# Patient Record
Sex: Male | Born: 1950 | Race: White | Hispanic: No | Marital: Married | Smoking: Never smoker
Health system: Southern US, Community
[De-identification: ages and names within clinical notes are randomized; demographics above are authoritative.]

## PROBLEM LIST (undated history)

## (undated) DIAGNOSIS — Z951 Presence of aortocoronary bypass graft: Secondary | ICD-10-CM

## (undated) DIAGNOSIS — D696 Thrombocytopenia, unspecified: Secondary | ICD-10-CM

## (undated) DIAGNOSIS — T4145XA Adverse effect of unspecified anesthetic, initial encounter: Secondary | ICD-10-CM

## (undated) DIAGNOSIS — M797 Fibromyalgia: Secondary | ICD-10-CM

## (undated) DIAGNOSIS — N400 Enlarged prostate without lower urinary tract symptoms: Secondary | ICD-10-CM

## (undated) DIAGNOSIS — G4730 Sleep apnea, unspecified: Secondary | ICD-10-CM

## (undated) DIAGNOSIS — I513 Intracardiac thrombosis, not elsewhere classified: Secondary | ICD-10-CM

## (undated) DIAGNOSIS — I10 Essential (primary) hypertension: Secondary | ICD-10-CM

## (undated) DIAGNOSIS — Z789 Other specified health status: Secondary | ICD-10-CM

## (undated) DIAGNOSIS — I509 Heart failure, unspecified: Secondary | ICD-10-CM

## (undated) DIAGNOSIS — I219 Acute myocardial infarction, unspecified: Secondary | ICD-10-CM

## (undated) DIAGNOSIS — E785 Hyperlipidemia, unspecified: Secondary | ICD-10-CM

## (undated) DIAGNOSIS — Z95810 Presence of automatic (implantable) cardiac defibrillator: Secondary | ICD-10-CM

## (undated) DIAGNOSIS — I2510 Atherosclerotic heart disease of native coronary artery without angina pectoris: Secondary | ICD-10-CM

## (undated) DIAGNOSIS — T8859XA Other complications of anesthesia, initial encounter: Secondary | ICD-10-CM

## (undated) DIAGNOSIS — I255 Ischemic cardiomyopathy: Secondary | ICD-10-CM

## (undated) DIAGNOSIS — I240 Acute coronary thrombosis not resulting in myocardial infarction: Secondary | ICD-10-CM

## (undated) HISTORY — DX: Benign prostatic hyperplasia without lower urinary tract symptoms: N40.0

## (undated) HISTORY — DX: Intracardiac thrombosis, not elsewhere classified: I51.3

## (undated) HISTORY — DX: Thrombocytopenia, unspecified: D69.6

## (undated) HISTORY — DX: Presence of automatic (implantable) cardiac defibrillator: Z95.810

## (undated) HISTORY — DX: Other specified health status: Z78.9

## (undated) HISTORY — PX: JOINT REPLACEMENT: SHX530

## (undated) HISTORY — PX: APPENDECTOMY: SHX54

---

## 1993-05-02 HISTORY — PX: OTHER SURGICAL HISTORY: SHX169

## 1999-04-16 ENCOUNTER — Encounter: Payer: Self-pay | Admitting: Neurological Surgery

## 1999-04-16 ENCOUNTER — Ambulatory Visit (HOSPITAL_COMMUNITY): Admission: RE | Admit: 1999-04-16 | Discharge: 1999-04-16 | Payer: Self-pay | Admitting: Neurological Surgery

## 2001-01-25 ENCOUNTER — Ambulatory Visit (HOSPITAL_BASED_OUTPATIENT_CLINIC_OR_DEPARTMENT_OTHER): Admission: RE | Admit: 2001-01-25 | Discharge: 2001-01-25 | Payer: Self-pay | Admitting: *Deleted

## 2001-05-02 HISTORY — PX: SPINAL FUSION: SHX223

## 2005-05-02 HISTORY — PX: CORONARY ARTERY BYPASS GRAFT: SHX141

## 2005-07-24 ENCOUNTER — Inpatient Hospital Stay (HOSPITAL_COMMUNITY): Admission: AD | Admit: 2005-07-24 | Discharge: 2005-08-03 | Payer: Self-pay | Admitting: Cardiology

## 2005-07-24 ENCOUNTER — Ambulatory Visit: Payer: Self-pay | Admitting: Cardiology

## 2005-07-26 ENCOUNTER — Encounter: Payer: Self-pay | Admitting: Vascular Surgery

## 2005-07-29 ENCOUNTER — Encounter: Payer: Self-pay | Admitting: Cardiology

## 2005-08-04 ENCOUNTER — Emergency Department (HOSPITAL_COMMUNITY): Admission: EM | Admit: 2005-08-04 | Discharge: 2005-08-04 | Payer: Self-pay | Admitting: Emergency Medicine

## 2005-08-11 ENCOUNTER — Ambulatory Visit: Payer: Self-pay | Admitting: Cardiology

## 2005-08-25 ENCOUNTER — Encounter (HOSPITAL_COMMUNITY): Admission: RE | Admit: 2005-08-25 | Discharge: 2005-11-23 | Payer: Self-pay | Admitting: Cardiology

## 2005-09-09 ENCOUNTER — Ambulatory Visit: Payer: Self-pay | Admitting: Cardiology

## 2005-09-20 ENCOUNTER — Ambulatory Visit: Payer: Self-pay | Admitting: *Deleted

## 2005-10-03 ENCOUNTER — Ambulatory Visit: Payer: Self-pay | Admitting: Cardiology

## 2005-10-04 ENCOUNTER — Emergency Department (HOSPITAL_COMMUNITY): Admission: EM | Admit: 2005-10-04 | Discharge: 2005-10-05 | Payer: Self-pay | Admitting: Emergency Medicine

## 2005-10-05 ENCOUNTER — Ambulatory Visit: Payer: Self-pay | Admitting: Cardiology

## 2005-10-18 ENCOUNTER — Ambulatory Visit: Payer: Self-pay | Admitting: Cardiology

## 2005-11-24 ENCOUNTER — Encounter (HOSPITAL_COMMUNITY): Admission: RE | Admit: 2005-11-24 | Discharge: 2006-01-10 | Payer: Self-pay | Admitting: Cardiology

## 2005-12-05 ENCOUNTER — Ambulatory Visit: Payer: Self-pay | Admitting: Cardiology

## 2006-04-12 ENCOUNTER — Ambulatory Visit: Payer: Self-pay | Admitting: *Deleted

## 2006-04-21 ENCOUNTER — Ambulatory Visit: Payer: Self-pay

## 2006-05-03 ENCOUNTER — Ambulatory Visit: Payer: Self-pay | Admitting: *Deleted

## 2006-07-03 ENCOUNTER — Ambulatory Visit: Payer: Self-pay | Admitting: Cardiology

## 2006-07-03 LAB — CONVERTED CEMR LAB
Cholesterol: 178 mg/dL (ref 0–200)
Direct LDL: 99.7 mg/dL
Total CHOL/HDL Ratio: 5.7
Triglycerides: 252 mg/dL (ref 0–149)
VLDL: 50 mg/dL — ABNORMAL HIGH (ref 0–40)

## 2006-07-07 ENCOUNTER — Ambulatory Visit: Payer: Self-pay | Admitting: Cardiology

## 2006-11-01 ENCOUNTER — Ambulatory Visit: Payer: Self-pay | Admitting: Cardiology

## 2006-11-01 LAB — CONVERTED CEMR LAB
ALT: 17 units/L (ref 0–53)
Cholesterol: 222 mg/dL (ref 0–200)
Direct LDL: 92.3 mg/dL
HDL: 30 mg/dL — ABNORMAL LOW (ref >39.0)
Total Bilirubin: 0.7 mg/dL (ref 0.3–1.2)
Total CHOL/HDL Ratio: 7.4
Total Protein: 7.3 g/dL (ref 6.0–8.3)
VLDL: 97 mg/dL — ABNORMAL HIGH (ref 0–40)

## 2006-11-08 ENCOUNTER — Ambulatory Visit: Payer: Self-pay | Admitting: Cardiology

## 2009-01-29 ENCOUNTER — Telehealth (INDEPENDENT_AMBULATORY_CARE_PROVIDER_SITE_OTHER): Payer: Self-pay | Admitting: *Deleted

## 2010-05-02 HISTORY — PX: CARDIAC CATHETERIZATION: SHX172

## 2010-05-02 HISTORY — PX: ICD IMPLANT: EP1208

## 2010-05-23 ENCOUNTER — Encounter: Payer: Self-pay | Admitting: Thoracic Surgery (Cardiothoracic Vascular Surgery)

## 2010-07-07 ENCOUNTER — Inpatient Hospital Stay (HOSPITAL_COMMUNITY)
Admission: EM | Admit: 2010-07-07 | Discharge: 2010-07-16 | DRG: 853 | Disposition: A | Discharge status: Home / Self Care | Payer: BC Managed Care – PPO | Source: Ambulatory Visit | Attending: Cardiology | Admitting: Cardiology

## 2010-07-07 ENCOUNTER — Emergency Department (HOSPITAL_COMMUNITY): Admission: EM | Admit: 2010-07-07 | Payer: BC Managed Care – PPO | Source: Home / Self Care

## 2010-07-07 DIAGNOSIS — E785 Hyperlipidemia, unspecified: Secondary | ICD-10-CM | POA: Diagnosis present

## 2010-07-07 DIAGNOSIS — Z7901 Long term (current) use of anticoagulants: Secondary | ICD-10-CM

## 2010-07-07 DIAGNOSIS — G4730 Sleep apnea, unspecified: Secondary | ICD-10-CM | POA: Diagnosis present

## 2010-07-07 DIAGNOSIS — I2582 Chronic total occlusion of coronary artery: Secondary | ICD-10-CM | POA: Diagnosis present

## 2010-07-07 DIAGNOSIS — I2589 Other forms of chronic ischemic heart disease: Secondary | ICD-10-CM | POA: Diagnosis present

## 2010-07-07 DIAGNOSIS — IMO0001 Reserved for inherently not codable concepts without codable children: Secondary | ICD-10-CM | POA: Diagnosis present

## 2010-07-07 DIAGNOSIS — I214 Non-ST elevation (NSTEMI) myocardial infarction: Principal | ICD-10-CM | POA: Diagnosis present

## 2010-07-07 DIAGNOSIS — IMO0002 Reserved for concepts with insufficient information to code with codable children: Secondary | ICD-10-CM | POA: Diagnosis present

## 2010-07-07 DIAGNOSIS — I2510 Atherosclerotic heart disease of native coronary artery without angina pectoris: Secondary | ICD-10-CM | POA: Diagnosis present

## 2010-07-07 DIAGNOSIS — Z951 Presence of aortocoronary bypass graft: Secondary | ICD-10-CM

## 2010-07-07 LAB — BASIC METABOLIC PANEL
Calcium: 8.3 mg/dL — ABNORMAL LOW (ref 8.4–10.5)
Chloride: 110 mEq/L (ref 96–112)
Creatinine, Ser: 0.72 mg/dL (ref 0.4–1.5)
GFR calc Af Amer: >60 mL/min (ref >60)
Sodium: 135 mEq/L (ref 135–145)

## 2010-07-07 LAB — COMPREHENSIVE METABOLIC PANEL
ALT: 15 U/L (ref 0–53)
Alkaline Phosphatase: 92 U/L (ref 39–117)
BUN: 6 mg/dL (ref 6–23)
Calcium: 8.2 mg/dL — ABNORMAL LOW (ref 8.4–10.5)
Chloride: 108 mEq/L (ref 96–112)
Creatinine, Ser: 0.82 mg/dL (ref 0.4–1.5)
GFR calc non Af Amer: >60 mL/min (ref >60)
Glucose, Bld: 116 mg/dL — ABNORMAL HIGH (ref 70–99)

## 2010-07-07 LAB — CARDIAC PANEL(CRET KIN+CKTOT+MB+TROPI)
CK, MB: 6.7 ng/mL (ref 0.3–4.0)
Relative Index: 5.6 — ABNORMAL HIGH (ref 0.0–2.5)

## 2010-07-07 LAB — POCT ACTIVATED CLOTTING TIME
Activated Clotting Time: 376 seconds
Activated Clotting Time: 164 seconds

## 2010-07-07 LAB — MRSA PCR SCREENING: MRSA by PCR: NEGATIVE

## 2010-07-07 LAB — CBC
HCT: 54.3 % — ABNORMAL HIGH (ref 39.0–52.0)
MCH: 29.6 pg (ref 26.0–34.0)
MCH: 30 pg (ref 26.0–34.0)
MCHC: 35.1 g/dL (ref 30.0–36.0)
MCV: 85.4 fL (ref 78.0–100.0)
MCV: 86.5 fL (ref 78.0–100.0)
Platelets: 158 10*3/uL (ref 150–400)
RDW: 14.5 % (ref 11.5–15.5)
RDW: 14.6 % (ref 11.5–15.5)
WBC: 6.2 10*3/uL (ref 4.0–10.5)

## 2010-07-07 LAB — LIPID PANEL
LDL Cholesterol: 104 mg/dL — ABNORMAL HIGH (ref 0–99)
VLDL: 35 mg/dL (ref 0–40)

## 2010-07-07 LAB — APTT: aPTT: 41 seconds — ABNORMAL HIGH (ref 24–37)

## 2010-07-07 LAB — PROTIME-INR
INR: 0.93 (ref 0.00–1.49)
INR: 1.04 (ref 0.00–1.49)

## 2010-07-07 LAB — MAGNESIUM: Magnesium: 2.2 mg/dL (ref 1.5–2.5)

## 2010-07-08 LAB — CARDIAC PANEL(CRET KIN+CKTOT+MB+TROPI)
CK, MB: 247.4 ng/mL (ref 0.3–4.0)
Relative Index: 10.3 — ABNORMAL HIGH (ref 0.0–2.5)
Total CK: 2406 U/L — ABNORMAL HIGH (ref 7–232)
Total CK: 1831 U/L — ABNORMAL HIGH (ref 7–232)
Troponin I: >100 ng/mL (ref 0.00–0.06)
Troponin I: 88.75 ng/mL (ref 0.00–0.06)
Troponin I: 46.79 ng/mL (ref 0.00–0.06)

## 2010-07-08 LAB — BASIC METABOLIC PANEL
BUN: 5 mg/dL — ABNORMAL LOW (ref 6–23)
Calcium: 8.5 mg/dL (ref 8.4–10.5)
Chloride: 107 mEq/L (ref 96–112)
Creatinine, Ser: 0.79 mg/dL (ref 0.4–1.5)
GFR calc Af Amer: >60 mL/min (ref >60)
Potassium: 3.7 mEq/L (ref 3.5–5.1)
Sodium: 137 mEq/L (ref 135–145)

## 2010-07-08 LAB — CBC
MCHC: 34.7 g/dL (ref 30.0–36.0)
MCV: 85.7 fL (ref 78.0–100.0)
RDW: 14.6 % (ref 11.5–15.5)

## 2010-07-08 LAB — POCT I-STAT, CHEM 8
Calcium, Ion: 1.03 mmol/L — ABNORMAL LOW (ref 1.12–1.32)
Chloride: 105 mEq/L (ref 96–112)
Glucose, Bld: 108 mg/dL — ABNORMAL HIGH (ref 70–99)
HCT: 58 % — ABNORMAL HIGH (ref 39.0–52.0)
Hemoglobin: 19.7 g/dL — ABNORMAL HIGH (ref 13.0–17.0)
TCO2: 22 mmol/L (ref 0–100)

## 2010-07-08 LAB — POCT CARDIAC MARKERS
CKMB, poc: 2.6 ng/mL (ref 1.0–8.0)
Myoglobin, poc: 224 ng/mL (ref 12–200)
Troponin i, poc: 0.06 ng/mL (ref 0.00–0.09)

## 2010-07-08 LAB — TSH: TSH: 1.377 u[IU]/mL (ref 0.350–4.500)

## 2010-07-08 LAB — MAGNESIUM: Magnesium: 2.2 mg/dL (ref 1.5–2.5)

## 2010-07-09 LAB — CBC
HCT: 47 % (ref 39.0–52.0)
Hemoglobin: 15.9 g/dL (ref 13.0–17.0)
MCH: 29.3 pg (ref 26.0–34.0)
MCHC: 33.8 g/dL (ref 30.0–36.0)
MCV: 86.7 fL (ref 78.0–100.0)
RDW: 14.7 % (ref 11.5–15.5)

## 2010-07-09 LAB — BASIC METABOLIC PANEL
CO2: 26 mEq/L (ref 19–32)
Calcium: 8.2 mg/dL — ABNORMAL LOW (ref 8.4–10.5)
Chloride: 105 mEq/L (ref 96–112)
Creatinine, Ser: 1.04 mg/dL (ref 0.4–1.5)
GFR calc Af Amer: >60 mL/min (ref >60)
Sodium: 137 mEq/L (ref 135–145)

## 2010-07-09 LAB — GLUCOSE, CAPILLARY: Glucose-Capillary: 82 mg/dL (ref 70–99)

## 2010-07-09 LAB — PROTIME-INR: INR: 1 (ref 0.00–1.49)

## 2010-07-09 NOTE — Procedures (Signed)
NAME:  [name] Hall, [name] Hall              ACCOUNT NO.:  [id number]  MEDICAL RECORD NO.:  [id number]           PATIENT TYPE:  I  LOCATION:  2907                         FACILITY:  [facility]  PHYSICIAN:  Thereasa Solo. Brittnay Pigman, M.D. DATE OF BIRTH:  [birth year]  DATE OF PROCEDURE:  07/07/2010 DATE OF DISCHARGE:                           CARDIAC CATHETERIZATION   INDICATIONS FOR TEST:  This 60 year old male who had had bypass surgery in 2007.  He began having severe chest pain around 11:45 a.m.  He presented to the emergency room at 1304.  His first EKG was done at 1340.  STEMI was called at 1348.  He was in the cath lab at 1357.  Wire was passed through the lesion at 1447 and balloon inflation at 1451.  This patient had difficult anatomy.  He had a high-grade lesion in the circumflex and a totally occluded LAD.  His EKG shows changes of an inferior lateral infarct.  It was difficult to find to ascertain which one was truly the culprit vessel.  An opinion from Dr. Consuelo Pandy was obtained.  At the time of his bypass surgery, he already had a totally occluded right coronary artery and he had a non-grafted circumflex.  I did not perform a ventriculogram in attempt to conserve contrast exposure.  COMPLICATIONS:  None.  EQUIPMENT:  A 6-French Judkins configuration catheters.  DESCRIPTION OF PROCEDURE:  The patient was prepped and draped in the usual sterile fashion, exposing the right groin.  Following local anesthetic with 1% Xylocaine, the Seldinger technique was employed and a 6-French introducer sheath was placed in the right femoral artery.  Left and right coronary arteriography and graft visualization was performed.  Because of disease in the circumflex and disease in the LAD, it was difficult to ascertain which area was responsible for the inferior lateral infarct.  It was finally decided that it was the LAD.  An internal mammary artery catheter was used to cannulate the internal mammary  artery and a RCB guide was used to cannulate the right vein graft.  RESULTS: 1. Hemodynamic monitoring.  Central aortic pressure was 141/87. 2. Coronary arteriography:  Left main was normal. 3. Circumflex.  A non-grafted vessel with mid 90% 12-mm long area of     narrowing and the vessel was only about to advance the 2-5 in     diameter. 4. LAD.  100% occluded in its midportion.  It was grafted. 5. Right coronary artery.  100% occluded very proximally and it was     grafted.  GRAFTS: 1. The graft to the diagonal was widely patent.  The diagonal was well     visualized and free of disease. 2. Vein graft to the RCA.  The graft was widely patent.  Both the PDA     and posterior lateral vessels were free of disease but the PDA was     relatively small. 3. Internal mammary artery to the LAD.  The internal mammary artery     was widely patent on first view which was a cusp injection, you     could see distal runoff but in reality runoff was small diagonal  branch.  When selective engagement was performed, you could see     that the LAD was 100% occluded distal to its anastomotic site and     distal to the small diagonal that comes off.  On the second     injection, the frank trickle slowed down what looked like a very     small LAD. 4. PCI with both high-grade stenosis in the LAD and in the circumflex     system, it was Genasis Zingale difficult to decide which vessel was truly     culprit.  The decision was finally made after discussing with Dr.     Consuelo Pandy to proceed on with emergency intervention to the LAD.  A 6-     [place] IM guide catheters used and a short Luge wire.  The patient     was given IV Angiomax and at the end of the procedure, received 60     mg of p.o. Effient.  A short Luge wire was placed down the internal     mammary artery well distal to the area of obstruction in the LAD     and there was actually flow down the LAD once our wire was placed.  Predilatation was  accomplished with a 2.0 x 15 Apex balloon, 2 inflations, 10 atmospheres for 32 seconds and 15 atmospheres for 32 seconds.  Stenting with a drug-eluting 2.25 x 20 stent was performed, making sure that both the proximal and distal segments of the obstructions were well covered, 15 atmospheres for 37 seconds and 16 atmospheres for 37 seconds.  Postdilatation using the [place] Quantum balloon 2.5 x 20 was performed at 14 atmospheres for 42 seconds.  The area that had been basically occluded preintervention now appeared to be normal with brisk TIMI 3 flow, previously TIMI 0 to TIMI 1.  At the stent site, everything was widely patent and normal, but the ongoing LAD and particularly the LAD traverse the apex of the heart.  It was diffusely diseased and small.  I did not feel that additional stenting in the most terminal portion of the LAD was that the patient's best of interest.  I was concerned of potential perforation.  I placed him on Integrilin for 18 hours following this intervention to make sure there is no residual reduction in distal runoff that could be related to thrombus.  He had not been on aspirin prior to his presentation.  He will need an echo to assess his LV function and he will need intervention to his circumflex prior to discharge.  I will start him on statin therapy, beta-blockers, aspirin, Effient, and he will be started on ACE inhibitors tomorrow if his BUN and creatinine remain normal.  I had a long and somewhat blunt discussion about the importance of taking his medications in the setting of a C-reactive protein that he knows is over 10 and having had bypass surgery.          ______________________________ Thereasa Solo Kamile Fassler, M.D.     ABL/MEDQ  D:  07/07/2010  T:  07/08/2010  Job:  [id number]  cc:   Nanetta Batty, M.D. Vianne Bulls, M.D.  Electronically Signed by Julieanne Manson M.D. on 07/09/2010 02:26:56 PM

## 2010-07-11 LAB — CBC
Hemoglobin: 15.1 g/dL (ref 13.0–17.0)
MCH: 30.2 pg (ref 26.0–34.0)
Platelets: 125 10*3/uL — ABNORMAL LOW (ref 150–400)
RBC: 5 MIL/uL (ref 4.22–5.81)
WBC: 6.9 10*3/uL (ref 4.0–10.5)

## 2010-07-11 LAB — BASIC METABOLIC PANEL
CO2: 27 mEq/L (ref 19–32)
Chloride: 97 mEq/L (ref 96–112)
GFR calc Af Amer: >60 mL/min (ref >60)
Potassium: 4.3 mEq/L (ref 3.5–5.1)

## 2010-07-11 LAB — DIFFERENTIAL
Basophils Relative: 0 % (ref 0–1)
Eosinophils Absolute: 0.1 10*3/uL (ref 0.0–0.7)
Lymphs Abs: 1.1 10*3/uL (ref 0.7–4.0)
Monocytes Relative: 14 % — ABNORMAL HIGH (ref 3–12)
Neutro Abs: 4.7 10*3/uL (ref 1.7–7.7)
Neutrophils Relative %: 68 % (ref 43–77)

## 2010-07-12 DIAGNOSIS — IMO0002 Reserved for concepts with insufficient information to code with codable children: Secondary | ICD-10-CM

## 2010-07-12 LAB — CBC
HCT: 44.6 % (ref 39.0–52.0)
Hemoglobin: 14.8 g/dL (ref 13.0–17.0)
MCH: 28.5 pg (ref 26.0–34.0)
MCV: 85.9 fL (ref 78.0–100.0)
RBC: 5.19 MIL/uL (ref 4.22–5.81)
WBC: 6.5 10*3/uL (ref 4.0–10.5)

## 2010-07-12 LAB — POCT ACTIVATED CLOTTING TIME: Activated Clotting Time: 376 seconds

## 2010-07-12 LAB — BASIC METABOLIC PANEL
CO2: 27 mEq/L (ref 19–32)
Chloride: 104 mEq/L (ref 96–112)
Creatinine, Ser: 1.01 mg/dL (ref 0.4–1.5)
GFR calc Af Amer: >60 mL/min (ref >60)
Potassium: 3.9 mEq/L (ref 3.5–5.1)
Sodium: 137 mEq/L (ref 135–145)

## 2010-07-12 LAB — APTT: aPTT: 33 seconds (ref 24–37)

## 2010-07-13 DIAGNOSIS — IMO0002 Reserved for concepts with insufficient information to code with codable children: Secondary | ICD-10-CM

## 2010-07-13 LAB — CBC
HCT: 45.4 % (ref 39.0–52.0)
Hemoglobin: 15.7 g/dL (ref 13.0–17.0)
RBC: 5.23 MIL/uL (ref 4.22–5.81)
RDW: 14 % (ref 11.5–15.5)
WBC: 6.1 10*3/uL (ref 4.0–10.5)

## 2010-07-13 LAB — BASIC METABOLIC PANEL
Chloride: 104 mEq/L (ref 96–112)
GFR calc non Af Amer: >60 mL/min (ref >60)
Glucose, Bld: 110 mg/dL — ABNORMAL HIGH (ref 70–99)
Potassium: 4.6 mEq/L (ref 3.5–5.1)
Sodium: 136 mEq/L (ref 135–145)

## 2010-07-14 LAB — COMPREHENSIVE METABOLIC PANEL
ALT: 13 U/L (ref 0–53)
Alkaline Phosphatase: 77 U/L (ref 39–117)
BUN: 6 mg/dL (ref 6–23)
Chloride: 107 mEq/L (ref 96–112)
Glucose, Bld: 96 mg/dL (ref 70–99)
Potassium: 4.3 mEq/L (ref 3.5–5.1)
Sodium: 135 mEq/L (ref 135–145)
Total Bilirubin: 1.3 mg/dL — ABNORMAL HIGH (ref 0.3–1.2)

## 2010-07-14 LAB — PROTIME-INR: INR: 1.15 (ref 0.00–1.49)

## 2010-07-14 LAB — CBC
HCT: 41.3 % (ref 39.0–52.0)
Hemoglobin: 14.1 g/dL (ref 13.0–17.0)
MCH: 29.3 pg (ref 26.0–34.0)
MCV: 85.7 fL (ref 78.0–100.0)
RBC: 4.82 MIL/uL (ref 4.22–5.81)
WBC: 6.2 10*3/uL (ref 4.0–10.5)

## 2010-07-14 NOTE — Procedures (Signed)
NAME:  [name] Hall, [name] Hall NO.:  [id number]  MEDICAL RECORD NO.:  [id number]           PATIENT TYPE:  I  LOCATION:  2926                         FACILITY:  [facility]  PHYSICIAN:  Nanetta Batty, M.D.   DATE OF BIRTH:  1950/06/24  DATE OF PROCEDURE:  07/12/2010 DATE OF DISCHARGE:                           CARDIAC CATHETERIZATION   CARDIAC CATHETERIZATION/PERCUTANEOUS CORONARY INTERVENTION AND STENT REPORT.  [name] Hall is a 60 year old gentleman, admitted on July 07, 2010, with an acute myocardial infarction.  He has had bypass surgery in the past. Dr. Clarene Duke cathed him revealing an occluded LAD after LIMA insertion and he intervened with a drug-eluting stent.  He had a total right with a patent vein to the distal right, patent sequential vein to what appeared to be a diagonal branch and a ramus branch and an ungrafted native circ with an 80-90% mid AV groove stenosis.  He presents now for staged intervention.  PROCEDURE DESCRIPTION:  The patient was brought to the second floor of [facility] in a postabsorptive state.  Premedicated with IV fentanyl and Versed.  His right groin was prepped and shaved in usual sterile fashion.  A 1% Xylocaine was used for local anesthesia.  A 6-French sheath was inserted into the right femoral artery using standard Seldinger technique.  A 6-French long right Judkins catheter was used to perform LIMA and LAD angiography to determine patency of the previously placed LAD stent.  A 6-French pigtail was used for left ventriculography at the end of the case.  A 6-French XB 3.5 guide catheter along with 0.014 x 190 Asahi soft wire and 2.0 x 12 Trek were used for predilatation of the AV groove circumflex.  PROCEDURE DESCRIPTION:  The patient was already on aspirin and Effient. He received an Angiomax bolus with an appropriate ACT.  Using a 6-French XB 3.5 guide catheter along with an 0.014 x 190 Asahi soft wire, the wire was  advanced down the native circumflex across the lesion into the distal vessel.  Predilatation was performed with a 2.0 x 12 Trek and stenting with a 2.25 x 14 Resolute drug-eluting stent at 12 atmospheres (2.36 mm) resulting in reduction of 90% stenosis to 0% residual with excellent flow.  Angiography of the LIMA and LAD stent revealed this to be widely patent.  Left ventriculography revealed an EF of 25% with what appeared to be an apical filling defect.  IMPRESSION:  Successful mid atrioventricular groove circumflex percutaneous coronary intervention and stenting using Resolute drug- eluting stent.  The patient does have severe left ventricular dysfunction which may necessitate use of a "LifeVest" for primary prevention of sudden death.  In addition, because of what appeared to be an apical filling defect, he may need a contrast echo to further define this and determine whether or not he requires Coumadin anticoagulation in addition to dual-antiplatelet therapy.  The left internal mammary artery stent was widely patent on angiography.  The guidewire and catheter were removed.  The sheath was sewn securely in place.  The patient left the lab in stable condition.  He will be gently hydrated overnight, and the  remainder of his questions addressing this cath note will be resolved by my rounding partners tomorrow.     Nanetta Batty, M.D.     JB/MEDQ  D:  07/12/2010  T:  07/13/2010  Job:  [id number]  cc:   Second Floor [facility]  Electronically Signed by Nanetta Batty M.D. on 07/14/2010 02:00:49 PM

## 2010-07-15 LAB — CBC
MCH: 28.4 pg (ref 26.0–34.0)
MCV: 85 fL (ref 78.0–100.0)
Platelets: 141 10*3/uL — ABNORMAL LOW (ref 150–400)
RBC: 4.79 MIL/uL (ref 4.22–5.81)
RDW: 13.7 % (ref 11.5–15.5)
WBC: 5.3 10*3/uL (ref 4.0–10.5)

## 2010-07-15 LAB — PROTIME-INR: Prothrombin Time: 18.8 seconds — ABNORMAL HIGH (ref 11.6–15.2)

## 2010-07-15 LAB — HEPARIN LEVEL (UNFRACTIONATED)
Heparin Unfractionated: <0.1 IU/mL — ABNORMAL LOW (ref 0.30–0.70)
Heparin Unfractionated: 0.52 IU/mL (ref 0.30–0.70)

## 2010-07-16 LAB — CBC
Hemoglobin: 13.6 g/dL (ref 13.0–17.0)
MCH: 29.8 pg (ref 26.0–34.0)
MCHC: 34.7 g/dL (ref 30.0–36.0)
MCV: 86 fL (ref 78.0–100.0)
RBC: 4.56 MIL/uL (ref 4.22–5.81)

## 2010-07-16 LAB — HEPARIN LEVEL (UNFRACTIONATED): Heparin Unfractionated: 0.45 IU/mL (ref 0.30–0.70)

## 2010-08-23 NOTE — Discharge Summary (Signed)
NAME:  [name] Hall, [name] Hall              ACCOUNT NO.:  [id number]  MEDICAL RECORD NO.:  [id number]           PATIENT TYPE:  I  LOCATION:  3737                         FACILITY:  [facility]  PHYSICIAN:  Ritta Slot, MD     DATE OF BIRTH:  Sep 28, 1950  DATE OF ADMISSION:  07/07/2010 DATE OF DISCHARGE:  07/16/2010                              DISCHARGE SUMMARY   DISCHARGE DIAGNOSES: 1. ST-elevation myocardial infarction July 06, 2005 treated with left     anterior descending drug-eluting stent via the left internal     mammary artery graft. 2. Staged circumflex stenting July 12, 2010. 3. History of coronary disease with coronary artery bypass grafting in     2007. 4. Ischemic cardiomyopathy with an ejection fraction of 25%. 5. 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 6. Treated dyslipidemia with a history of intolerance to higher dose     statins. 7. Past history of fibromyalgia. 8. History of sleep apnea with CPAP intolerance. 9. Past history of chronic fatigue syndrome. 10.Cellulitis and left upper extremity, the patient was treated with     IV antibiotics until July 15, 2010 when they were changed to p.o.  HOSPITAL COURSE:  Mr. [name] Hall is a 60 year old male who had bypass grafting in 2007.  He presented as an ST-elevation MI on July 07, 2010. His EKG showed ST elevation in aVF, lead II, and V2-V6.  He was taken to the cath lab urgently by Dr. Clarene Duke.  Catheterization revealed a patent LIMA to the LAD with total LAD occlusion after the graft insertion.  The native circumflex was ungrafted and had a 90% mid stenosis.  The native LAD was totalled in midvessel.  The SVG to the RCA was patent and the PDA and PLA were patent.  The graft to the diagonal was patent.  The patient underwent intervention to the LAD via the LIMA with a ION drug- eluting stent.  He had nonsustained V-tach postprocedure and was kept on amiodarone for a  couple days.  He tolerated this well.  His CKs peaked at 2406 with 247 MB and had a troponin of greater than 100.  He was watched in the CCU.  The plan was for restudy to possibly approach to circumflex in a few days.  He did have some transient hypotension post LAD stenting and we had to be careful with his medications.  He also developed left upper extremity cellulitis and was seen in consult by the ID service.  He was put on IV vancomycin which was continued through March 15.  He was restudied July 12, 2010.  The LAD stent was widely patent.  His EF was 25% with an apical filling defect.  The circumflex had an 80-90% mid stenosis and this was dilated and stented with a Integrity stent.  He was transferred to telemetry.  Echocardiogram done confirmed LV thrombus.  We kept the patient on IV heparin until his INR was therapeutic.  His INR at discharge is 2.36.  We kept him on IV antibiotics through the 15th.  We feel the patient can be  discharged on July 16, 2010.  We have arranged for him to have a LifeVest for 3 months after discharge.  At discharge, I was contacted by the care manager saying the patient's insurance company was denying part of his hospitalization.  I have contacted [employer] [place] and they will reportedly contact me to discuss this further.  I will keep the patient informed of this interaction.  Please see med rec for complete discharge medications.  LABS AT DISCHARGE:  His white count is 5.5, hemoglobin 13.6, hematocrit 39.2, platelets 161,000, INR 2.36.  Sodium 135, potassium 4.3, BUN 6, creatinine 0.81.  CKs peaked as noted above 2406, 247 MB and greater than 100 troponin.  Cholesterol is 170, triglycerides 177, HDL 34, LDL 104.  TSH is normal 1.37.  Liver functions were normal.  Hemoglobin A1c is 5.4.  EKG shows sinus rhythm, inferior Q-waves, septal Q-waves, and anterior T-wave inversion.  DISPOSITION:  The patient is discharged in stable  condition.  He has not had chest x-ray this admission, so we will go ahead and get one before discharge to be complete.  I have contacted [employer] [place] and left a message on their notification service to discuss their denial of this patient's hospitalization.     Abelino Derrick, P.A.   ______________________________ Ritta Slot, MD    LKK/MEDQ  D:  07/16/2010  T:  07/17/2010  Job:  [id number]  cc:   Thereasa Solo. Little, M.D. Nanetta Batty, M.D.  Electronically Signed by Corine Shelter P.A. on 07/30/2010 02:40:18 PM Electronically Signed by Ritta Slot MD on 08/23/2010 04:03:46 PM

## 2010-09-14 NOTE — Assessment & Plan Note (Signed)
[facility]                            CARDIOLOGY OFFICE NOTE   [name] Hall, Hall                     MRN:          [id number]  DATE:11/08/2006                            DOB:          1951-02-05    REASON FOR VISIT:  Cardiac followup.   HISTORY OF PRESENT ILLNESS:  [name] Hall returns for a routine visit.  He  reports recurrent episodes of fairly atypical chest discomfort, but  actually these episodes have improved recently.  We talked today about  diet and exercise and I reviewed his recent lipids showing total  cholesterol up to 222, triglycerides 483, HDL 30, and LDL 92.  He had  normal liver function tests.  He tells me that he simply has not been  able to tolerate Lipitor and is very worried about taking any statin  medications.  He reported problems with his memory.  His  electrocardiogram shows sinus bradycardia at 56 beats per minute.  He  continues to have problems with chronic fatigue and tells me that he was  actually diagnosed with sleep apnea through a sleep study approximately  6 weeks ago although he has not had titration for therapy yet.  I  encouraged him to follow through with this.   ALLERGIES:  NO KNOWN DRUG ALLERGIES.   PRESENT MEDICATIONS:  1. Aspirin 325 mg p.o. daily.  2. Bisoprolol 1.25 mg p.o. daily.  3. Vitamin D.  4. CoQ10.  5. Kelp.  6. DHEA 50 mg p.o. daily.  7. Multivitamin daily.  8. Creatine supplements.   REVIEW OF SYSTEMS:  As described in the history of present illness.   EXAMINATION:  Blood pressure is 120/72, heart rate is 62, weight is 212  pounds.  Patient is comfortable and in no acute distress.  NECK:  Reveals no elevated jugular venous pressure or loud bruits, no  thyromegaly is noted, no thyroid tenderness is noted.  LUNGS:  Clear without labored breathing.  CARDIAC:  Reveals a regular rate and rhythm without loud murmur or S3  gallop.  ABDOMEN:  Reveals no bruit, bowel sounds are present.  EXTREMITIES:  Show no significant pitting edema.   IMPRESSION AND RECOMMENDATION:  1. Multi-vessel coronary artery disease, status post coronary artery      bypass grafting in March of 2007 with a left internal mammary      artery to the left anterior descending, left radial to the first      diagonal and vein graft to the posterior descending.  Myoview in      December 2007 showed inferolateral scar with small degree of peri-      infarct ischemia and an ejection fraction of 45%.  Plan has been      medical therapy which we continue at this point, although has been      limited due to both medication intolerances and relatively low      blood pressure at baseline.  I have encouraged him to continue his      strategy of diet and weight loss, and also asked him to follow      through  with his previous testing for obstructive sleep apnea as      this may be also impacting his health.  I will have him follow up      with me over the next 6 months for symptom review.  2. Hyperlipidemia.  Patient is very concerned about statins in general      and would prefer not to take any.  I have already reviewed with him      the risk/benefit ratio of statin therapy in a patient with coronary      artery disease and he is comfortable with not using these      medications.  I will continue to discuss this with him.     [name] Sidle, MD  Electronically Signed    SGM/MedQ  DD: 11/08/2006  DT: 11/08/2006  Job #: [id number]

## 2010-09-15 ENCOUNTER — Ambulatory Visit (HOSPITAL_COMMUNITY)
Admission: RE | Admit: 2010-09-15 | Discharge: 2010-09-15 | Disposition: A | Discharge status: Home / Self Care | Payer: BC Managed Care – PPO | Source: Ambulatory Visit | Attending: Cardiovascular Disease | Admitting: Cardiovascular Disease

## 2010-09-15 DIAGNOSIS — I219 Acute myocardial infarction, unspecified: Secondary | ICD-10-CM | POA: Insufficient documentation

## 2010-09-17 NOTE — Assessment & Plan Note (Signed)
[facility]                            CARDIOLOGY OFFICE NOTE   JASMEET, GEHL                     MRN:          [id number]  DATE:05/03/2006                            DOB:          [birth year]    HISTORY OF PRESENT ILLNESS:  Mr. Boeke is a 60 year old male patient  followed by Dr. Diona Browner who suffered a non ST elevation myocardial  infarction March of 2007, followed by coronary artery bypass grafting  times 4, who I saw on April 12, 2006, with a chief complaint of  blurry vision. Please see that note for complete details. The patient  complained of some fatigue as well as chronic dyspnea on exertion and  occasional chest aches. We had set him up for an CardioNet event  monitor. He had had some postop atrial fibrillation. So far, the  CardioNet monitor has shown only sinus rhythm and sinus tachycardia with  rare PVCs. The patient had also had some carotid stenosis noted  prebypass. A repeat carotid Doppler revealed 0 to 39% bilateral ICA  stenosis. Due to the patient's fatigue, we decreased his bisoprolol even  further to 1.25 mg a day. We also set him up for stress test. The stress  test was done December 21. He walked for 7 minutes. He denied any chest  pain. His EF returned at 45% and he had noted inferolateral scar with a  small degree of periinfarction ischemia. The patient returns today for  followup. He notes that he feels better since we decreased his  bisoprolol dose.  He has not had any further episodes of blurry vision.  I did ask him to go see and optometrist or ophthalmologist at our last  visit, but he has not done that yet. He denies any symptoms consistent  with his previous angina. He denies any chest pressure or tightness. He  denies any significant changes in his shortness of breath.  His  breathing is actually improved since his bypass.  He denies orthopnea,  paroxysmal nocturnal dyspnea. He denies any syncope.   CURRENT MEDICATIONS:  1. Aspirin 325 mg daily.  2. Bisoprolol 1.25 mg daily.  3. Fish oil  4. Vitamin D.  5. Coenzyme Q10.  6. Glucosamine chondroitin.  7. Red yeast.  8. Wob enzyme.  9. Dulcolax.   PHYSICAL EXAMINATION:  He is a well-nourished, well-developed male, in  no acute distress. Blood pressure is 107/67, pulse 61, weight 210  pounds.  HEENT:  Unremarkable.  NECK:  Without JVD.  CARDIAC:  S1, S2, regular rate and rhythm.  LUNGS:  Clear to auscultation.  ABDOMEN:  Soft, nontender.  EXTREMITIES:  Without edema.   IMPRESSION:  1. Blurry vision - resolved.  2. Fatigue - improved.  3. Coronary artery disease.      a.     Status post non ST elevation myocardial infarction March       2005.      b.     Coronary artery bypass graft with left internal mammary       artery to left anterior descending, left radial artery to the  first diagonal,  vein graft to the posterior descending artery       March 2007.  4. Ischemic cardiomyopathy with a ejection fraction  of 40% at the      catheterization  prior to bypass.      a.     Ejection fraction 45% on recent nuclear study.  5. Dyslipidemia.  6. History of postoperative atrial fibrillation.  7. 0 to 39% bilateral ICA stenosis.   PLAN:  The patient presents to the office today for followup. He  initially presented with complaints of blurry vision. He has not had any  recurrence of this. He also complained of fatigue and we decreased his  bisoprolol dose even more. He is currently taking 1.25 mg a day. I am  not exactly sure how much this dose is doing for him.  However, he does  feel better on this dose. His fatigue is improved. His carotid Doppler's  do not show any significant ICA stenosis. So far, his CardioNet monitor  is not showing any significant arrhythmias. His stress test is low risk.  I have reviewed this with Dr. Corinda Gubler today who agrees. At this point in  time, we will continue medical therapy. If he has a  recurrence of his  blurry vision or worsening fatigue, we can certainly try to take him off  of the bisoprolol  altogether. For his LV dysfunction, he would  ultimately benefit from a beta blocker and ACE inhibitor, but with his  symptoms of fatigue and his lower blood pressures,  I am not sure he is  going to tolerate both of those. I did encourage him to get back on his  statin today. He will discontinue his red yeast rice extract and start  on Lipitor 20 mg q.h.s.. We will check lipids and LFTs in 6 to 8 weeks  and have his follow up with Dr. Simona Huh in the next couple of  months.      Tereso Newcomer, PA-C  Electronically Signed      Cecil Cranker, MD, [facility]  Electronically Signed   SW/MedQ  DD: 05/03/2006  DT: 05/03/2006  Job #: [id number]

## 2010-09-17 NOTE — Op Note (Signed)
NAME:  [name] [name], [name] [name] NO.:  [id number]   MEDICAL RECORD NO.:  [id number]          PATIENT TYPE:  INP   LOCATION:  2303                         FACILITY:  [facility]   PHYSICIAN:  Salvatore Decent. Dorris Fetch, M.D.DATE OF BIRTH:  12-01-1950   DATE OF PROCEDURE:  07/27/2005  DATE OF DISCHARGE:                                 OPERATIVE REPORT   PREOPERATIVE DIAGNOSIS:  Severe three-vessel coronary disease status post  myocardial infarction.   POSTOPERATIVE DIAGNOSIS:  Severe three-vessel coronary disease status post  myocardial infarction.   DESCRIPTION OF PROCEDURE:  Median sternotomy, extracorporeal circulation,  coronary artery bypass grafting x4 (left internal mammary artery to left  anterior descending, left radial artery to first diagonal, saphenous vein  graft sequentially to posterior descending and posterior lateral),  endoscopic vein harvest right thigh, open left radial artery harvest,  placement of On-Q local anesthetic catheters.   SURGEON:  Salvatore Decent. Dorris Fetch, M.D.   FIRST ASSISTANT:  Kerin Perna, M.D.   SECOND ASSISTANT:  Jerold Coombe, P.A.   ANESTHESIA:  General.   FINDINGS:  Normal Allen's test.  Good pulse in left radial with proximal  occlusion, excellent backbleeding from distal radial stump.  Good-quality  conduits, good quality targets, no graftable targets in the circumflex  distribution, patchy anterior scar.  Acute infarct in the anterolateral  wall.   CLINICAL NOTE:  [name] [name] is a 60 year old gentleman who was admitted on  Sunday, July 24, 2005, after presenting late in the course of an out of the  hospital myocardial infarction.  He was having post infarct unstable angina.  He was stabilized medically and then underwent cardiac catheterization which  showed critical disease in his LAD and first diagonal which is a large  anterolateral branch which had a significant thrombus.  The right coronary  was totaled.  There was  moderate disease in the left circumflex but this  vessel did not appear graftable.  The patient was advised to undergo  coronary artery bypass grafting.  Because the patient received Plavix, he  was given the option of waiting for the Plavix effect to wash out but with  the attendant risk of reinfarction versus of proceeding with surgery with  high risk of bleeding, the relative advantages well as risks of using  aprotinin to decrease preoperative bleeding were discussed in detail with  the patient.  We ultimately decided to proceed with surgery with the use of  aprotinin to minimize his need for blood products.   DESCRIPTION OF PROCEDURE:  [name] [name] was brought the preoperative holding  area on July 27, 2005.  There lines were  placed by the anesthesia service  to monitor arterial, central venous and pulmonary arterial pressure.  Intravenous antibiotics were administered.  His Allen's test was repeated  with pulse oximetry on the index finger.  There was a transient loss of  signal but then immediate rebound with good signal confirming the results of  the preoperative assessment by the vascular lab and the decision was made to  proceed with left radial artery harvest for grafting of the anterior lateral  diagonal vessel.  The patient was taken to the operating room, anesthetized  and intubated.  A Foley catheter was placed.  The chest, abdomen and legs  and left arm were prepped and draped in usual fashion.   An incision was made ver the left wrist was volar aspect overlying the  radial pulse was carried through the skin and subcutaneous tissue.  The  sheath overlying the radial artery was incised.  A short segment of the  radial artery was dissected out with proximal occlusion.  There was a good  pulse distally, and the decision was made to harvest the radial artery.  The  incision then was extended to below the antecubital fossa, and the radial  artery was harvested using standard  open technique using a Harmonic scalpel.  The radial artery was a good quality vessel.  Heparin 5000 units was  administered during the harvest of the radial artery, and the vessel was  divided distally.  There was excellent backbleeding from the distal stump.  Both the proximal and distal stumps were suture ligated with 4-0 Prolene  sutures.  After achieving hemostasis, the wound was closed in two layers  with running 3-0 Vicryl subcutaneous suture and a 3-0 Vicryl subcuticular  suture.   Simultaneously an incision in the medial aspect the right leg, and the  greater saphenous vein was harvested from the right thigh endoscopically.  It was a good quality vessel.   Next, a median sternotomy was performed.  The left internal mammary artery  was harvested using standard technique.  It was a good-quality vessel. The  remaining full heparin dose was given during the takedown of the left  mammary artery.  There was Was excellent flow through the cut end of the  vessel when the mammary was divided.   The pericardium was opened.  The ascending aorta was inspected.  There was  no evidence of atherosclerotic disease.  The aorta was cannulated via  concentric two Ethibond pledgeted pursestring sutures.  A dual stage venous  cannula placed via pursestring suture in the right atrial appendage.  Cardiopulmonary bypass was instituted, and the patient was cooled to 32  degrees Celsius.  The coronary arteries were inspected and anastomotic sites  were chosen for future reference.  There were no graftable targets in the  circumflex distribution.  The conduits were inspected and cut to length.  A  foam pad was placed in the pericardium to protect the left phrenic nerve.  A  temperature probe was placed in the myocardial septum.  A cardioplegia  cannula was placed in the ascending aorta.   The aorta was cross clamped.  The left ventricle was emptied via aortic root vent.  Cardiac arrest was achieved  with a combination of cold antegrade and  retrograde blood cardioplegia and topical iced saline.  After achieving a  complete diastolic arrest and adequate myocardial septal cooling, the  following distal anastomoses were performed.   First, a reverse saphenous vein graft was placed sequentially to the  posterior descending and posterior lateral branch of the right coronary.  The posterior descending was a 1.3 mm vessel.  The posterior lateral was 1.5  mL vessel.  Both were good quality at the site of the anastomosis.  A side-  to-side anastomosis was performed to the posterior descending and an end-to-  side to the posterior lateral.  Both were performed with running 7-0 Prolene  sutures.  Each anastomosis was probed proximally and distally.  At its  completion, cardioplegia was administered down the vein graft.  There was  good flow and good hemostasis.   Next, the distal end of the left radial artery was beveled and anastomosed  end-to-side to the first diagonal branch of the left anterior descending.  This was the infarct vessel which have a large thrombus within it.  The  vessel was of good quality at the site of the anastomosis.  There was an  infarct in the myocardium in the distribution of this vessel.  The diagonal  was a 2-mm good-quality target.  The radial was good quality conduit.  It  was anastomosed end-to-side with a running 8-0 Prolene suture probed easily.  The graft flushed easily.  Cardioplegia was administered via the aortic root  and the vessel was allowed to back bleed to de-air the radial artery.   Next, the left internal mammary artery was brought through a window in the  pericardium.  The distal end was spatulated and then was anastomosed end-to-  side to the LAD.  The LAD was 2 mm good-quality target.  The mammary was a  good quality conduit.  The anastomosis was performed with a running 8-0  Prolene suture.  At the completion of the mammary to LAD  anastomosis,  bulldog clamps were briefly removed to inspect for hemostasis and immediate  rapid septal rewarming was noted.  The bulldog clamp was replaced and the  mammary pedicle was tacked to the epicardial surface of the heart.   Additional cardioplegia was administered.  The vein graft and radial artery  were cut to length proximally.  Cardioplegic cannulas were removed from the  ascending aorta.  The proximal radial artery anastomosis was performed  directly to the aorta as the radial was a good size vessel and the aorta was  thin-walled, pliable and normal.  The anastomosis was performed with a  running 7-0 Prolene suture.  The proximal vein graft anastomosis was  performed with a running 6-0 Prolene suture.  At the completion of the final  proximal anastomoses the patient was placed Trendelenburg position.  De-  airing maneuvers were performed.  Lidocaine was administered, and the aortic  crossclamp was removed.  Total crossclamp time was 69 minutes.  While the patient was being rewarmed, all proximal and distal anastomoses  were inspected for hemostasis.  Epicardial pacing wires were placed on the  right ventricle, right atrium.  When Mr. Outten had been rewarmed to a core  temperature of 37 degrees Celsius, a low-dose dopamine infusion was  initiated.  He then was weaned from cardiopulmonary bypass without  difficulty on the first attempt.  The Total bypass time was 105 minutes.  The initial cardiac index was greater than 2 liters per minute per m2.  The  patient remained hemodynamically stable throughout post bypass period.   A test dose of  protamine was administered as well tolerated.  The atrial  and aortic cannulae were removed.  The remainder of the protamine was  administered without incident.  The chest irrigated with 1 liter of warm  normal saline containing 1 gram of vancomycin.  Hemostasis was achieved.  The pericardium was reapproximated with interrupted 3-0 silk  sutures  cannulates without tension.  The sternum was closed with interrupted heavy  gauge double stainless steel wires.  The remaining incisions were closed in  a standard fashion.  Subcuticular closure was used for the skin.  All  sponge, needle and counts were correct at the end of the procedure.  The  patient was taken from the operating room to the surgical intensive care  unit in critical but stable condition.           ______________________________  Salvatore Decent Dorris Fetch, M.D.     SCH/MEDQ  D:  07/27/2005  T:  07/29/2005  Job:  [id number]

## 2010-09-17 NOTE — Assessment & Plan Note (Signed)
[facility]                            CARDIOLOGY OFFICE NOTE   KEISUKE, HOLLABAUGH                     MRN:          [id number]  DATE:07/07/2006                            DOB:          21-Nov-1950    REASON FOR VISIT:  Cardiac followup.   HISTORY OF PRESENT ILLNESS:  I last saw Mr. Pursell in the office back in  August.  He has been seen several times since then, most recently by  Tereso Newcomer, and has undergone a variety of tests.  His symptoms have  included occasional chest pain, dyspnea, as well as some visual changes.  Outpatient monitoring showed no clear dysrhythmias beyond occasional  premature ventricular complexes.  He had a carotid Duplex scan revealing  a 0-39% bilateral internal carotid artery stenoses.  He also underwent  an exercise Myoview that demonstrated inferolateral scar with small  degree of peri-infarct ischemia and ejection fraction of 45%.  I  reviewed all this with him today and also his recent lipids, including  an LDL of 99, HDL 31, triglycerides 252 and cholesterol of 178.  He also  apparently had another lipid profile obtained early in February that I  was not aware of at another practice, showing an LDL of 89, HDL 28, high-  sensitivity C-reactive protein of 4.1, homocysteine of 11.5.  Today's  electrocardiogram  demonstrates sinus rhythm with left atrial  enlargement, nonspecific T-wave changes.  Today I spoke with him about  diet and exercise and also reviewed with him indications for statin  therapy.  Tereso Newcomer had him try back on Lipitor 20 mg daily although  he states that he had some muscle soreness and weakness with this.  He  is interested in trying it again, however I asked him to go with a lower  dose and if he is successful we will follow up lipids 8 weeks down the  road.  Otherwise, I think a trial of a different statin preparation  altogether would be considered.   ALLERGIES:  No known drug  allergies.   CURRENT MEDICATIONS:  1. Aspirin 325 mg p.o. daily.  2. Bisoprolol 1.25 mg p.o. daily.  3. Omega 3 fish oil supplements 1000 mg p.o. t.i.d.  4. Vitamin D.  5. Co-Q 10.  6. Niaspan 1000 mg p.o. daily.  7. B-50 complex.  8. DHEA 50 mg p.o. daily.  9. Red Yeast Rice extract.  10.Vitamin C supplement.   REVIEW OF SYSTEMS:  As per history of present illness.  He has not had  any major bleeding problems.  Also denies no recent visual problems.  No  palpitations or syncope.   PHYSICAL EXAMINATION:  Weight is 210 pounds, blood pressure 100/76,  heart rate is 65. The patient is comfortable, in no acute distress.  HEENT:  Conjunctiva is normal, pharynx clear.  NECK:  Supple, without elevated jugular venous pressure, without bruits.  No thyromegaly.  LUNGS:  Clear, without labored breathing at rest.  CARDIAC:  Regular rate and rhythm, without murmurs, rubs or gallops.  EXTREMITIES:  No significant pitting edema.   IMPRESSION/RECOMMENDATIONS:  1. Coronary  artery disease status post previous non-ST elevation      myocardial infarction in March of 2005, with subsequent coronary      artery bypass grafting including a LIMA to the left anterior      descending, left radial to the first diagonal, and vein graft to      the posterior descending.  He has an ischemic cardiomyopathy with      an ejection fraction in the 45% range and recent Myoview suggests      scar with mild peri-infarct ischemia.  He is not manifesting any      progressive angina and I spoke with him today about gradually      increasing his exercise regimen, with a continued eye towards diet      and weight loss.  From the perspective of his medical regimen, he      has had significant difficulty tolerating beta blocker therapy due      to fatigue and is on a minimal dose of bisoprolol at this time.      His blood pressure also limits use of an ACE inhibitor despite his      left ventricular dysfunction.  I will  plan to see him back over the      next few months for symptom review, at which time we will follow up      on his lipids and liver function tests, assuming he is able to      resume Lipitor 10 mg daily.  I also told him he could try to take      Lipitor 10 mg every other day to see if this would help.  2. Further plans to follow.     Jonelle Sidle, MD  Electronically Signed    SGM/MedQ  DD: 07/07/2006  DT: 07/07/2006  Job #: [id number]

## 2010-09-17 NOTE — H&P (Signed)
NAME:  [name] [name], [name] [name] NO.:  [id number]   MEDICAL RECORD NO.:  [id number]          PATIENT TYPE:  EMS   LOCATION:  ED                           FACILITY:  [facility]   PHYSICIAN:  Learta Codding, M.D. LHCDATE OF BIRTH:  07-Jun-1950   DATE OF ADMISSION:  07/24/2005  DATE OF DISCHARGE:                                HISTORY & PHYSICAL   REASON FOR ADMISSION:  Evaluation of non-ST elevation myocardial infarction  in a 60 year old male.   HISTORY OF PRESENT ILLNESS:  The patient is a 60 year old male with no prior  cardiac history.  The patient reported onset of substernal chest pain on  Friday after playing golf.  He also reported some shortness of breath.  Interestingly, also he noted some visual changes that evening without any  focal deficits.  The patient also denies any presyncope or syncope at that  time.  He was then awakened around 4 a.m. on Saturday with severe heavy  substernal crushing chest pain with radiation toward the back and shoulders.  This was associated with diaphoresis.  The patient stated that he could not  get comfortable.  The pain slowly abated after approximately 1 hour.  Throughout the course of Saturday, he continued to have off and on  substernal chest pressure, albeit, not as severe as the episode during the  nighttime.  His wife prompted him to go to the emergency room, but he felt  that things were getting better.   Today, the patient stated that he had still off and on chest pain and  describes the intensity as a 3/10.  He has also been feeling weak over the  last several days with worsening exertional symptoms of chest pressure.  The  patient then presented himself to the emergency room where his  electrocardiogram showed nonspecific ST T wave changes.  Point of care  markers, however, demonstrated significant elevated troponin, the first set  being 20 and the second set being 6.95.  In addition a CK-MB was markedly  elevated on both  samples.  In the emergency room, the patient still reports  chest pressure rated about 2/10.  He denies any shortness of breath,  however, or diaphoresis.   ALLERGIES:  No known drug allergies.   PAST MEDICAL HISTORY:  1.  History of chronic fatigue syndrome.  The patient is under treatment      with testosterone.  2.  History of back surgery with chronic back pain.   SOCIAL HISTORY:  The patient is currently on disability.  He denies any  tobacco use or alcohol use.  He lives with his wife in [place].   FAMILY HISTORY:  Noncontributory.   MEDICATIONS:  The patient was not taking any medications prior to hospital  admission short of testosterone as outlined above.  He also took Cialis  approximately 8 days ago.   REVIEW OF SYSTEMS:  As per HPI.  No nausea and vomiting, no fever and  chills.  No dysuria or frequency.  No melena or hematochezia.  No abdominal  pain.  No claudication symptoms.  Positive for chest pain  as outlined above,  but no orthopnea or PND.  No palpitations or syncope.  Visual changes as  noted above occurring on Friday evening.  No definite focal deficits.   PHYSICAL EXAMINATION:  VITAL SIGNS:  Blood pressure 115/75, heart rate is 75  beats per minute, temperature is afebrile.  GENERAL:  A well-nourished white male in no apparent distress, somewhat pale  appearing.  HEENT:  Pupils isochoric, conjunctivae clear.  NECK:  Supple, normal carotid upstroke, no carotid bruits.  No abnormal  jugular reflux.  LUNGS:  Clear breath sounds bilaterally.  HEART:  Regular rate and rhythm, normal S1 and S2.  No murmurs, rubs, or  gallops.  ABDOMEN:  Soft and nontender with no rebound or guarding.  Good bowel  sounds.  EXTREMITIES:  No cyanosis, clubbing, or edema.  NEUROLOGY:  The patient is alert, oriented, and grossly nonfocal.   LABORATORY DATA:  Currently all that is available is point of care markers  as outlined above.  12-lead electrocardiogram shows normal  sinus rhythm with  rightward axis and nonspecific ST T wave changes.  Chest x-ray is still  pending.   PROBLEM LIST:  1.  Non-ST elevation myocardial infarction.      1.  Positive cardiac troponin markers.      2.  Nonspecific EKG changes.      3.  Residual substernal chest pain without heart failure symptoms.  2.  Chronic fatigue syndrome.  3.  Dyslipidemia, untreated.   PLAN:  The patient appears to have suffered a non-ST elevation myocardial  infarction.  He became pain-free in the emergency room with the addition of  aspirin, Plavix, heparin, nitroglycerin, and beta blocker to his medical  regimen.   I discussed with the patient and his wife the need to proceed with a cardiac  catheterization.  I also expressed my concern about the patient's chronic  fatigue syndrome and what this could represent underlying significant  coronary artery disease.   The patient will need to undergo an urgent cardiac catheterization if he has  ongoing substernal chest pain unresponsive to his medical therapy.   Of note, is that the patient did receive in the emergency room, 600 mg of  Plavix in addition to IV heparin bolus and IV maintenance drip.  He was  given a small dose of IV beta blocker at 2.5 and placed on nitroglycerin at  6 mL.  The plan is to proceed with catheterization tomorrow.      Learta Codding, M.D. [facility]  Electronically Signed     GED/MEDQ  D:  07/24/2005  T:  07/25/2005  Job:  [id number]   cc:   Macarthur Critchley. Shelva Majestic, M.D.  Fax: [phone]

## 2010-09-17 NOTE — Assessment & Plan Note (Signed)
[facility]                              CARDIOLOGY OFFICE NOTE   [name], [name]                     MRN:          [id number]  DATE:12/05/2005                            DOB:          August 22, 1950    REASON FOR VISIT:  Routine cardiac follow up.   HISTORY OF PRESENT ILLNESS:  [name] [name] was in the office last June.  His  cardiac history is well outlined in the previous note from June 19.  Overall, he states that he has gotten somewhat stronger recently.  He did  complete cardiac rehabilitation.  Symptomatically, he has experienced a left  sided lower thoracic discomfort a few weeks ago that bothered him at night,  although, this has improved significantly.  He has had no significant cough  or fevers.  He is not reporting any anginal chest pain or dyspnea.  He tells  me that in the last four weeks he stopped Lipitor given concerns about  memory deficits and also general weakness.  We have discussed the risks and  benefits of Statin therapy in detail.  At this point, he is comfortable  taking some plant sterile supplements.  I would like to see what his  cholesterol profile does down the road before reconsidering a lower dose  Statin medication.  He is otherwise tolerating aspirin and low-dose  bisoprolol.  I spoke with him today about cardiovascular disease the  benefits of diet and exercise.   ALLERGIES:  No known drug allergies.   MEDICATIONS:  1.  Aspirin 325 mg p.o. daily.  2.  Bisoprolol 2.5 mg p.o. daily.  3.  CholestOff.  4.  Vitamin supplements.   REVIEW OF SYSTEMS:  As described in history of present illness.   PHYSICAL EXAMINATION:  VITAL SIGNS:  Blood pressure today is 120/77, heart  rate 55 and regular, weight 203 pounds.  NECK:  No elevated jugular venous pressure.  LUNGS:  Clear without rub or labored breathing.  CARDIAC:  Regular rate and rhythm without murmurs, gallops, rubs.   IMPRESSION/RECOMMENDATIONS:  1.   Multiple vessel coronary artery disease status post non-ST elevation      myocardial infarction back in March 2007.  The patient is status post      coronary artery bypass grafting as outlined.  The plan at this point      would be to continue medical therapy with an eye towards increasing      aerobic exercise.  I will plan to see him back for symptom review over      the next six months.  2.  Hyperlipidemia.  LDL cholesterol down from 213 to 64 on high dose      Lipitor.  At this point, we will have a Statin holiday and follow up      lipid profile over the next three months with other measures as      discussed above.  We plan to revisit this going forward.  3.  Mr.  [name] is fairly interested in supplements and alternative      approaches to medical care.  He reads  quite a bit and generally prefers      to be off of medications.  I have spoken to him about this on multiple      occasions, and we will continue to try to work together as a team in his      health care.  I have made my concerns known to him.  4.  Ultimately, follow up of left ventricular function is indicated.  His      ejection fraction was in the 40% range prior to bypass surgery.  Can      consider a follow up echocardiogram in his next visit.                                Jonelle Sidle, MD    SGM/MedQ  DD:  12/05/2005  DT:  12/05/2005  Job #:  [id number]

## 2010-09-17 NOTE — Assessment & Plan Note (Signed)
[facility]                            CARDIOLOGY OFFICE NOTE   TINA, TEMME                     MRN:          [id number]  DATE:04/12/2006                            DOB:          [birth year]    CARDIOLOGIST:  Jonelle Sidle, M.D.   HISTORY OF PRESENT ILLNESS:  [name] [name] is a 60 year old male patient,  followed by Dr. Diona Browner with the history of non-ST-elevation myocardial  infarction, March of 2000, followed by CABG times four, who I spoke to  over the telephone last night.  He called the answering service with  some complaints of blurry vision.  Please see that note for complete  details.  Briefly, he has noted these symptoms about three times since  his bypass surgery.  He also had it a few times before his heart attack.  He associates these symptoms somewhat with his heart disease and is  concerned.  Basically what he has is a distortion of his vision in  bilateral eyes.  He says it is like looking through a prism.  He  denies any loss of vision or symptoms consistent with amaurosis fugax.  He notes that the symptoms last about an hour at a time.  He does note  that his last episode, yesterday, was preceded by difficulty sleeping  the night before.  He was quite tired yesterday.  He also had played  golf recently.  He does have some chest soreness and thinks it is  probably related to that.  He denies any chest discomfort like what he  had with his NSTEMI in March 2007.  He denies any syncope, denies any  near-syncope, although while he has his visual changes, he feels  unusual.  He has chronic fatigue and does note dyspnea on exertion,  but this is much improved since his bypass surgery.  He denies orthopnea  or paroxysmal nocturnal dyspnea.  He denies any chest heaviness or  tightness, arm or jaw discomfort.  He denies pleuritic chest pain, lower  extremity edema, palpitations.  He does, however, note his heartbeat,  from time to  time, especially at night, and can actually hear it.   CURRENT MEDICATIONS:  1. Aspirin 325 mg daily.  2. Bisoprolol 2.5 mg daily.  3. Fish oil.  4. Vitamin D.  5. Coenzyme Q10.  6. Glucosamine/chondroitin.  7. CholestOff.   ALLERGIES:  No known drug allergies.   SOCIAL HISTORY:  Denies any tobacco abuse.   REVIEW OF SYSTEMS:  Please see HPI.  Denies any fevers, chills, melena,  hematochezia, hematuria, dysuria, symptoms consistent with TIAs.  Rest  of review of systems is negative.   PHYSICAL EXAMINATION:  He is well-nourished, well-developed.  Blood  pressure 109/70, pulse 56, weight 207 pounds.  HEENT:  Unremarkable.  NECK:  Without JVD.  VASCULAR: Carotids without bruits bilaterally.  CARDIAC:  Normal S1, S2, regular rate and rhythm.  LUNGS:  Clear to auscultation bilaterally, without wheezing, rhonchi or  rales.  ABDOMEN:  Soft, nontender, with normal bowel sounds, no organomegaly.  EXTREMITIES:  Without edema.  Calves are soft and nontender.  SKIN:  Warm and dry.  NEUROLOGIC:  He is alert and oriented times three.  Cranial nerves II  through XII are grossly intact.  Strength is 5/5 and equal in all  extremity and axial groups.  Gait is normal.   Electrocardiogram reveals sinus rhythm with a heart rate of 53, normal  axis, no acute changes.   IMPRESSION:  1. Blurry vision.  2. Dyspnea on exertion, chronic.  3. Fatigue.  4. Coronary artery disease.      a.     Status post non-ST-elevation myocardial infarction, March       2005.      b.     Status post CABG with LIMA to LAD, left radial artery to       first diagonal, vein graft to posterior descending posterolateral       artery.      c.     Ejection fraction 40% at catheterization prior to his       bypass.  5. Dyslipidemia.  6. History of postoperative atrial fibrillation, currently maintaining      sinus rhythm.  7. History of back surgery.  8. Erectile dysfunction.  9. 40-60% bilateral internal carotid  artery stenosis at pre-CABG      Dopplers.  10.Atypical chest pain   PLAN:  The patient presents to the office today with complaints of  visual disturbances.  These are quite unusual and do not sound like  symptoms of amaurosis fugax or TIAs.  The patient did have some carotid  disease prior to his bypass.  We will go ahead and check carotid  Dopplers to make sure there has been no advancement in that disease.  He  is to continue his aspirin.  The patient continues to feel fatigued and  does have some low heart rates.  He has had some blood pressures in the  100 systolic range in the past.  I wonder if the bisoprolol is causing  any of his symptoms.  I would hate to take him off that, as he did have  a myocardial infarction prior to his bypass.  Therefore, I have asked  him to try to quarter his tablets to 1.25 mg daily and see if this  improves his symptoms.  We may have to discontinue this in the future.  He denies any true near-syncope, but says that he did feel unusual while  he was having his visual changes.  I have recommended that we go ahead  and proceed with 30-day CardioNet monitor to see if there are any  arrhythmias contributing to his symptoms.  He is also concerned that his  symptoms are related to his coronary disease.  He has had some chest  soreness and shortness of breath.  I tried to reassure him about this  today, but I think we will go ahead and proceed with a stress Myoview  study.  He does note that his visual symptoms occur after exertion.  He  says he can walk on a treadmill and this will help [place] further know what  happens when he exerts himself.  We will also get some lab work with a  CMET and a CBC.  He had a normal TSH in April 2007.  I will bring him  back to follow up on all the above with Dr. Diona Browner in the next two to  three weeks.  I  have recommended that he also set himself up with an optometrist or an ophthalmologist to do  a vision screen.       [name] Newcomer, PA-C  Electronically Signed      Cecil Cranker, MD, [facility]  Electronically Signed   SW/MedQ  DD: 04/12/2006  DT: 04/12/2006  Job #: [id number]

## 2010-09-17 NOTE — Assessment & Plan Note (Signed)
[facility]                                 ON-CALL NOTE   [name], [name]                     MRN:          [id number]  DATE:04/11/2006                            DOB:          07/17/1950    PHONE NUMBER:  [phone]   CARDIOLOGIST:  Dr. Diona Browner.   HISTORY:  [name] [name] is a 60 year old male patient with a history of non-  ST-elevation myocardial infarction in March of 2007 status post coronary  artery bypass grafting x4, who called our answering service tonight with  some complaints of vision changes.  He notes that, prior to his bypass,  he had a couple of episodes of distorted vision.  However, with his  heart attack, he did develop chest discomfort.  Over the last couple of  weeks he has had a couple of episodes of blurred vision again.  The last  of which was earlier today.  This episode lasted about an hour.  He  describes it as his vision being distorted.  He denies any associated  headache.  Denies any facial drooping, numbness, unilateral weakness.  He denies any eye discomfort or nausea.  Denies any diaphoresis.  He  denies any chest discomfort.  He does note some soreness in his sternum,  but he has had this off and on since his bypass surgery and it is a  little bit worse this week.  He did play golf yesterday.  He denies any  shortness of breath, syncope, or near-syncope.  He does wear glasses for  myopia.  He felt beginning a little weak when he was having his visual  change.  Otherwise, now, he feels fine.  He checked his blood pressure  afterwards, and it was 106/52 and his pulse was 56.   PLAN:  I explained to the patient that his symptoms are not suggestive  of angina.  He has had no discomfort in his chest, like he had with his  myocardial infarction.  I did explain to him that I thought his symptoms  were more ophthalmologic.  He does want to be seen in our office.  I  will get him set up for an appointment tomorrow.  As  long as his EKG is  unchanged and there are no focal findings on exam, he will likely need  to be referred to an optometrist or an ophthalmologist.  He knows that  if he has any recurrence of symptoms tonight, or symptoms suggestive of  his previous angina, he should go to the emergency room.   DISPOSITION:  As noted above.  The patient to be set up for an  appointment in the office tomorrow.     [name] Newcomer, PA-C  Electronically Signed      Gerrit Friends. Dietrich Pates, MD, [facility]  Electronically Signed   SW/MedQ  DD: 04/11/2006  DT: 04/11/2006  Job #: [id number]

## 2010-09-17 NOTE — Consult Note (Signed)
NAME:  [name] [name], [name] [name] NO.:  [id number]   MEDICAL RECORD NO.:  [id number]          PATIENT TYPE:  INP   LOCATION:  2303                         FACILITY:  [facility]   PHYSICIAN:  Salvatore Decent. Hendrickson, M.D.DATE OF BIRTH:  04-Feb-1951   DATE OF CONSULTATION:  07/26/2005  DATE OF DISCHARGE:                                   CONSULTATION   REASON FOR CONSULTATION:  Severe three-vessel coronary disease, status post  non-Q-wave MI.   HISTORY OF PRESENT ILLNESS:  [name] [name] is a 60 year old gentleman with no  prior history of heart disease, who presents with a chief complaint of chest  pain.  He does suffer from chronic fatigue syndrome and fibromyalgia but was  playing golf on Friday.  After playing golf he had some shortness of breath  and also had visual changes.  He said his eyes were blurry and that lasted a  couple of hours, which was not a history consistent with amaurosis fugax.  He was awakened around 3 or 4 a.m. Saturday with severe substernal crushing  chest pain with radiation in his back and shoulders and diaphoresis.  He  could not get comfortable.  He sat up in a chair for awhile and finally the  pain eased off after about an hour.  He was able to sleep a little bit more  but through the course of the day, he had waxing and waning chest pain but  not as severe as it had been previously.  He did not seek medical attention.  Then Sunday he had continued waxing and waning chest pain and worsening  feeling of overall weakness and whenever he would do any minimal exertion,  he would experience worsening chest discomfort.  He finally came to the  emergency room and had a non-Q-wave myocardial infarction with a troponin of  20.  He was admitted, treated with 600 mg of Plavix, aspirin, nitroglycerin  and heparin.  He subsequently underwent catheterization this morning, which  showed severe two-vessel disease with moderate disease in his circumflex.  He had  approximately 40% distal left main, 50% proximal circumflex, 80% LAD,  90% large diagonal with large thrombus, as well as total occlusion of his  right coronary, which filled via left-to-right collaterals.  His ejection  fraction was approximately 40%.  The patient currently is pain-free.   PAST MEDICAL HISTORY:  1.  Chronic fatigue syndrome and fibromyalgia.  2.  Previous back surgery x2 with some mild residual right leg weakness but      does not walk with a cane or walker.   MEDICATIONS PRIOR TO ADMISSION:  He uses Nasacort and Afrin nasal spray  p.r.n., testosterone cream for his chronic fatigue syndrome.   He has no known drug allergies.   FAMILY HISTORY:  No history of premature coronary disease.   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  He does not use tobacco or alcohol.  He  lives with his wife.   REVIEW OF SYSTEMS:  As noted in the HPI, he has had  chronic fatigue for  several years but this has worsened significantly over the past several  month.  No history of excessive bleeding or bruising.  No history of stroke  or TIA.  All other systems are negative.   PHYSICAL EXAMINATION:  GENERAL:  [name] [name] is an anxious 60 year old white  male in no acute distress.  He is well-developed and well-nourished.  VITAL SIGNS:  His blood pressure is 120/70, pulse is 72 and regular,  respirations are 16.  NEUROLOGIC:  He is alert and oriented x3.  He is appropriate with no obvious  focal motor deficit.  HEENT:  Unremarkable.  NECK:  Supple without thyromegaly, adenopathy or bruits.  CARDIAC:  Regular rate and rhythm, normal S1, S2, no murmurs, rubs or  gallops.  LUNGS:  Clear with equal breath sounds bilaterally.  There is no wheezing.  ABDOMEN:  Soft, nontender.  EXTREMITIES:  There is no clubbing, cyanosis, or edema.  He has a flattening  of his pulse oximetry tracing with compression of his left radial artery  but  does appear to have adequate refill but based on the pulse oximetry would be  an abnormal Allen test.  SKIN:  Warm and dry.   LABORATORY DATA:  His urinalysis was negative.  White count is 8.8,  hematocrit 37, platelets 213.  His sodium is 138, potassium 3.9, BUN and  creatinine 5 and 0.9, glucose is 87.  AST was 149.  Remainder of LFTs within  normal limits.  EKG showed normal sinus rhythm.  Peak troponin was 19.87.  Peak CK was 1005 with an MB of 82.   [name] [name] is a 60 year old gentleman with no prior cardiac history who  presents with an acute non-Q-wave myocardial infarction.  At catheterization  he has severe disease in his left anterior descending artery and a large  diagonal, which has a large thrombus within it.  He also has total occlusion  of his right coronary.  He had more moderate disease in his circumflex,  which is also a relatively small vessel which may or may not be graftable.  His ejection fraction was approximately 40%.  In any event, coronary artery  bypass grafting is indicated for survival benefit and relief of symptoms.  I  have discussed in detail with the patient and his wife the indications,  risks, benefits, and alternatives.  They understand the risks include but  are not limited to death, stroke, MI, DVT, PE, bleeding, possible need for  transfusions, infection as well as other organ system dysfunction including  respiratory, renal or GI complications.   There are two complicating issues in this case.  One is the large thrombus  in the diagonal and secondly is the Plavix.  He received 600 mg of Plavix on  Sunday and then 75 mg daily since.  Normally with the Plavix we would wait  five to seven days for the Plavix to wash out prior to surgery, but I am  concerned about delaying surgery this long given the severity of this  disease, particularly with thrombus.  He would need to stay on heparin and Integrilin, and there is certainly risk associated  with being on those  medications over that period of time.  I discussed with the patient and his  wife the three options as I see them.  One would be to proceed with surgery  in spite of the Plavix and the high bleeding risk along with the associated  morbidities, including elevated risk of massive  transfusion and/or  __________ for bleeding.  Second would be to proceed with coronary bypass  grafting using aprotinin to decrease the bleeding risk.  This would not  eliminate it but it would significantly decrease the bleeding risk.  However, there has been some literature that suggests a questionable  increase in the risk of renal or neurologic complications and possibly even  death.  Finally, delayed CABG after five to seven days.  In my view, this is  the least desirable due to the risk of recurrent infarction given that he  has already had post-infarct angina albeit was not on Integrilin at that  time.   My personal opinion would be to favor option #2, which would be to proceed  with the coronary bypass grafting and use aprotinin to decrease his bleeding  risk, as I personally feel the data against aprotinin is very marginal.  Mr.  and Mrs. [name] wish to think over their options and discuss these issues  prior to deciding how to proceed.           ______________________________  Salvatore Decent Dorris Fetch, M.D.     SCH/MEDQ  D:  07/26/2005  T:  07/28/2005  Job:  [id number]   cc:   Macarthur Critchley. Shelva Majestic, M.D.  Fax: [phone]   Learta Codding, M.D. [facility]  [address]  [address]  [place]  [place] [address]

## 2010-09-17 NOTE — Cardiovascular Report (Signed)
NAME:  [name] [name], [name] [name] NO.:  [id number]   MEDICAL RECORD NO.:  [id number]          PATIENT TYPE:  INP   LOCATION:  2399                         FACILITY:  [facility]   PHYSICIAN:  Salvadore Farber, M.D. LHCDATE OF BIRTH:  [birth year]   DATE OF PROCEDURE:  07/26/2005  DATE OF DISCHARGE:                              CARDIAC CATHETERIZATION   PROCEDURE:  Left heart catheterization, ventriculography, coronary  angiography, abdominal aortography, StarClose closure of the right common  femoral arteriotomy site.   INDICATIONS:  Mr. Pons is a 60 year old gentleman with hyperlipidemia and  chronic fatigue syndrome who presents with non-ST elevation myocardial  infarction.  Electrocardiogram demonstrated only some T wave flattening in  V6 and was otherwise unremarkable.  Troponin rose to 19.9.  He was then  referred for diagnostic angiography and possible percutaneous coronary  intervention.   PROCEDURAL TECHNIQUE:  Informed consent was obtained.  Under 1% lidocaine  local anesthesia, a 5-French sheath was placed in the right common femoral  artery using a modified Seldinger technique.  Diagnostic angiography and  ventriculography were performed using JL-4, JR-4, pigtail catheters.  The  pigtail catheter was then pulled back to the suprarenal abdominal aorta.  Abdominal aortography was performed by power injection.  The arteriotomy was  then closed using StarClose device.  Complete hemostasis was obtained.  He  was then transferred to the holding room in stable condition.   COMPLICATIONS:  None.   FINDINGS:  1.  LV:  93/11/22.  EF approximately 40% with mid anterior and mid inferior      hypokinesis.  Basal and apical function are preserved.  2.  No aortic stenosis or mitral regurgitation.  3.  Left main:  Distal 40% stenosis.  4.  LAD:  Large vessel giving rise to a single very large diagonal branch.      The proximal LAD has a long segment of diffuse disease with up to  80%      stenosis.  The distal vessel was relatively free of disease.  The large      diagonal has a large thrombus in its midsection with a 90% stenosis but      TIMI 3 flow.  5.  Circumflex:  Fairly small vessel with a 50% ostial stenosis.  It gives      rise to a single small small obtuse marginal branch.  6.  RCA:  The vessel is chronically totally occluded proximally.  It is a      large dominant vessel.  The distal vessel is collateralized from the      distal circumflex.  7.  Left internal mammary artery is large and normal.  The abdominal aorta      is nonaneurysmal has minor plaquing with no significant stenosis.  8.  Renal arteries:  Single vessels bilaterally, both of which were normal.   IMPRESSION/PLAN:  The patient has severe multivessel coronary disease with  moderately impaired left ventricular systolic function.  Will refer for  CABG.  Will discontinue his Plavix.  Will continue heparin and begin  Integrilin.      Salvadore Farber,  M.D. [facility]  Electronically Signed     WED/MEDQ  D:  07/26/2005  T:  07/27/2005  Job:  [id number]   cc:   Macarthur Critchley. Shelva Majestic, M.D.  Fax: [phone]

## 2010-09-17 NOTE — Discharge Summary (Signed)
NAME:  ALEJANDRA, BARNA NO.:  [id number]   MEDICAL RECORD NO.:  [id number]          PATIENT TYPE:  INP   LOCATION:  2035                         FACILITY:  [facility]   PHYSICIAN:  Salvatore Decent. Dorris Fetch, M.D.DATE OF BIRTH:  May 26, 1950   DATE OF ADMISSION:  07/24/2005  DATE OF DISCHARGE:                                 DISCHARGE SUMMARY   PRIMARY ADMITTING DIAGNOSIS:  Chest pain.   ADDITIONAL/DISCHARGE DIAGNOSES:  1.  Severe three-vessel coronary artery disease.  2.  Non-Q-wave myocardial infarction.  3.  Postoperative atrial fibrillation.  4.  Postoperative hypotension.  5.  Chronic fatigue syndrome.  6.  Fibromyalgia.  7.  Status post previous back surgeries x2 with mild right residual leg      weakness.   PROCEDURES PERFORMED.:  1.  Cardiac catheterization.  2.  Coronary artery bypass grafting x4 (left internal mammary artery to the      LAD, left radial artery to the first diagonal, second vein graft      sequentially to posterior descending and posterolateral artery).  3.  Endoscopic vein harvest right thigh.  4.  Open left radial artery harvest.   HISTORY:  The patient is a 60 year old male who presented on the date of  admission complaining of chest pain.  He developed some chest discomfort  with shortness of breath and visual changes following a golf game.  Later  that evening, he awakened with crushing substernal chest pain radiating down  his back and shoulders and associated with diaphoresis.  Pain waxed and  waned over the course the next day and ultimately presented to the emergency  department for further evaluation.  He was noted to have a troponin of 20  and was diagnosed with a non-Q-wave myocardial infarction.  His subsequently  admitted for further treatment.   HOSPITAL COURSE:  The patient was admitted by cardiology and started on  aspirin, Plavix and IV heparin.  He underwent cardiac catheterization on  July 25, 2005 and was found to have  severe three-vessel coronary artery  disease including a 40% distal left main, 50% proximal circumflex, 80% LAD,  90% large diagonal with large thrombus as well as total occlusion of his  right coronary which filled via left-to-right collaterals.  Ejection  fraction was approximately 40%.  Due to the diffuse nature of his disease,  he was felt to be a poor candidate for percutaneous intervention.  Therefore, a cardiothoracic surgery consultation was obtained.  The patient  was seen by Dr. Charlett Lango who also reviewed his films.  Dr.  Dorris Fetch agreed that his best course of action would be to proceed with  surgical revascularization.  Ordinarily after receiving Plavix, it would be  ideal to wait five to seven days prior to surgery to allow washout of the  Plavix. However, because of his severe disease and particularly with a large  thrombus in the diagonal, Dr. Dorris Fetch felt that this was his best  option.  He explained all the risks as well as the benefits and alternatives  of proceeding immediately with surgery with the patient and his family, and  they did agree to proceed.  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  He remained pain free and stable  and was taken to the operating room on July 27, 2005 where he underwent  CABG x4 as described in detail above.  He tolerated the procedure well and  was transferred to the SICU in stable condition.  He was anemic requiring a  transfusion of packed red blood cells immediately postoperatively.  He was  able to be extubated shortly after surgery.  He was hemodynamically stable  on postoperative day #1, although he was somewhat hypotensive and required  both dopamine and Neo-Synephrine drips initially.  His chest tubes were  removed, and he was kept in the unit for further observation.  He was slowly  mobilized.  He  did develop rapid atrial fibrillation and was started on an  amiodarone drip.  Following the initiation of the amiodarone, his blood  pressure dropped to the mid 70s systolic.  He did require continuation of  all pressors agents to maintain his blood pressure.  He also required an  additional transfusion of packed red blood cells.  His blood pressure  stabilized and leveled off in the 90s to 100 systolic.  He continued to have  excellent cardiac output.  A 2-D echocardiogram was performed, and his left  and a ventricular ejection fraction was noted to be 45%.  He had no evidence  of tamponade and no significant effusion, no mitral regurgitation or  tricuspid regurgitation.  He remained quite volume overloaded and was  started on Lasix for diuresis.  Over the course of the next 48 hours, his  Neo-Synephrine and dopamine drips were weaned and ultimately were  discontinued.  His blood pressures remained stable and in the low 100s.  He  converted to normal sinus rhythm and was able to be switched from an  amiodarone drip to a p.o. dose.  By postoperative day #4, he was able to be  transferred to the floor.  Since that time, he has ambulated in the halls,  cardiac rehab phase one and is making good progress.  He is maintaining  normal sinus rhythm on p.o. amiodarone.  However, beta blocker has not been  started at this time secondary to borderline blood pressures which remained  in the low 100s systolic.  He continues to remain about 15 pounds above his  preoperative weight and has mild lower extremity edema on physical exam.  He  is tolerating a regular diet.  His surgical incision sites are all healing  well.   His most recent labs from July 31, 2005 show sodium 131, potassium 3.9, BUN  25, creatinine 1.6. Hemoglobin 13.2, hematocrit 38.4, white count 8.7,  platelets 174.  Total cholesterol 129, triglycerides 46, HDL 52, LDL 68,  VLDL 9.  He continues to work on reconditioning as well as  pulmonary toilet measures.  It is anticipated that if he continues to progress over the next 24 to 48  hours he will hopefully be ready for discharge home at this time.  He will  have repeat labs on the morning of August 02, 2005, specifically a CBC and  basic metabolic panel.   DISCHARGE MEDICATIONS:  Discharge medications will be as follows:  1.  Enteric-coated aspirin 325 mg q.d.  2.  Amiodarone 200 mg b.i.d.  3.  Lasix 40 mg q.d. for 2 weeks.  4.  K-Dur 20 mEq q.d. for 2 weeks.  5.  Lipitor 80 mg q.h.s.  6.  Tylox 1 to 2 q.4-6h. p.r.n. for pain  7.  Nasonex as taken at home.   DISCHARGE INSTRUCTIONS:  He is asked to refrain from driving, heavy lifting  or strenuous activity.  He may continue to ambulate daily and use his  incentive spirometer.  He may shower daily and clean his incisions with soap  and water.  He will continue a low-fat, low-sodium diet.   DISCHARGE FOLLOWUP:  He is asked to see Dr. Andee Lineman in two weeks and have a  chest x-ray at that visit.  He will then follow up with Dr. Dorris Fetch on  April 16 at 2:45 p.m. He will bring his chest x-ray to this appointment and  is asked to call in the interim if he experiences any problems or has  questions.      Coral Ceo, P.A.    ______________________________  Salvatore Decent Dorris Fetch, M.D.    GC/MEDQ  D:  08/01/2005  T:  08/01/2005  Job:  [id number]   cc:   Learta Codding, M.D. [facility]  [address]  [address]  [place]  [place] [address]   Corine Shelter, M.D.  [place]

## 2010-10-25 ENCOUNTER — Encounter (HOSPITAL_COMMUNITY): Payer: BC Managed Care – PPO

## 2010-10-27 ENCOUNTER — Encounter (HOSPITAL_COMMUNITY)
Admission: RE | Admit: 2010-10-27 | Discharge: 2010-10-27 | Disposition: A | Discharge status: Home / Self Care | Payer: BC Managed Care – PPO | Source: Ambulatory Visit | Attending: Cardiology | Admitting: Cardiology

## 2010-10-27 DIAGNOSIS — I252 Old myocardial infarction: Secondary | ICD-10-CM | POA: Insufficient documentation

## 2010-10-27 DIAGNOSIS — Z5189 Encounter for other specified aftercare: Secondary | ICD-10-CM | POA: Insufficient documentation

## 2010-10-27 DIAGNOSIS — I2589 Other forms of chronic ischemic heart disease: Secondary | ICD-10-CM | POA: Insufficient documentation

## 2010-10-27 DIAGNOSIS — Z9861 Coronary angioplasty status: Secondary | ICD-10-CM | POA: Insufficient documentation

## 2010-10-27 DIAGNOSIS — Z7901 Long term (current) use of anticoagulants: Secondary | ICD-10-CM | POA: Insufficient documentation

## 2010-10-27 DIAGNOSIS — G4730 Sleep apnea, unspecified: Secondary | ICD-10-CM | POA: Insufficient documentation

## 2010-10-27 DIAGNOSIS — I2510 Atherosclerotic heart disease of native coronary artery without angina pectoris: Secondary | ICD-10-CM | POA: Insufficient documentation

## 2010-10-27 DIAGNOSIS — I2582 Chronic total occlusion of coronary artery: Secondary | ICD-10-CM | POA: Insufficient documentation

## 2010-10-27 DIAGNOSIS — Z951 Presence of aortocoronary bypass graft: Secondary | ICD-10-CM | POA: Insufficient documentation

## 2010-10-27 DIAGNOSIS — E785 Hyperlipidemia, unspecified: Secondary | ICD-10-CM | POA: Insufficient documentation

## 2010-10-29 ENCOUNTER — Encounter (HOSPITAL_COMMUNITY): Payer: BC Managed Care – PPO

## 2010-11-01 ENCOUNTER — Encounter (HOSPITAL_COMMUNITY): Payer: BC Managed Care – PPO | Attending: Cardiology

## 2010-11-01 DIAGNOSIS — Z951 Presence of aortocoronary bypass graft: Secondary | ICD-10-CM | POA: Insufficient documentation

## 2010-11-01 DIAGNOSIS — Z5189 Encounter for other specified aftercare: Secondary | ICD-10-CM | POA: Insufficient documentation

## 2010-11-01 DIAGNOSIS — Z7901 Long term (current) use of anticoagulants: Secondary | ICD-10-CM | POA: Insufficient documentation

## 2010-11-01 DIAGNOSIS — I252 Old myocardial infarction: Secondary | ICD-10-CM | POA: Insufficient documentation

## 2010-11-01 DIAGNOSIS — Z9861 Coronary angioplasty status: Secondary | ICD-10-CM | POA: Insufficient documentation

## 2010-11-01 DIAGNOSIS — I2589 Other forms of chronic ischemic heart disease: Secondary | ICD-10-CM | POA: Insufficient documentation

## 2010-11-01 DIAGNOSIS — I2582 Chronic total occlusion of coronary artery: Secondary | ICD-10-CM | POA: Insufficient documentation

## 2010-11-01 DIAGNOSIS — I2510 Atherosclerotic heart disease of native coronary artery without angina pectoris: Secondary | ICD-10-CM | POA: Insufficient documentation

## 2010-11-01 DIAGNOSIS — E785 Hyperlipidemia, unspecified: Secondary | ICD-10-CM | POA: Insufficient documentation

## 2010-11-01 DIAGNOSIS — G4730 Sleep apnea, unspecified: Secondary | ICD-10-CM | POA: Insufficient documentation

## 2010-11-03 ENCOUNTER — Encounter (HOSPITAL_COMMUNITY): Payer: BC Managed Care – PPO

## 2010-11-05 ENCOUNTER — Encounter (HOSPITAL_COMMUNITY): Payer: BC Managed Care – PPO

## 2010-11-08 ENCOUNTER — Encounter (HOSPITAL_COMMUNITY): Payer: BC Managed Care – PPO

## 2010-11-10 ENCOUNTER — Encounter (HOSPITAL_COMMUNITY): Payer: BC Managed Care – PPO

## 2010-11-12 ENCOUNTER — Encounter (HOSPITAL_COMMUNITY): Payer: BC Managed Care – PPO

## 2010-11-15 ENCOUNTER — Encounter (HOSPITAL_COMMUNITY): Payer: BC Managed Care – PPO

## 2010-11-17 ENCOUNTER — Encounter (HOSPITAL_COMMUNITY): Payer: BC Managed Care – PPO

## 2010-11-19 ENCOUNTER — Encounter (HOSPITAL_COMMUNITY): Payer: BC Managed Care – PPO

## 2010-11-22 ENCOUNTER — Encounter (HOSPITAL_COMMUNITY): Payer: BC Managed Care – PPO

## 2010-11-24 ENCOUNTER — Encounter (HOSPITAL_COMMUNITY): Payer: BC Managed Care – PPO

## 2010-11-26 ENCOUNTER — Encounter (HOSPITAL_COMMUNITY): Payer: BC Managed Care – PPO

## 2010-11-29 ENCOUNTER — Encounter (HOSPITAL_COMMUNITY): Payer: BC Managed Care – PPO

## 2010-12-01 ENCOUNTER — Encounter (HOSPITAL_COMMUNITY): Payer: BC Managed Care – PPO | Attending: Cardiology

## 2010-12-01 DIAGNOSIS — Z9861 Coronary angioplasty status: Secondary | ICD-10-CM | POA: Insufficient documentation

## 2010-12-01 DIAGNOSIS — I2589 Other forms of chronic ischemic heart disease: Secondary | ICD-10-CM | POA: Insufficient documentation

## 2010-12-01 DIAGNOSIS — G4730 Sleep apnea, unspecified: Secondary | ICD-10-CM | POA: Insufficient documentation

## 2010-12-01 DIAGNOSIS — Z951 Presence of aortocoronary bypass graft: Secondary | ICD-10-CM | POA: Insufficient documentation

## 2010-12-01 DIAGNOSIS — Z7901 Long term (current) use of anticoagulants: Secondary | ICD-10-CM | POA: Insufficient documentation

## 2010-12-01 DIAGNOSIS — E785 Hyperlipidemia, unspecified: Secondary | ICD-10-CM | POA: Insufficient documentation

## 2010-12-01 DIAGNOSIS — Z5189 Encounter for other specified aftercare: Secondary | ICD-10-CM | POA: Insufficient documentation

## 2010-12-01 DIAGNOSIS — I2582 Chronic total occlusion of coronary artery: Secondary | ICD-10-CM | POA: Insufficient documentation

## 2010-12-01 DIAGNOSIS — I2510 Atherosclerotic heart disease of native coronary artery without angina pectoris: Secondary | ICD-10-CM | POA: Insufficient documentation

## 2010-12-01 DIAGNOSIS — I252 Old myocardial infarction: Secondary | ICD-10-CM | POA: Insufficient documentation

## 2010-12-03 ENCOUNTER — Encounter (HOSPITAL_COMMUNITY): Payer: BC Managed Care – PPO

## 2010-12-06 ENCOUNTER — Encounter (HOSPITAL_COMMUNITY): Payer: BC Managed Care – PPO

## 2010-12-08 ENCOUNTER — Encounter (HOSPITAL_COMMUNITY): Payer: BC Managed Care – PPO

## 2010-12-10 ENCOUNTER — Encounter (HOSPITAL_COMMUNITY): Payer: BC Managed Care – PPO

## 2010-12-13 ENCOUNTER — Encounter (HOSPITAL_COMMUNITY): Payer: BC Managed Care – PPO

## 2010-12-15 ENCOUNTER — Encounter (HOSPITAL_COMMUNITY): Payer: BC Managed Care – PPO

## 2010-12-17 ENCOUNTER — Encounter (HOSPITAL_COMMUNITY): Payer: BC Managed Care – PPO

## 2010-12-20 ENCOUNTER — Encounter (HOSPITAL_COMMUNITY): Payer: BC Managed Care – PPO

## 2010-12-22 ENCOUNTER — Encounter (HOSPITAL_COMMUNITY): Payer: BC Managed Care – PPO

## 2010-12-24 ENCOUNTER — Encounter (HOSPITAL_COMMUNITY): Payer: BC Managed Care – PPO

## 2010-12-27 ENCOUNTER — Encounter (HOSPITAL_COMMUNITY): Payer: BC Managed Care – PPO

## 2010-12-29 ENCOUNTER — Encounter (HOSPITAL_COMMUNITY): Payer: BC Managed Care – PPO

## 2010-12-31 ENCOUNTER — Encounter (HOSPITAL_COMMUNITY): Payer: BC Managed Care – PPO

## 2011-01-03 ENCOUNTER — Encounter (HOSPITAL_COMMUNITY): Payer: BC Managed Care – PPO

## 2011-01-05 ENCOUNTER — Encounter (HOSPITAL_COMMUNITY): Payer: BC Managed Care – PPO

## 2011-01-07 ENCOUNTER — Encounter (HOSPITAL_COMMUNITY): Payer: BC Managed Care – PPO

## 2011-01-10 ENCOUNTER — Encounter (HOSPITAL_COMMUNITY): Payer: BC Managed Care – PPO

## 2011-01-12 ENCOUNTER — Encounter (HOSPITAL_COMMUNITY): Payer: BC Managed Care – PPO

## 2011-01-14 ENCOUNTER — Encounter (HOSPITAL_COMMUNITY): Payer: BC Managed Care – PPO

## 2011-01-17 ENCOUNTER — Encounter (HOSPITAL_COMMUNITY): Payer: BC Managed Care – PPO

## 2011-01-19 ENCOUNTER — Encounter (HOSPITAL_COMMUNITY): Payer: BC Managed Care – PPO

## 2011-01-21 ENCOUNTER — Encounter (HOSPITAL_COMMUNITY): Payer: BC Managed Care – PPO

## 2011-01-24 ENCOUNTER — Encounter (HOSPITAL_COMMUNITY): Payer: BC Managed Care – PPO

## 2011-01-26 ENCOUNTER — Encounter (HOSPITAL_COMMUNITY): Payer: BC Managed Care – PPO

## 2011-01-28 ENCOUNTER — Encounter (HOSPITAL_COMMUNITY): Admission: RE | Admit: 2011-01-28 | Payer: BC Managed Care – PPO | Source: Ambulatory Visit

## 2011-03-07 DIAGNOSIS — R7989 Other specified abnormal findings of blood chemistry: Secondary | ICD-10-CM | POA: Insufficient documentation

## 2011-03-07 DIAGNOSIS — G4733 Obstructive sleep apnea (adult) (pediatric): Secondary | ICD-10-CM | POA: Insufficient documentation

## 2011-03-07 DIAGNOSIS — I513 Intracardiac thrombosis, not elsewhere classified: Secondary | ICD-10-CM | POA: Insufficient documentation

## 2011-03-07 DIAGNOSIS — M797 Fibromyalgia: Secondary | ICD-10-CM | POA: Insufficient documentation

## 2011-03-07 DIAGNOSIS — Z9989 Dependence on other enabling machines and devices: Secondary | ICD-10-CM | POA: Insufficient documentation

## 2011-03-07 DIAGNOSIS — I10 Essential (primary) hypertension: Secondary | ICD-10-CM | POA: Insufficient documentation

## 2011-03-07 DIAGNOSIS — I255 Ischemic cardiomyopathy: Secondary | ICD-10-CM | POA: Insufficient documentation

## 2011-03-07 DIAGNOSIS — I2510 Atherosclerotic heart disease of native coronary artery without angina pectoris: Secondary | ICD-10-CM | POA: Insufficient documentation

## 2011-03-14 DIAGNOSIS — Z95810 Presence of automatic (implantable) cardiac defibrillator: Secondary | ICD-10-CM | POA: Insufficient documentation

## 2014-04-16 DIAGNOSIS — E875 Hyperkalemia: Secondary | ICD-10-CM | POA: Insufficient documentation

## 2014-04-16 HISTORY — DX: Hyperkalemia: E87.5

## 2016-05-04 DIAGNOSIS — Z85828 Personal history of other malignant neoplasm of skin: Secondary | ICD-10-CM | POA: Diagnosis not present

## 2016-05-04 DIAGNOSIS — C44319 Basal cell carcinoma of skin of other parts of face: Secondary | ICD-10-CM | POA: Diagnosis not present

## 2016-05-09 DIAGNOSIS — I2510 Atherosclerotic heart disease of native coronary artery without angina pectoris: Secondary | ICD-10-CM | POA: Diagnosis not present

## 2016-05-09 DIAGNOSIS — E782 Mixed hyperlipidemia: Secondary | ICD-10-CM | POA: Diagnosis not present

## 2016-05-09 DIAGNOSIS — I255 Ischemic cardiomyopathy: Secondary | ICD-10-CM | POA: Diagnosis not present

## 2016-06-07 DIAGNOSIS — Z4502 Encounter for adjustment and management of automatic implantable cardiac defibrillator: Secondary | ICD-10-CM | POA: Diagnosis not present

## 2016-09-01 DIAGNOSIS — N400 Enlarged prostate without lower urinary tract symptoms: Secondary | ICD-10-CM | POA: Diagnosis not present

## 2016-09-01 DIAGNOSIS — N529 Male erectile dysfunction, unspecified: Secondary | ICD-10-CM | POA: Diagnosis not present

## 2016-09-14 DIAGNOSIS — Z4502 Encounter for adjustment and management of automatic implantable cardiac defibrillator: Secondary | ICD-10-CM | POA: Diagnosis not present

## 2016-09-14 DIAGNOSIS — Z95810 Presence of automatic (implantable) cardiac defibrillator: Secondary | ICD-10-CM | POA: Diagnosis not present

## 2016-11-08 DIAGNOSIS — E785 Hyperlipidemia, unspecified: Secondary | ICD-10-CM | POA: Diagnosis not present

## 2016-11-08 DIAGNOSIS — I10 Essential (primary) hypertension: Secondary | ICD-10-CM | POA: Diagnosis not present

## 2016-11-08 DIAGNOSIS — I255 Ischemic cardiomyopathy: Secondary | ICD-10-CM | POA: Diagnosis not present

## 2016-11-08 DIAGNOSIS — I2510 Atherosclerotic heart disease of native coronary artery without angina pectoris: Secondary | ICD-10-CM | POA: Diagnosis not present

## 2016-12-21 DIAGNOSIS — Z4502 Encounter for adjustment and management of automatic implantable cardiac defibrillator: Secondary | ICD-10-CM | POA: Diagnosis not present

## 2017-01-31 DIAGNOSIS — Z85828 Personal history of other malignant neoplasm of skin: Secondary | ICD-10-CM | POA: Diagnosis not present

## 2017-01-31 DIAGNOSIS — D485 Neoplasm of uncertain behavior of skin: Secondary | ICD-10-CM | POA: Diagnosis not present

## 2017-01-31 DIAGNOSIS — L570 Actinic keratosis: Secondary | ICD-10-CM | POA: Diagnosis not present

## 2017-01-31 DIAGNOSIS — C44319 Basal cell carcinoma of skin of other parts of face: Secondary | ICD-10-CM | POA: Diagnosis not present

## 2017-02-17 DIAGNOSIS — N529 Male erectile dysfunction, unspecified: Secondary | ICD-10-CM | POA: Diagnosis not present

## 2017-02-17 DIAGNOSIS — N400 Enlarged prostate without lower urinary tract symptoms: Secondary | ICD-10-CM | POA: Diagnosis not present

## 2017-02-17 DIAGNOSIS — J069 Acute upper respiratory infection, unspecified: Secondary | ICD-10-CM | POA: Diagnosis not present

## 2017-03-13 DIAGNOSIS — Z85828 Personal history of other malignant neoplasm of skin: Secondary | ICD-10-CM | POA: Diagnosis not present

## 2017-03-13 DIAGNOSIS — C44319 Basal cell carcinoma of skin of other parts of face: Secondary | ICD-10-CM | POA: Diagnosis not present

## 2017-03-29 DIAGNOSIS — I472 Ventricular tachycardia: Secondary | ICD-10-CM | POA: Diagnosis not present

## 2017-03-29 DIAGNOSIS — Z4502 Encounter for adjustment and management of automatic implantable cardiac defibrillator: Secondary | ICD-10-CM | POA: Diagnosis not present

## 2017-03-29 DIAGNOSIS — Z95810 Presence of automatic (implantable) cardiac defibrillator: Secondary | ICD-10-CM | POA: Diagnosis not present

## 2017-04-12 DIAGNOSIS — L570 Actinic keratosis: Secondary | ICD-10-CM | POA: Diagnosis not present

## 2017-04-12 DIAGNOSIS — D692 Other nonthrombocytopenic purpura: Secondary | ICD-10-CM | POA: Diagnosis not present

## 2017-04-12 DIAGNOSIS — H61001 Unspecified perichondritis of right external ear: Secondary | ICD-10-CM | POA: Diagnosis not present

## 2017-04-12 DIAGNOSIS — D485 Neoplasm of uncertain behavior of skin: Secondary | ICD-10-CM | POA: Diagnosis not present

## 2017-04-12 DIAGNOSIS — Z85828 Personal history of other malignant neoplasm of skin: Secondary | ICD-10-CM | POA: Diagnosis not present

## 2017-05-08 DIAGNOSIS — I2510 Atherosclerotic heart disease of native coronary artery without angina pectoris: Secondary | ICD-10-CM | POA: Diagnosis not present

## 2017-05-08 DIAGNOSIS — I5022 Chronic systolic (congestive) heart failure: Secondary | ICD-10-CM | POA: Diagnosis not present

## 2017-05-31 DIAGNOSIS — I255 Ischemic cardiomyopathy: Secondary | ICD-10-CM | POA: Diagnosis not present

## 2017-05-31 DIAGNOSIS — I2510 Atherosclerotic heart disease of native coronary artery without angina pectoris: Secondary | ICD-10-CM | POA: Diagnosis not present

## 2017-05-31 DIAGNOSIS — I10 Essential (primary) hypertension: Secondary | ICD-10-CM | POA: Diagnosis not present

## 2017-06-08 DIAGNOSIS — Z951 Presence of aortocoronary bypass graft: Secondary | ICD-10-CM | POA: Diagnosis not present

## 2017-06-08 DIAGNOSIS — N529 Male erectile dysfunction, unspecified: Secondary | ICD-10-CM | POA: Diagnosis not present

## 2017-06-08 DIAGNOSIS — Z8639 Personal history of other endocrine, nutritional and metabolic disease: Secondary | ICD-10-CM | POA: Diagnosis not present

## 2017-06-08 DIAGNOSIS — E782 Mixed hyperlipidemia: Secondary | ICD-10-CM | POA: Diagnosis not present

## 2017-06-08 DIAGNOSIS — I2510 Atherosclerotic heart disease of native coronary artery without angina pectoris: Secondary | ICD-10-CM | POA: Diagnosis not present

## 2017-06-08 DIAGNOSIS — Z0000 Encounter for general adult medical examination without abnormal findings: Secondary | ICD-10-CM | POA: Diagnosis not present

## 2017-06-08 DIAGNOSIS — N400 Enlarged prostate without lower urinary tract symptoms: Secondary | ICD-10-CM | POA: Diagnosis not present

## 2017-06-08 DIAGNOSIS — M797 Fibromyalgia: Secondary | ICD-10-CM | POA: Diagnosis not present

## 2017-06-08 DIAGNOSIS — I5022 Chronic systolic (congestive) heart failure: Secondary | ICD-10-CM | POA: Diagnosis not present

## 2017-06-08 DIAGNOSIS — Z23 Encounter for immunization: Secondary | ICD-10-CM | POA: Diagnosis not present

## 2017-06-08 DIAGNOSIS — Z125 Encounter for screening for malignant neoplasm of prostate: Secondary | ICD-10-CM | POA: Diagnosis not present

## 2017-06-27 DIAGNOSIS — I2510 Atherosclerotic heart disease of native coronary artery without angina pectoris: Secondary | ICD-10-CM | POA: Diagnosis not present

## 2017-06-27 DIAGNOSIS — Z79899 Other long term (current) drug therapy: Secondary | ICD-10-CM | POA: Diagnosis not present

## 2017-06-27 DIAGNOSIS — Z4502 Encounter for adjustment and management of automatic implantable cardiac defibrillator: Secondary | ICD-10-CM | POA: Diagnosis not present

## 2017-06-27 DIAGNOSIS — I255 Ischemic cardiomyopathy: Secondary | ICD-10-CM | POA: Diagnosis not present

## 2017-06-27 DIAGNOSIS — Z7982 Long term (current) use of aspirin: Secondary | ICD-10-CM | POA: Diagnosis not present

## 2017-06-27 DIAGNOSIS — Z951 Presence of aortocoronary bypass graft: Secondary | ICD-10-CM | POA: Diagnosis not present

## 2017-10-24 DIAGNOSIS — Z4502 Encounter for adjustment and management of automatic implantable cardiac defibrillator: Secondary | ICD-10-CM | POA: Diagnosis not present

## 2017-11-01 ENCOUNTER — Ambulatory Visit (INDEPENDENT_AMBULATORY_CARE_PROVIDER_SITE_OTHER): Payer: PPO

## 2017-11-01 ENCOUNTER — Other Ambulatory Visit (INDEPENDENT_AMBULATORY_CARE_PROVIDER_SITE_OTHER): Payer: Self-pay

## 2017-11-01 ENCOUNTER — Ambulatory Visit (INDEPENDENT_AMBULATORY_CARE_PROVIDER_SITE_OTHER): Payer: PPO | Admitting: Orthopaedic Surgery

## 2017-11-01 ENCOUNTER — Encounter (INDEPENDENT_AMBULATORY_CARE_PROVIDER_SITE_OTHER): Payer: Self-pay | Admitting: Orthopaedic Surgery

## 2017-11-01 DIAGNOSIS — M25551 Pain in right hip: Secondary | ICD-10-CM | POA: Diagnosis not present

## 2017-11-01 DIAGNOSIS — M1612 Unilateral primary osteoarthritis, left hip: Secondary | ICD-10-CM

## 2017-11-01 DIAGNOSIS — M25552 Pain in left hip: Secondary | ICD-10-CM

## 2017-11-01 DIAGNOSIS — M1611 Unilateral primary osteoarthritis, right hip: Secondary | ICD-10-CM

## 2017-11-01 NOTE — Progress Notes (Signed)
Office Visit Note   Patient: [name] Hall           Date of Birth: 25-Jan-1951           MRN: [id number] Visit Date: 11/01/2017              Requested by: Lawerance Cruel, [place], [place] [address] PCP: Lawerance Cruel, MD   Assessment & Plan: Visit Diagnoses:  1. Pain in left hip   2. Unilateral primary osteoarthritis, left hip   3. Unilateral primary osteoarthritis, right hip   4. Pain of right hip joint     Plan: I do feel that he is a candidate for hip replacement surgery.  He is going to check with his cardiologist in the interim.  I would like to get him set up for an intra-articular injection of a steroid in his left hip because he definitely wants to try this to see if this will help with getting into the summer.  We will work on getting this set up with Dr. Ernestina Patches.  I would like to see the patient back in about 7 weeks from now in mid August to see about setting him up for surgery in the fall.  We can answer more questions at that point.  I did go over his x-rays in detail with him and showed him in hip model.  I gave him a handout on anterior hip surgery and had a thorough discussion about the risk minutes of the surgery.  We talked about his intraoperative and postoperative course as well.  We will see him back later this year to go over this more.  Again we will set up a left hip steroid injection in the interim for him as well.  Follow-Up Instructions: Return in about 7 weeks (around 12/20/2017).   Orders:  Orders Placed This Encounter  Procedures  . XR HIP UNILAT W OR W/O PELVIS 1V LEFT   No orders of the defined types were placed in this encounter.     Procedures: No procedures performed   Clinical Data: No additional findings.   Subjective: Chief Complaint  Patient presents with  . Left Hip - Pain  Patient is on seeing for the first time.  This mainly for bilateral hip pain but left is much worse than right.  He does have a  history of a implantable defibrillator and has had significant heart issues in the past.  He actually sees cardiologist at [facility] and he feels like he is been cleared for surgery but he will see him again in a week.  He is interested in trying an intra-articular left hip steroid injection.  His pain is been going on for about 2 or 3 years but over the last night months his pain is gotten significantly worse in his left hip.  He says he cannot work anymore and cannot work at the pain either.  He has pain in the groin.  He has decreased range of motion decreased strength.  He is an avid golfer and like to be able get back to good quality of life as well.  He is on a short list of medications and none of these are blood thinner.  At this point his left hip pain is daily.  It is detrimentally affect is active daily living, quality of life, his mobility.  HPI  Review of Systems He currently denies any headache, chest pain, shortness of breath, fever, chills, nausea, vomiting.  Objective: Vital Signs: There were no vitals taken for this visit.  Physical Exam He is alert and oriented x3 and in no acute distress Ortho Exam Examination of his right left hip shows significant stiffness with any attempts of internal or external rotation.  The left hip is significantly more stiff than the right hip.  His range of motion is severely limited on the left side.  He has severe pain in the groin with attempts to rotation of the left hip. Specialty Comments:  No specialty comments available.  Imaging: Xr Hip Unilat W Or W/o Pelvis 1v Left  Result Date: 11/01/2017 An AP pelvis and lateral of the left hip show severe end-stage arthritis of both his hips.  He has flattening the femoral head bilaterally.  Has large para-articular osteophytes bilaterally as well as sclerotic changes in both hips.  He has complete loss of superior lateral joint space in both hips.  The left does appear worse than the right.    PMFS  History: There are no active problems to display for this patient.  History reviewed. No pertinent past medical history.  History reviewed. No pertinent family history.  History reviewed. No pertinent surgical history. Social History   Occupational History  . Not on file  Tobacco Use  . Smoking status: Not on file  Substance and Sexual Activity  . Alcohol use: Not on file  . Drug use: Not on file  . Sexual activity: Not on file

## 2017-11-06 ENCOUNTER — Other Ambulatory Visit (HOSPITAL_COMMUNITY): Payer: Self-pay | Admitting: Internal Medicine

## 2017-11-06 DIAGNOSIS — I2510 Atherosclerotic heart disease of native coronary artery without angina pectoris: Secondary | ICD-10-CM | POA: Diagnosis not present

## 2017-11-06 DIAGNOSIS — I509 Heart failure, unspecified: Secondary | ICD-10-CM

## 2017-11-06 DIAGNOSIS — I5022 Chronic systolic (congestive) heart failure: Secondary | ICD-10-CM | POA: Diagnosis not present

## 2017-11-09 ENCOUNTER — Ambulatory Visit (HOSPITAL_COMMUNITY)
Admission: RE | Admit: 2017-11-09 | Discharge: 2017-11-09 | Disposition: A | Discharge status: Home / Self Care | Payer: PPO | Source: Ambulatory Visit | Attending: Internal Medicine | Admitting: Internal Medicine

## 2017-11-09 DIAGNOSIS — I509 Heart failure, unspecified: Secondary | ICD-10-CM

## 2017-11-09 DIAGNOSIS — Z951 Presence of aortocoronary bypass graft: Secondary | ICD-10-CM | POA: Diagnosis not present

## 2017-11-09 DIAGNOSIS — I2510 Atherosclerotic heart disease of native coronary artery without angina pectoris: Secondary | ICD-10-CM | POA: Insufficient documentation

## 2017-11-09 DIAGNOSIS — I252 Old myocardial infarction: Secondary | ICD-10-CM | POA: Diagnosis not present

## 2017-11-09 DIAGNOSIS — I5022 Chronic systolic (congestive) heart failure: Secondary | ICD-10-CM | POA: Diagnosis not present

## 2017-11-09 DIAGNOSIS — I517 Cardiomegaly: Secondary | ICD-10-CM | POA: Insufficient documentation

## 2017-11-09 DIAGNOSIS — E785 Hyperlipidemia, unspecified: Secondary | ICD-10-CM | POA: Insufficient documentation

## 2017-11-09 NOTE — Progress Notes (Signed)
  Echocardiogram 2D Echocardiogram has been performed.  Jannett Celestine 11/09/2017, 11:13 AM

## 2017-11-10 ENCOUNTER — Telehealth (INDEPENDENT_AMBULATORY_CARE_PROVIDER_SITE_OTHER): Payer: Self-pay | Admitting: Radiology

## 2017-11-10 NOTE — Telephone Encounter (Signed)
Patient called in and said that he has been given a prescription for Eliquis, and wanted to know if it is ok that he take that and still get his hip injection next week.  I have advise patient yes, he can take that medicine, and still get the hip injection next week.  Courtney aware as well.

## 2017-11-15 ENCOUNTER — Encounter (INDEPENDENT_AMBULATORY_CARE_PROVIDER_SITE_OTHER): Payer: Self-pay | Admitting: Physical Medicine and Rehabilitation

## 2017-11-15 ENCOUNTER — Ambulatory Visit (INDEPENDENT_AMBULATORY_CARE_PROVIDER_SITE_OTHER): Payer: Self-pay

## 2017-11-15 ENCOUNTER — Ambulatory Visit (INDEPENDENT_AMBULATORY_CARE_PROVIDER_SITE_OTHER): Payer: PPO | Admitting: Physical Medicine and Rehabilitation

## 2017-11-15 DIAGNOSIS — M25552 Pain in left hip: Secondary | ICD-10-CM

## 2017-11-15 MED ORDER — PERFLUTREN LIPID MICROSPHERE
1.0000 mL | INTRAVENOUS | Status: AC | PRN
  Administered 2017-11-09: 2 mL via INTRAVENOUS

## 2017-11-15 NOTE — Progress Notes (Signed)
.  Numeric Pain Rating Scale and Functional Assessment Average Pain 7   In the last MONTH (on 0-10 scale) has pain interfered with the following?  1. General activity like being  able to carry out your everyday physical activities such as walking, climbing stairs, carrying groceries, or moving a chair?  Rating(4)   -Dye Allergies.  

## 2017-11-15 NOTE — Patient Instructions (Signed)

## 2017-11-15 NOTE — Progress Notes (Signed)
   ISSAAC SHIPPER - 67 y.o. male MRN [id number]  Date of birth: [birth year]  Office Visit Note: Visit Date: 11/15/2017 PCP: Dineen Kid, MD Referred by: Lawerance Cruel, MD  Subjective: Chief Complaint  Patient presents with  . Left Hip - Pain   HPI: Mr. Nolon Bussing is a 67 year old gentleman who comes in today at the request of Dr. Jean Rosenthal for diagnostic and therapeutic left anesthetic hip arthrogram.  Patient has bilateral hip pain with bilateral arthritis but left is much worse than right.  This started about 8 months ago and any kind of movement and walking makes it worse and rest makes it better.   ROS Otherwise per HPI.  Assessment & Plan: Visit Diagnoses:  1. Pain in left hip     Plan: Findings:  Diagnostic note for therapeutic anesthetic hip arthrogram on the left.  Patient did have relief during the anesthetic phase.    Meds & Orders: No orders of the defined types were placed in this encounter.   Orders Placed This Encounter  Procedures  . Large Joint Inj: L hip joint  . XR C-ARM NO REPORT    Follow-up: Return if symptoms worsen or fail to improve, for Dr. Ninfa Linden.   Procedures: Large Joint Inj: L hip joint on 11/15/2017 2:08 PM Indications: pain and diagnostic evaluation Details: 22 G needle, anterior approach  Arthrogram: Yes  Medications: 80 mg triamcinolone acetonide 40 MG/ML; 3 mL bupivacaine 0.5 % Outcome: tolerated well, no immediate complications  Arthrogram demonstrated excellent flow of contrast throughout the joint surface without extravasation or obvious defect.  The patient had relief of symptoms during the anesthetic phase of the injection.  Procedure, treatment alternatives, risks and benefits explained, specific risks discussed. Consent was given by the patient. Immediately prior to procedure a time out was called to verify the correct patient, procedure, equipment, support staff and site/side marked as required. Patient was prepped  and draped in the usual sterile fashion.      No notes on file   Clinical History: No specialty comments available.   He has no tobacco history on file. No results for input(s): HGBA1C, LABURIC in the last 8760 hours.  Objective:  VS:  HT:    WT:   BMI:     BP:   HR: bpm  TEMP: ( )  RESP:  Physical Exam  Ortho Exam Imaging: No results found.  Past Medical/Family/Surgical/Social History: Medications & Allergies reviewed per EMR, new medications updated. There are no active problems to display for this patient.  History reviewed. No pertinent past medical history. History reviewed. No pertinent family history. History reviewed. No pertinent surgical history. Social History   Occupational History  . Not on file  Tobacco Use  . Smoking status: Not on file  Substance and Sexual Activity  . Alcohol use: Not on file  . Drug use: Not on file  . Sexual activity: Not on file

## 2017-11-27 MED ORDER — BUPIVACAINE HCL 0.5 % IJ SOLN
3.0000 mL | INTRAMUSCULAR | Status: AC | PRN
  Administered 2017-11-15: 3 mL via INTRA_ARTICULAR

## 2017-11-27 MED ORDER — TRIAMCINOLONE ACETONIDE 40 MG/ML IJ SUSP
80.0000 mg | INTRAMUSCULAR | Status: AC | PRN
  Administered 2017-11-15: 80 mg via INTRA_ARTICULAR

## 2017-12-18 ENCOUNTER — Encounter (INDEPENDENT_AMBULATORY_CARE_PROVIDER_SITE_OTHER): Payer: Self-pay | Admitting: Orthopaedic Surgery

## 2017-12-18 ENCOUNTER — Ambulatory Visit (INDEPENDENT_AMBULATORY_CARE_PROVIDER_SITE_OTHER): Payer: PPO | Admitting: Orthopaedic Surgery

## 2017-12-18 DIAGNOSIS — M1611 Unilateral primary osteoarthritis, right hip: Secondary | ICD-10-CM | POA: Diagnosis not present

## 2017-12-18 DIAGNOSIS — M1612 Unilateral primary osteoarthritis, left hip: Secondary | ICD-10-CM | POA: Diagnosis not present

## 2017-12-18 HISTORY — DX: Unilateral primary osteoarthritis, right hip: M16.11

## 2017-12-18 HISTORY — DX: Unilateral primary osteoarthritis, left hip: M16.12

## 2017-12-18 NOTE — Progress Notes (Signed)
The patient has known severe debilitating end-stage arthritis of both his hips but the right one is been less symptomatic than his more painful left hip.  He did have an intra-articular steroid injection and that did not decrease his pain but he still concerned about the stiffness in his hip.  At this point it is detrimentally affect his mobility and his quality of life.  We have shown him his x-rays before.  He would like to consider a left total hip arthroplasty in the near future.  He is on Eliquis. He knows he needs to be off of this medication for 4 days prior to any surgical intervention.Marland Kitchen  He is going to talk to his cardiologist at [facility] about this.  He is unsure whether or not he would need to be bridged with Lovenox thus, a cardiology clearance is needed.  On exam his left hip shows almost essentially no internal or external rotation.  He does have a handout on hip replacement surgery.  We went over the surgery in detail and talked about the risk and benefits of surgery as well as a thorough discussion about his intraoperative and postoperative course.  At this point his hip is so stiff I do feel that hip replacement surgery would help decrease his pain, improve his mobility and improve his quality of life.  He agrees with this as well.  We will schedule this for sometime later in September or early October pending his cardiac clearance.  All question concerns were answered and addressed.

## 2018-01-08 ENCOUNTER — Telehealth (INDEPENDENT_AMBULATORY_CARE_PROVIDER_SITE_OTHER): Payer: Self-pay | Admitting: Orthopaedic Surgery

## 2018-01-08 NOTE — Telephone Encounter (Signed)
Please advise 

## 2018-01-08 NOTE — Telephone Encounter (Signed)
That will be fine.  He just needs to come pick up the paperwork.

## 2018-01-08 NOTE — Telephone Encounter (Signed)
Will be ready for pick up tomorrow. Patient is aware.

## 2018-01-08 NOTE — Telephone Encounter (Signed)
Patient requesting a temporary handicap placard.  # [phone]

## 2018-01-29 ENCOUNTER — Encounter (INDEPENDENT_AMBULATORY_CARE_PROVIDER_SITE_OTHER): Payer: Self-pay | Admitting: Orthopaedic Surgery

## 2018-01-30 DIAGNOSIS — Z8679 Personal history of other diseases of the circulatory system: Secondary | ICD-10-CM | POA: Diagnosis not present

## 2018-01-30 DIAGNOSIS — I472 Ventricular tachycardia: Secondary | ICD-10-CM | POA: Diagnosis not present

## 2018-01-30 DIAGNOSIS — Z4502 Encounter for adjustment and management of automatic implantable cardiac defibrillator: Secondary | ICD-10-CM | POA: Diagnosis not present

## 2018-02-05 NOTE — Patient Instructions (Addendum)
[name] Hall  02/05/2018   Your procedure is scheduled on: 02-15-18   Report to [facility] Main  Entrance    Report to admitting at 7:30AM    Call this number if you have problems the morning of surgery [phone]     Remember: Do not eat food or drink liquids :After Midnight. BRUSH YOUR TEETH MORNING OF SURGERY AND RINSE YOUR MOUTH OUT, NO CHEWING GUM CANDY OR MINTS.     Take these medicines the morning of surgery with A SIP OF WATER: metoprolol  DO NOT TAKE ANY DIABETIC MEDICATIONS DAY OF YOUR SURGERY                               You may not have any metal on your body including hair pins and              piercings  Do not wear jewelry, make-up, lotions, powders or perfumes, deodorant                    Men may shave face and neck.   Do not bring valuables to the hospital. [place].  Contacts, dentures or bridgework may not be worn into surgery.  Leave suitcase in the car. After surgery it may be brought to your room.                Please read over the following fact sheets you were given: _____________________________________________________________________             [facility] - Preparing for Surgery Before surgery, you can play an important role.  Because skin is not sterile, your skin needs to be as free of germs as possible.  You can reduce the number of germs on your skin by washing with CHG (chlorahexidine gluconate) soap before surgery.  CHG is an antiseptic cleaner which kills germs and bonds with the skin to continue killing germs even after washing. Please DO NOT use if you have an allergy to CHG or antibacterial soaps.  If your skin becomes reddened/irritated stop using the CHG and inform your nurse when you arrive at Short Stay. Do not shave (including legs and underarms) for at least 48 hours prior to the first CHG shower.  You may shave your face/neck. Please follow these  instructions carefully:  1.  Shower with CHG Soap the night before surgery and the  morning of Surgery.  2.  If you choose to wash your hair, wash your hair first as usual with your  normal  shampoo.  3.  After you shampoo, rinse your hair and body thoroughly to remove the  shampoo.                           4.  Use CHG as you would any other liquid soap.  You can apply chg directly  to the skin and wash                       Gently with a scrungie or clean washcloth.  5.  Apply the CHG Soap to your body ONLY FROM THE NECK DOWN.   Do not use on face/ open  Wound or open sores. Avoid contact with eyes, ears mouth and genitals (private parts).                       Wash face,  Genitals (private parts) with your normal soap.             6.  Wash thoroughly, paying special attention to the area where your surgery  will be performed.  7.  Thoroughly rinse your body with warm water from the neck down.  8.  DO NOT shower/wash with your normal soap after using and rinsing off  the CHG Soap.                9.  Pat yourself dry with a clean towel.            10.  Wear clean pajamas.            11.  Place clean sheets on your bed the night of your first shower and do not  sleep with pets. Day of Surgery : Do not apply any lotions/deodorants the morning of surgery.  Please wear clean clothes to the hospital/surgery center.  FAILURE TO FOLLOW THESE INSTRUCTIONS MAY RESULT IN THE CANCELLATION OF YOUR SURGERY PATIENT SIGNATURE_________________________________  NURSE SIGNATURE__________________________________  ________________________________________________________________________     Adam Phenix  An incentive spirometer is a tool that can help keep your lungs clear and active. This tool measures how well you are filling your lungs with each breath. Taking long deep breaths may help reverse or decrease the chance of developing breathing (pulmonary) problems  (especially infection) following:  A long period of time when you are unable to move or be active. BEFORE THE PROCEDURE   If the spirometer includes an indicator to show your best effort, your nurse or respiratory therapist will set it to a desired goal.  If possible, sit up straight or lean slightly forward. Try not to slouch.  Hold the incentive spirometer in an upright position. INSTRUCTIONS FOR USE  1. Sit on the edge of your bed if possible, or sit up as far as you can in bed or on a chair. 2. Hold the incentive spirometer in an upright position. 3. Breathe out normally. 4. Place the mouthpiece in your mouth and seal your lips tightly around it. 5. Breathe in slowly and as deeply as possible, raising the piston or the ball toward the top of the column. 6. Hold your breath for 3-5 seconds or for as long as possible. Allow the piston or ball to fall to the bottom of the column. 7. Remove the mouthpiece from your mouth and breathe out normally. 8. Rest for a few seconds and repeat Steps 1 through 7 at least 10 times every 1-2 hours when you are awake. Take your time and take a few normal breaths between deep breaths. 9. The spirometer may include an indicator to show your best effort. Use the indicator as a goal to work toward during each repetition. 10. After each set of 10 deep breaths, practice coughing to be sure your lungs are clear. If you have an incision (the cut made at the time of surgery), support your incision when coughing by placing a pillow or rolled up towels firmly against it. Once you are able to get out of bed, walk around indoors and cough well. You may stop using the incentive spirometer when instructed by your caregiver.  RISKS AND COMPLICATIONS  Take your time so you do  not get dizzy or light-headed.  If you are in pain, you may need to take or ask for pain medication before doing incentive spirometry. It is harder to take a deep breath if you are having  pain. AFTER USE  Rest and breathe slowly and easily.  It can be helpful to keep track of a log of your progress. Your caregiver can provide you with a simple table to help with this. If you are using the spirometer at home, follow these instructions: [place] IF:   You are having difficultly using the spirometer.  You have trouble using the spirometer as often as instructed.  Your pain medication is not giving enough relief while using the spirometer.  You develop fever of 100.5 F (38.1 C) or higher. SEEK IMMEDIATE MEDICAL CARE IF:   You cough up bloody sputum that had not been present before.  You develop fever of 102 F (38.9 C) or greater.  You develop worsening pain at or near the incision site. MAKE SURE YOU:   Understand these instructions.  Will watch your condition.  Will get help right away if you are not doing well or get worse. Document Released: 08/29/2006 Document Revised: 07/11/2011 Document Reviewed: 10/30/2006 [facility] Patient Information 2014 [place], [place].   ________________________________________________________________________

## 2018-02-05 NOTE — Progress Notes (Signed)
LOV cardio Dr Amaryllis Dyke. Stiber 11-06-17 epic care everywhere   ECHO 11-09-16 epic

## 2018-02-06 ENCOUNTER — Other Ambulatory Visit (INDEPENDENT_AMBULATORY_CARE_PROVIDER_SITE_OTHER): Payer: Self-pay | Admitting: Physician Assistant

## 2018-02-07 ENCOUNTER — Other Ambulatory Visit (INDEPENDENT_AMBULATORY_CARE_PROVIDER_SITE_OTHER): Payer: Self-pay

## 2018-02-08 ENCOUNTER — Encounter (HOSPITAL_COMMUNITY)
Admission: RE | Admit: 2018-02-08 | Discharge: 2018-02-08 | Disposition: A | Discharge status: Home / Self Care | Payer: PPO | Source: Ambulatory Visit | Attending: Orthopaedic Surgery | Admitting: Orthopaedic Surgery

## 2018-02-08 ENCOUNTER — Encounter (HOSPITAL_COMMUNITY): Payer: Self-pay

## 2018-02-08 ENCOUNTER — Other Ambulatory Visit: Payer: Self-pay

## 2018-02-08 DIAGNOSIS — Z01818 Encounter for other preprocedural examination: Secondary | ICD-10-CM | POA: Diagnosis not present

## 2018-02-08 DIAGNOSIS — I10 Essential (primary) hypertension: Secondary | ICD-10-CM | POA: Diagnosis not present

## 2018-02-08 DIAGNOSIS — R001 Bradycardia, unspecified: Secondary | ICD-10-CM | POA: Diagnosis not present

## 2018-02-08 DIAGNOSIS — M1612 Unilateral primary osteoarthritis, left hip: Secondary | ICD-10-CM | POA: Insufficient documentation

## 2018-02-08 HISTORY — DX: Intracardiac thrombosis, not elsewhere classified: I51.3

## 2018-02-08 HISTORY — DX: Ischemic cardiomyopathy: I25.5

## 2018-02-08 HISTORY — DX: Acute coronary thrombosis not resulting in myocardial infarction: I24.0

## 2018-02-08 HISTORY — DX: Adverse effect of unspecified anesthetic, initial encounter: T41.45XA

## 2018-02-08 HISTORY — DX: Heart failure, unspecified: I50.9

## 2018-02-08 HISTORY — DX: Essential (primary) hypertension: I10

## 2018-02-08 HISTORY — DX: Sleep apnea, unspecified: G47.30

## 2018-02-08 HISTORY — DX: Presence of aortocoronary bypass graft: Z95.1

## 2018-02-08 HISTORY — DX: Fibromyalgia: M79.7

## 2018-02-08 HISTORY — DX: Hyperlipidemia, unspecified: E78.5

## 2018-02-08 HISTORY — DX: Other complications of anesthesia, initial encounter: T88.59XA

## 2018-02-08 HISTORY — DX: Acute myocardial infarction, unspecified: I21.9

## 2018-02-08 HISTORY — DX: Atherosclerotic heart disease of native coronary artery without angina pectoris: I25.10

## 2018-02-08 LAB — BASIC METABOLIC PANEL
Anion gap: 9 (ref 5–15)
BUN: 9 mg/dL (ref 8–23)
CALCIUM: 9.4 mg/dL (ref 8.9–10.3)
CO2: 29 mmol/L (ref 22–32)
CREATININE: 0.84 mg/dL (ref 0.61–1.24)
Chloride: 104 mmol/L (ref 98–111)
GFR calc non Af Amer: >60 mL/min (ref >60)
GLUCOSE: 98 mg/dL (ref 70–99)
Potassium: 5.4 mmol/L — ABNORMAL HIGH (ref 3.5–5.1)
SODIUM: 142 mmol/L (ref 135–145)

## 2018-02-08 LAB — CBC
HEMATOCRIT: 46.7 % (ref 39.0–52.0)
Hemoglobin: 15 g/dL (ref 13.0–17.0)
MCH: 30 pg (ref 26.0–34.0)
MCHC: 32.1 g/dL (ref 30.0–36.0)
MCV: 93.4 fL (ref 80.0–100.0)
PLATELETS: 155 10*3/uL (ref 150–400)
RBC: 5 MIL/uL (ref 4.22–5.81)
RDW: 14 % (ref 11.5–15.5)
WBC: 5 10*3/uL (ref 4.0–10.5)
nRBC: 0 % (ref 0.0–0.2)

## 2018-02-08 LAB — SURGICAL PCR SCREEN
MRSA, PCR: NEGATIVE
Staphylococcus aureus: NEGATIVE

## 2018-02-08 NOTE — Progress Notes (Signed)
ICD ORDERS FAXED TO DR [facility]

## 2018-02-08 NOTE — Progress Notes (Signed)
TELE PHONE COMMUNICATION NOTES, DR Roderic Palau STIBER 12-26-17 ON CHART WITH ELIQUIS INSTRUCTIONS

## 2018-02-12 NOTE — Progress Notes (Signed)
[facility] and spoke with Estill Bamberg to find out status of device orders faxed on 02-08-18. Estill Bamberg reports they have not received anything and she is not familiar with device orders for surgery . RN explained purpose of device orders as instructions on how to manage the ICD . Estill Bamberg reports that Dr Linde Gillis is making rounds currently but will send him an e-mail about our request. RN offered to resend device orders , Estill Bamberg agreed to send the orders to Dr Linde Gillis once she receives them. RN will continue to f/u

## 2018-02-12 NOTE — Progress Notes (Signed)
RN received call back form [facility] . Spoke with Dr Linde Gillis; Linde Gillis asking what purpose  of order device form . After explaining, Dr Linde Gillis replied that the form would need to go to their ElectroPhysiology department which is the department that manages the ICD. Dr. Linde Gillis offered to coordinate with Estill Bamberg to get the Device order sheet to EP . RN made Dr Linde Gillis aware that patient surgery is on 02-15-18 . Dr Linde Gillis verbalized understanding. RN will continue to f/u

## 2018-02-13 NOTE — Progress Notes (Signed)
CHART LEFT WITH SHARON SHOFFNER, RN TO F/U

## 2018-02-14 NOTE — Progress Notes (Signed)
Left message with [facility] to return call. icd orders faxed to [facility] fax [phone] fax confirmation received.

## 2018-02-14 NOTE — Anesthesia Preprocedure Evaluation (Addendum)
Anesthesia Evaluation  Patient identified by MRN, date of birth, ID band Patient awake    Reviewed: Allergy & Precautions, Patient's Chart, lab work & pertinent test results  Airway Mallampati: II  TM Distance: >3 FB Neck ROM: Full    Dental no notable dental hx. (+) Teeth Intact, Dental Advisory Given   Pulmonary sleep apnea ,    Pulmonary exam normal breath sounds clear to auscultation       Cardiovascular hypertension, Pt. on medications and Pt. on home beta blockers + CAD and +CHF  Normal cardiovascular exam+ Cardiac Defibrillator  Rhythm:Regular Rate:Normal  Echo 11/09/2017  - Left ventricle: The cavity size was moderately dilated. Wall   thickness was increased in a pattern of mild LVH. Systolic   function was severely reduced. The estimated ejection fraction   was in the range of 25% to 30%. Diffuse hypokinesis. There is   akinesis of the inferolateral and apical myocardium. Doppler   parameters are consistent with abnormal left ventricular   relaxation (grade 1 diastolic dysfunction).  Impressions:  - Akinesis of the inferolateral and apical walls; overall severe LV   dysfunction; layered apical thrombus noted using definity; mild   diastolic dysfunction; moderate LVE; mild LVH; results discussed   with Derrill Kay MD.   Neuro/Psych negative neurological ROS  negative psych ROS   GI/Hepatic negative GI ROS, Neg liver ROS,   Endo/Other    Renal/GU      Musculoskeletal  (+) Arthritis , Fibromyalgia -  Abdominal   Peds  Hematology   Anesthesia Other Findings ICD magnet placement  Reproductive/Obstetrics                           Lab Results  Component Value Date   CREATININE 0.84 02/08/2018   BUN 9 02/08/2018   NA 142 02/08/2018   K 5.4 (H) 02/08/2018   CL 104 02/08/2018   CO2 29 02/08/2018    Lab Results  Component Value Date   WBC 5.0 02/08/2018   HGB 15.0  02/08/2018   HCT 46.7 02/08/2018   MCV 93.4 02/08/2018   PLT 155 02/08/2018     Anesthesia Physical Anesthesia Plan  ASA: IV  Anesthesia Plan: Spinal   Post-op Pain Management:    Induction:   PONV Risk Score and Plan: Treatment may vary due to age or medical condition  Airway Management Planned: Natural Airway and Nasal Cannula  Additional Equipment:   Intra-op Plan:   Post-operative Plan:   Informed Consent: I have reviewed the patients History and Physical, chart, labs and discussed the procedure including the risks, benefits and alternatives for the proposed anesthesia with the patient or authorized representative who has indicated his/her understanding and acceptance.   Dental advisory given  Plan Discussed with: CRNA  Anesthesia Plan Comments: (Check eliquis dc)        Anesthesia Quick Evaluation

## 2018-02-15 ENCOUNTER — Inpatient Hospital Stay (HOSPITAL_COMMUNITY)
Admission: RE | Admit: 2018-02-15 | Discharge: 2018-02-17 | DRG: 470 | Disposition: A | Discharge status: Home Health | Payer: PPO | Source: Ambulatory Visit | Attending: Orthopaedic Surgery | Admitting: Orthopaedic Surgery

## 2018-02-15 ENCOUNTER — Inpatient Hospital Stay (HOSPITAL_COMMUNITY): Payer: PPO

## 2018-02-15 ENCOUNTER — Encounter (HOSPITAL_COMMUNITY)
Admission: RE | Disposition: A | Discharge status: Home Health | Payer: Self-pay | Source: Ambulatory Visit | Attending: Orthopaedic Surgery

## 2018-02-15 ENCOUNTER — Inpatient Hospital Stay (HOSPITAL_COMMUNITY): Payer: PPO | Admitting: Anesthesiology

## 2018-02-15 ENCOUNTER — Encounter (HOSPITAL_COMMUNITY): Payer: Self-pay | Admitting: *Deleted

## 2018-02-15 ENCOUNTER — Other Ambulatory Visit: Payer: Self-pay

## 2018-02-15 DIAGNOSIS — Z23 Encounter for immunization: Secondary | ICD-10-CM | POA: Diagnosis not present

## 2018-02-15 DIAGNOSIS — Z7982 Long term (current) use of aspirin: Secondary | ICD-10-CM | POA: Diagnosis not present

## 2018-02-15 DIAGNOSIS — M797 Fibromyalgia: Secondary | ICD-10-CM | POA: Diagnosis not present

## 2018-02-15 DIAGNOSIS — E785 Hyperlipidemia, unspecified: Secondary | ICD-10-CM | POA: Diagnosis not present

## 2018-02-15 DIAGNOSIS — Z96642 Presence of left artificial hip joint: Secondary | ICD-10-CM

## 2018-02-15 DIAGNOSIS — G4730 Sleep apnea, unspecified: Secondary | ICD-10-CM | POA: Diagnosis not present

## 2018-02-15 DIAGNOSIS — M1612 Unilateral primary osteoarthritis, left hip: Secondary | ICD-10-CM | POA: Diagnosis not present

## 2018-02-15 DIAGNOSIS — Z7901 Long term (current) use of anticoagulants: Secondary | ICD-10-CM

## 2018-02-15 DIAGNOSIS — I252 Old myocardial infarction: Secondary | ICD-10-CM | POA: Diagnosis not present

## 2018-02-15 DIAGNOSIS — I110 Hypertensive heart disease with heart failure: Secondary | ICD-10-CM | POA: Diagnosis not present

## 2018-02-15 DIAGNOSIS — Z419 Encounter for procedure for purposes other than remedying health state, unspecified: Secondary | ICD-10-CM

## 2018-02-15 DIAGNOSIS — I2510 Atherosclerotic heart disease of native coronary artery without angina pectoris: Secondary | ICD-10-CM | POA: Diagnosis not present

## 2018-02-15 DIAGNOSIS — Z79899 Other long term (current) drug therapy: Secondary | ICD-10-CM

## 2018-02-15 DIAGNOSIS — I10 Essential (primary) hypertension: Secondary | ICD-10-CM | POA: Diagnosis not present

## 2018-02-15 DIAGNOSIS — Z951 Presence of aortocoronary bypass graft: Secondary | ICD-10-CM

## 2018-02-15 DIAGNOSIS — Z981 Arthrodesis status: Secondary | ICD-10-CM

## 2018-02-15 DIAGNOSIS — I509 Heart failure, unspecified: Secondary | ICD-10-CM | POA: Diagnosis not present

## 2018-02-15 DIAGNOSIS — Z471 Aftercare following joint replacement surgery: Secondary | ICD-10-CM | POA: Diagnosis not present

## 2018-02-15 DIAGNOSIS — I255 Ischemic cardiomyopathy: Secondary | ICD-10-CM | POA: Diagnosis not present

## 2018-02-15 HISTORY — DX: Presence of left artificial hip joint: Z96.642

## 2018-02-15 HISTORY — PX: TOTAL HIP ARTHROPLASTY: SHX124

## 2018-02-15 SURGERY — ARTHROPLASTY, HIP, TOTAL, ANTERIOR APPROACH
Anesthesia: Spinal | Site: Hip | Laterality: Left

## 2018-02-15 MED ORDER — PANTOPRAZOLE SODIUM 40 MG PO TBEC
40.0000 mg | DELAYED_RELEASE_TABLET | Freq: Every day | ORAL | Status: DC
  Administered 2018-02-16 – 2018-02-17 (×2): 40 mg via ORAL
  Filled 2018-02-15 (×3): qty 1

## 2018-02-15 MED ORDER — TRANEXAMIC ACID-NACL 1000-0.7 MG/100ML-% IV SOLN
INTRAVENOUS | Status: AC
  Filled 2018-02-15: qty 100

## 2018-02-15 MED ORDER — PROMETHAZINE HCL 25 MG/ML IJ SOLN: 6.2500 mg | INTRAMUSCULAR | Status: DC | PRN

## 2018-02-15 MED ORDER — EPHEDRINE 5 MG/ML INJ
INTRAVENOUS | Status: AC
  Filled 2018-02-15: qty 10

## 2018-02-15 MED ORDER — FENTANYL CITRATE (PF) 100 MCG/2ML IJ SOLN
INTRAMUSCULAR | Status: DC | PRN
  Administered 2018-02-15 (×2): 50 ug via INTRAVENOUS

## 2018-02-15 MED ORDER — MULTI-VITAMINS PO TABS: 1.0000 | ORAL_TABLET | Freq: Every day | ORAL | Status: DC

## 2018-02-15 MED ORDER — TRANEXAMIC ACID 1000 MG/10ML IV SOLN
INTRAVENOUS | Status: DC | PRN
  Administered 2018-02-15: 1000 mg via INTRAVENOUS

## 2018-02-15 MED ORDER — PNEUMOCOCCAL VAC POLYVALENT 25 MCG/0.5ML IJ INJ
0.5000 mL | INJECTION | INTRAMUSCULAR | Status: AC
  Administered 2018-02-16: 0.5 mL via INTRAMUSCULAR
  Filled 2018-02-15: qty 0.5

## 2018-02-15 MED ORDER — CHLORHEXIDINE GLUCONATE 4 % EX LIQD: 60.0000 mL | Freq: Once | CUTANEOUS | Status: DC

## 2018-02-15 MED ORDER — PHENOL 1.4 % MT LIQD
1.0000 | OROMUCOSAL | Status: DC | PRN
  Filled 2018-02-15: qty 177

## 2018-02-15 MED ORDER — ACETAMINOPHEN 325 MG PO TABS: 325.0000 mg | ORAL_TABLET | Freq: Four times a day (QID) | ORAL | Status: DC | PRN

## 2018-02-15 MED ORDER — OXYCODONE HCL 5 MG PO TABS
5.0000 mg | ORAL_TABLET | ORAL | Status: DC | PRN
  Administered 2018-02-15: 5 mg via ORAL
  Administered 2018-02-16 – 2018-02-17 (×4): 10 mg via ORAL
  Filled 2018-02-15: qty 1
  Filled 2018-02-15 (×7): qty 2

## 2018-02-15 MED ORDER — PHENYLEPHRINE HCL 10 MG/ML IJ SOLN
INTRAMUSCULAR | Status: AC
  Filled 2018-02-15: qty 2

## 2018-02-15 MED ORDER — CEFAZOLIN SODIUM-DEXTROSE 2-4 GM/100ML-% IV SOLN
2.0000 g | INTRAVENOUS | Status: AC
  Administered 2018-02-15: 2 g via INTRAVENOUS
  Filled 2018-02-15: qty 100

## 2018-02-15 MED ORDER — DEXAMETHASONE SODIUM PHOSPHATE 10 MG/ML IJ SOLN
INTRAMUSCULAR | Status: DC | PRN
  Administered 2018-02-15: 10 mg via INTRAVENOUS

## 2018-02-15 MED ORDER — PROPOFOL 10 MG/ML IV BOLUS
INTRAVENOUS | Status: AC
  Filled 2018-02-15: qty 40

## 2018-02-15 MED ORDER — FENTANYL CITRATE (PF) 100 MCG/2ML IJ SOLN
INTRAMUSCULAR | Status: AC
  Filled 2018-02-15: qty 2

## 2018-02-15 MED ORDER — SODIUM CHLORIDE 0.9 % IV SOLN
INTRAVENOUS | Status: DC | PRN
  Administered 2018-02-15: 30 ug/min via INTRAVENOUS

## 2018-02-15 MED ORDER — OXYCODONE HCL 5 MG PO TABS
10.0000 mg | ORAL_TABLET | ORAL | Status: DC | PRN
  Administered 2018-02-16 – 2018-02-17 (×3): 10 mg via ORAL

## 2018-02-15 MED ORDER — METHOCARBAMOL 500 MG IVPB - SIMPLE MED
500.0000 mg | Freq: Four times a day (QID) | INTRAVENOUS | Status: DC | PRN
  Filled 2018-02-15: qty 50

## 2018-02-15 MED ORDER — ONDANSETRON HCL 4 MG/2ML IJ SOLN: 4.0000 mg | Freq: Four times a day (QID) | INTRAMUSCULAR | Status: DC | PRN

## 2018-02-15 MED ORDER — TADALAFIL 5 MG PO TABS: 5.0000 mg | ORAL_TABLET | Freq: Every day | ORAL | Status: DC

## 2018-02-15 MED ORDER — SODIUM CHLORIDE 0.9 % IV SOLN
INTRAVENOUS | Status: DC
  Administered 2018-02-15 – 2018-02-16 (×2): via INTRAVENOUS

## 2018-02-15 MED ORDER — ASPIRIN EC 81 MG PO TBEC
81.0000 mg | DELAYED_RELEASE_TABLET | Freq: Every day | ORAL | Status: DC
  Administered 2018-02-16 – 2018-02-17 (×2): 81 mg via ORAL
  Filled 2018-02-15 (×2): qty 1

## 2018-02-15 MED ORDER — PROPOFOL 500 MG/50ML IV EMUL
INTRAVENOUS | Status: DC | PRN
  Administered 2018-02-15: 75 ug/kg/min via INTRAVENOUS

## 2018-02-15 MED ORDER — METOPROLOL SUCCINATE ER 50 MG PO TB24
50.0000 mg | ORAL_TABLET | Freq: Every day | ORAL | Status: DC
  Administered 2018-02-16: 50 mg via ORAL
  Filled 2018-02-15 (×2): qty 1

## 2018-02-15 MED ORDER — ALUM & MAG HYDROXIDE-SIMETH 200-200-20 MG/5ML PO SUSP: 30.0000 mL | ORAL | Status: DC | PRN

## 2018-02-15 MED ORDER — HYDROMORPHONE HCL 1 MG/ML IJ SOLN: 0.2500 mg | INTRAMUSCULAR | Status: DC | PRN

## 2018-02-15 MED ORDER — SODIUM CHLORIDE 0.9 % IR SOLN
Status: DC | PRN
  Administered 2018-02-15: 1000 mL

## 2018-02-15 MED ORDER — LIDOCAINE 2% (20 MG/ML) 5 ML SYRINGE
INTRAMUSCULAR | Status: AC
  Filled 2018-02-15: qty 5

## 2018-02-15 MED ORDER — MIDAZOLAM HCL 2 MG/2ML IJ SOLN
INTRAMUSCULAR | Status: AC
  Filled 2018-02-15: qty 2

## 2018-02-15 MED ORDER — ONDANSETRON HCL 4 MG/2ML IJ SOLN
INTRAMUSCULAR | Status: DC | PRN
  Administered 2018-02-15: 4 mg via INTRAVENOUS

## 2018-02-15 MED ORDER — METHOCARBAMOL 500 MG PO TABS
500.0000 mg | ORAL_TABLET | Freq: Four times a day (QID) | ORAL | Status: DC | PRN
  Administered 2018-02-17: 500 mg via ORAL
  Filled 2018-02-15 (×3): qty 1

## 2018-02-15 MED ORDER — POLYETHYLENE GLYCOL 3350 17 G PO PACK: 17.0000 g | PACK | Freq: Every day | ORAL | Status: DC | PRN

## 2018-02-15 MED ORDER — ADULT MULTIVITAMIN W/MINERALS CH
1.0000 | ORAL_TABLET | Freq: Every day | ORAL | Status: DC
  Administered 2018-02-16 – 2018-02-17 (×2): 1 via ORAL
  Filled 2018-02-15 (×2): qty 1

## 2018-02-15 MED ORDER — METOCLOPRAMIDE HCL 5 MG/ML IJ SOLN: 5.0000 mg | Freq: Three times a day (TID) | INTRAMUSCULAR | Status: DC | PRN

## 2018-02-15 MED ORDER — ONDANSETRON HCL 4 MG/2ML IJ SOLN
INTRAMUSCULAR | Status: AC
  Filled 2018-02-15: qty 2

## 2018-02-15 MED ORDER — BUPIVACAINE IN DEXTROSE 0.75-8.25 % IT SOLN
INTRATHECAL | Status: DC | PRN
  Administered 2018-02-15: 2 mL via INTRATHECAL

## 2018-02-15 MED ORDER — DOCUSATE SODIUM 100 MG PO CAPS
100.0000 mg | ORAL_CAPSULE | Freq: Two times a day (BID) | ORAL | Status: DC
  Administered 2018-02-15 – 2018-02-17 (×4): 100 mg via ORAL
  Filled 2018-02-15 (×4): qty 1

## 2018-02-15 MED ORDER — HYDROCODONE-ACETAMINOPHEN 7.5-325 MG PO TABS: 1.0000 | ORAL_TABLET | Freq: Once | ORAL | Status: DC | PRN

## 2018-02-15 MED ORDER — LACTATED RINGERS IV SOLN
INTRAVENOUS | Status: DC
  Administered 2018-02-15: 08:00:00 via INTRAVENOUS

## 2018-02-15 MED ORDER — CEFAZOLIN SODIUM-DEXTROSE 1-4 GM/50ML-% IV SOLN
1.0000 g | Freq: Four times a day (QID) | INTRAVENOUS | Status: AC
  Administered 2018-02-15 (×2): 1 g via INTRAVENOUS
  Filled 2018-02-15 (×2): qty 50

## 2018-02-15 MED ORDER — MIDAZOLAM HCL 5 MG/5ML IJ SOLN
INTRAMUSCULAR | Status: DC | PRN
  Administered 2018-02-15 (×2): 1 mg via INTRAVENOUS

## 2018-02-15 MED ORDER — PHENYLEPHRINE 40 MCG/ML (10ML) SYRINGE FOR IV PUSH (FOR BLOOD PRESSURE SUPPORT)
PREFILLED_SYRINGE | INTRAVENOUS | Status: AC
  Filled 2018-02-15: qty 10

## 2018-02-15 MED ORDER — INFLUENZA VAC SPLIT HIGH-DOSE 0.5 ML IM SUSY
0.5000 mL | PREFILLED_SYRINGE | INTRAMUSCULAR | Status: AC
  Administered 2018-02-16: 0.5 mL via INTRAMUSCULAR
  Filled 2018-02-15: qty 0.5

## 2018-02-15 MED ORDER — PROPOFOL 10 MG/ML IV BOLUS
INTRAVENOUS | Status: AC
  Filled 2018-02-15: qty 20

## 2018-02-15 MED ORDER — ONDANSETRON HCL 4 MG PO TABS: 4.0000 mg | ORAL_TABLET | Freq: Four times a day (QID) | ORAL | Status: DC | PRN

## 2018-02-15 MED ORDER — ACETAMINOPHEN 10 MG/ML IV SOLN: 1000.0000 mg | Freq: Once | INTRAVENOUS | Status: DC | PRN

## 2018-02-15 MED ORDER — DIPHENHYDRAMINE HCL 12.5 MG/5ML PO ELIX: 12.5000 mg | ORAL_SOLUTION | ORAL | Status: DC | PRN

## 2018-02-15 MED ORDER — DEXAMETHASONE SODIUM PHOSPHATE 10 MG/ML IJ SOLN
INTRAMUSCULAR | Status: AC
  Filled 2018-02-15: qty 1

## 2018-02-15 MED ORDER — MEPERIDINE HCL 50 MG/ML IJ SOLN: 6.2500 mg | INTRAMUSCULAR | Status: DC | PRN

## 2018-02-15 MED ORDER — METOCLOPRAMIDE HCL 5 MG PO TABS: 5.0000 mg | ORAL_TABLET | Freq: Three times a day (TID) | ORAL | Status: DC | PRN

## 2018-02-15 MED ORDER — HYDROMORPHONE HCL 1 MG/ML IJ SOLN
0.5000 mg | INTRAMUSCULAR | Status: DC | PRN
  Administered 2018-02-16: 0.5 mg via INTRAVENOUS
  Filled 2018-02-15: qty 1

## 2018-02-15 MED ORDER — MENTHOL 3 MG MT LOZG: 1.0000 | LOZENGE | OROMUCOSAL | Status: DC | PRN

## 2018-02-15 MED ORDER — APIXABAN 5 MG PO TABS
5.0000 mg | ORAL_TABLET | Freq: Two times a day (BID) | ORAL | Status: DC
  Administered 2018-02-16 – 2018-02-17 (×3): 5 mg via ORAL
  Filled 2018-02-15 (×3): qty 1

## 2018-02-15 SURGICAL SUPPLY — 37 items
APL SKNCLS STERI-STRIP NONHPOA (GAUZE/BANDAGES/DRESSINGS) ×1
BAG SPEC THK2 15X12 ZIP CLS (MISCELLANEOUS)
BAG ZIPLOCK 12X15 (MISCELLANEOUS) IMPLANT
BENZOIN TINCTURE PRP APPL 2/3 (GAUZE/BANDAGES/DRESSINGS) ×1 IMPLANT
BLADE SAW SGTL 18X1.27X75 (BLADE) ×2 IMPLANT
COVER PERINEAL POST (MISCELLANEOUS) ×2 IMPLANT
COVER SURGICAL LIGHT HANDLE (MISCELLANEOUS) ×2 IMPLANT
COVER WAND RF STERILE (DRAPES) ×1 IMPLANT
CUP SECTOR GRIPTON 58MM (Orthopedic Implant) ×2 IMPLANT
DRAPE STERI IOBAN 125X83 (DRAPES) ×2 IMPLANT
DRAPE U-SHAPE 47X51 STRL (DRAPES) ×4 IMPLANT
DRSG AQUACEL AG ADV 3.5X10 (GAUZE/BANDAGES/DRESSINGS) ×2 IMPLANT
DURAPREP 26ML APPLICATOR (WOUND CARE) ×2 IMPLANT
ELECT REM PT RETURN 15FT ADLT (MISCELLANEOUS) ×2 IMPLANT
GAUZE XEROFORM 1X8 LF (GAUZE/BANDAGES/DRESSINGS) IMPLANT
GLOVE BIO SURGEON STRL SZ7.5 (GLOVE) ×7 IMPLANT
GLOVE BIOGEL PI IND STRL 8 (GLOVE) ×2 IMPLANT
GLOVE BIOGEL PI INDICATOR 8 (GLOVE) ×2
GLOVE ECLIPSE 8.0 STRL XLNG CF (GLOVE) ×2 IMPLANT
GOWN STRL REUS W/TWL XL LVL3 (GOWN DISPOSABLE) ×6 IMPLANT
HANDPIECE INTERPULSE COAX TIP (DISPOSABLE) ×2
HEAD CERAMIC 36 PLUS 8.5 12 14 (Hips) ×1 IMPLANT
HOLDER FOLEY CATH W/STRAP (MISCELLANEOUS) ×2 IMPLANT
LINER NEUTRAL 36X58 PLUS4 ×1 IMPLANT
PACK ANTERIOR HIP CUSTOM (KITS) ×2 IMPLANT
SET HNDPC FAN SPRY TIP SCT (DISPOSABLE) ×1 IMPLANT
STAPLER VISISTAT 35W (STAPLE) IMPLANT
STEM AMT HIGH CORAIL SZ13X135 (Stem) ×2 IMPLANT
STRIP CLOSURE SKIN 1/2X4 (GAUZE/BANDAGES/DRESSINGS) ×2 IMPLANT
SUT ETHIBOND NAB CT1 #1 30IN (SUTURE) ×2 IMPLANT
SUT MNCRL AB 4-0 PS2 18 (SUTURE) ×1 IMPLANT
SUT VIC AB 0 CT1 36 (SUTURE) ×2 IMPLANT
SUT VIC AB 1 CT1 36 (SUTURE) ×2 IMPLANT
SUT VIC AB 2-0 CT1 27 (SUTURE) ×4
SUT VIC AB 2-0 CT1 TAPERPNT 27 (SUTURE) ×2 IMPLANT
TRAY FOLEY MTR SLVR 16FR STAT (SET/KITS/TRAYS/PACK) ×2 IMPLANT
YANKAUER SUCT BULB TIP 10FT TU (MISCELLANEOUS) ×2 IMPLANT

## 2018-02-15 NOTE — Anesthesia Postprocedure Evaluation (Signed)
Anesthesia Post Note  Patient: [name] Hall  Procedure(s) Performed: LEFT TOTAL HIP ARTHROPLASTY ANTERIOR APPROACH (Left Hip)     Patient location during evaluation: PACU Anesthesia Type: Spinal Level of consciousness: oriented and awake and alert Pain management: pain level controlled Vital Signs Assessment: post-procedure vital signs reviewed and stable Respiratory status: spontaneous breathing, respiratory function stable and patient connected to nasal cannula oxygen Cardiovascular status: blood pressure returned to baseline and stable Postop Assessment: no headache, no backache and no apparent nausea or vomiting Anesthetic complications: no    Last Vitals:  Vitals:   02/15/18 1355 02/15/18 1414  BP: 102/65 110/60  Pulse: (!) 51 (!) 52  Resp: 16 16  Temp: (!) 36.2 C (!) 36.4 C  SpO2: 100% 100%    Last Pain:  Vitals:   02/15/18 1414  TempSrc: Oral  PainSc:                  Barnet Glasgow

## 2018-02-15 NOTE — H&P (Signed)
TOTAL HIP ADMISSION H&P  Patient is admitted for left total hip arthroplasty.  Subjective:  Chief Complaint: left hip pain  HPI: [name] Hall, 67 y.o. male, has a history of pain and functional disability in the left hip(s) due to arthritis and patient has failed non-surgical conservative treatments for greater than 12 weeks to include NSAID's and/or analgesics, corticosteriod injections, flexibility and strengthening excercises, use of assistive devices and activity modification.  Onset of symptoms was gradual starting 3 years ago with gradually worsening course since that time.The patient noted no past surgery on the left hip(s).  Patient currently rates pain in the left hip at 10 out of 10 with activity. Patient has night pain, worsening of pain with activity and weight bearing, trendelenberg gait, pain that interfers with activities of daily living, pain with passive range of motion and crepitus. Patient has evidence of subchondral cysts, subchondral sclerosis, periarticular osteophytes and joint space narrowing by imaging studies. This condition presents safety issues increasing the risk of falls.  There is no current active infection.  Patient Active Problem List   Diagnosis Date Noted  . Unilateral primary osteoarthritis, left hip 12/18/2017  . Unilateral primary osteoarthritis, right hip 12/18/2017   Past Medical History:  Diagnosis Date  . CAD (coronary artery disease)   . CHF (congestive heart failure) ([place])   . Complication of anesthesia    slow to wake after a surgery years ago ; but no issues with subsequent surgeries   . Fibromyalgia   . HLD (hyperlipidemia)   . Hx of CABG   . Hypertension    denies   . Ischemic cardiomyopathy   . 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 Myocardial infarction ([place])    2012    . Sleep apnea    no CPAP in use     Past Surgical History:  Procedure Laterality Date  . CARDIAC CATHETERIZATION  2012   X2 with 2 stents   .  CORONARY ARTERY BYPASS GRAFT  2007   quadruple   . ICD IMPLANT  2012  . partial discectomy   1995  . SPINAL FUSION  2003   lumbar     Current Facility-Administered Medications  Medication Dose Route Frequency Provider Last Rate Last Dose  . ceFAZolin (ANCEF) IVPB 2g/100 mL premix  2 g Intravenous On Call to OR Pete Pelt, PA-C      . chlorhexidine (HIBICLENS) 4 % liquid 4 application  60 mL Topical Once Pete Pelt, PA-C      . lactated ringers infusion   Intravenous Continuous Barnet Glasgow, MD 20 mL/hr at 02/15/18 9628     Allergies  Allergen Reactions  . Other     VEGAN ( no dairy , no meat, no fish, no eggs);     Social History   Tobacco Use  . Smoking status: Never Smoker  . Smokeless tobacco: Never Used  Substance Use Topics  . Alcohol use: Yes    Comment: occ    History reviewed. No pertinent family history.   Review of Systems  Musculoskeletal: Positive for joint pain.  All other systems reviewed and are negative.   Objective:  Physical Exam  Constitutional: He is oriented to person, place, and time. He appears well-developed and well-nourished.  HENT:  Head: Atraumatic.  Eyes: Pupils are equal, round, and reactive to light.  Neck: Normal range of motion.  Cardiovascular: Normal rate.  Respiratory: Effort normal.  GI: Soft.  Musculoskeletal:  Left hip: He exhibits decreased range of motion, decreased strength, tenderness and bony tenderness.  Neurological: He is alert and oriented to person, place, and time.  Skin: Skin is warm and dry.  Psychiatric: He has a normal mood and affect.    Vital signs in last 24 hours: Temp:  [98.2 F (36.8 C)] 98.2 F (36.8 C) (10/17 0746) Pulse Rate:  [62] 62 (10/17 0746) Resp:  [18] 18 (10/17 0746) BP: (122)/(71) 122/71 (10/17 0746) SpO2:  [100 %] 100 % (10/17 0746) Weight:  [86.2 kg] 86.2 kg (10/17 0809)  Labs:   Estimated body mass index is 27.26 kg/m as calculated from the following:    Height as of this encounter: 5\' 10"  (1.778 m).   Weight as of this encounter: 86.2 kg.   Imaging Review Plain radiographs demonstrate severe degenerative joint disease of the left hip(s). The bone quality appears to be excellent for age and reported activity level.    Preoperative templating of the joint replacement has been completed, documented, and submitted to the Operating Room personnel in order to optimize intra-operative equipment management.     Assessment/Plan:  End stage arthritis, left hip(s)  The patient history, physical examination, clinical judgement of the provider and imaging studies are consistent with end stage degenerative joint disease of the left hip(s) and total hip arthroplasty is deemed medically necessary. The treatment options including medical management, injection therapy, arthroscopy and arthroplasty were discussed at length. The risks and benefits of total hip arthroplasty were presented and reviewed. The risks due to aseptic loosening, infection, stiffness, dislocation/subluxation,  thromboembolic complications and other imponderables were discussed.  The patient acknowledged the explanation, agreed to proceed with the plan and consent was signed. Patient is being admitted for inpatient treatment for surgery, pain control, PT, OT, prophylactic antibiotics, VTE prophylaxis, progressive ambulation and ADL's and discharge planning.The patient is planning to be discharged home with home health services

## 2018-02-15 NOTE — Transfer of Care (Signed)
Immediate Anesthesia Transfer of Care Note  Patient: [name] Hall  Procedure(s) Performed: LEFT TOTAL HIP ARTHROPLASTY ANTERIOR APPROACH (Left Hip)  Patient Location: PACU  Anesthesia Type:Spinal  Level of Consciousness: drowsy  Airway & Oxygen Therapy: Patient Spontanous Breathing and Patient connected to face mask  Post-op Assessment: Report given to RN and Post -op Vital signs reviewed and stable  Post vital signs: Reviewed and stable  Last Vitals:  Vitals Value Taken Time  BP    Temp    Pulse    Resp    SpO2      Last Pain:  Vitals:   02/15/18 0809  TempSrc:   PainSc: 8       Patients Stated Pain Goal: 6 (41/28/78 6767)  Complications: No apparent anesthesia complications

## 2018-02-15 NOTE — Op Note (Signed)
NAME: [name] Hall, [name] Hall. MEDICAL RECORD [id number] ACCOUNT [id number] DATE OF BIRTH:04-11-1951 FACILITY: [facility] LOCATION: [facility] PHYSICIAN:Analyce Tavares Kerry Fort, MD  OPERATIVE REPORT  DATE OF PROCEDURE:  02/15/2018  PREOPERATIVE DIAGNOSIS:  Severe primary osteoarthritis and degenerative joint disease, left hip.  POSTOPERATIVE DIAGNOSIS:  Severe primary osteoarthritis and degenerative joint disease, left hip.  PROCEDURE:  Left total hip arthroplasty through direct anterior approach.  IMPLANTS:  DePuy Sector Gription acetabular component size 58, size 36+4 polyethylene liner, size 13 Corail femoral component with high offset, size 36+8.5 ceramic hip ball.  SURGEON:  Lind Guest. Ninfa Linden, MD  ASSISTANT:  Janine Ores, PA-C    ANESTHESIA:  Spinal.  ANTIBIOTICS:  Two grams IV Ancef.  ESTIMATED BLOOD LOSS:  750 mL.  COMPLICATIONS:  None.  INDICATIONS:  The patient is a 67 year old gentleman with debilitating arthritis involving both his hips.  He is someone that has some significant cardiac issues and has been cleared by cardiology for a total hip arthroplasty of at least his left hip  today.  Both hips show severe arthritis on x-ray with left hip much more stiff and more painful than his right hip.  He does wish to have this left hip replaced today.  He understands fully the risk of acute blood loss anemia, nerve or vessel injury,  fracture, infection, dislocation, DVT and implant failure.  He understands the goals are decreased pain, improved mobility, and overall improved quality of life.  DESCRIPTION OF PROCEDURE:  After informed consent was obtained and appropriate left hip was marked, he was brought to the operating room and sat up on the stretcher where spinal anesthesia was obtained.  A Foley catheter was then placed.  He was laid in  supine position on the stretcher.  We assessed his leg lengths and found them to be significantly longer on his right nonoperative side  than on the left side.  Again, his left side is shorter.  Radiographically, it shows that it is just only slightly  shorter, but clinically he is much more short.  Traction boots were then placed on both his feet.  He was placed supine on the Hana fracture table with a perineal post in place and both legs in in-line skeletal traction device and no traction applied.   His left operative hip was then prepped and draped with DuraPrep and sterile drapes.  Time-out was called, and he was identified as correct patient, correct left hip.  We then made an incision just inferior and posterior to the anterior iliac spine and  carried this obliquely down the leg.  We dissected down the tensor fascia lata muscle, and tensor fascia was then divided longitudinally to proceed with direct anterior approach to the hip.  We identified and cauterized circumflex vessels and identified  the hip capsule, opened up the hip capsule in an L-type format, finding a moderate joint effusion and significant periarticular osteophytes around the left hip.  We placed a Cobra retractor around the medial and lateral femoral neck and made our femoral  neck cut with an oscillating saw proximal to the lesser trochanter.  A large osteophyte somewhat obstructed our view.  Other than that, we then make the neck cut slightly lower.  We then completed this with an osteotome.  We placed a corkscrew guide in  the femoral head and removed the femoral head in its entirety and found it to be completely devoid of cartilage.  I then cleaned the acetabular remnants out of the acetabular labrum and other debris as  well as significant periarticular osteophytes.  We  then began reaming from a size 44 reamer, and then we jumped increments and then eventually went all the way up to a size 57 with all reamers under direct visualization, the last reamer under direct fluoroscopy so I could obtain my depth of reaming by  inclination and anteversion.  I was then  pleased to place the real DePuy Sector Gription acetabular component size 58 and a 36+4 neutral polyethylene liner due to the significant medialization that we had to perform on this hip.  We then turned attention  to the femur.  With the leg externally rotated at 120 degrees, we extended and adducted.  We were able to place a Mueller retractor medially and a Hohmann retractor around the greater trochanter, released the lateral joint capsule and used a box-cutting  osteotome to enter the femoral canal and a rongeur to lateralize.  Then began broaching from a size 8 broach using Corail broaching system, going up to size 13 based on how much his medialization was.  We did trial a high offset femoral neck and a  36+1.5 hip ball and reduced this in the acetabulum.  We felt like we needed significantly more leg length based on x-rays and clinical exam.  We dislocated the hip easily and then removed all trial components.  I then placed the real Corail high offset  femoral component, size 13, and then we went with a real 36+8.5 ceramic hip ball and reduced the acetabulum.  It was stable with range of motion and aggressive shucking.  It was very tight.  Leg lengths showed that we had achieved in getting him longer,  which we had wanted to do to match his other side.  We then removed all instrumentation and irrigated the hip with normal saline solution using pulsatile lavage.  We were able to close the joint capsule with interrupted #1 Ethibond suture, followed by  running 0 Vicryl and tensor fascia, 0 Vicryl in the deep tissue, 2-0 Vicryl subcutaneous tissue, and 4-0 Monocryl subcuticular stitch.  Steri-Strips were applied on the skin.  He was then taken off the Hana table and taken to recovery room in stable  condition.  All final counts were correct.  There were no complications noted.  Of note, Benita Stabile, PA-C, assisted the entire case.  Assistance was crucial for facilitating all aspects of this  case.  LN/NUANCE  D:02/15/2018 T:02/15/2018 JOB:[id number]

## 2018-02-15 NOTE — Brief Op Note (Signed)
02/15/2018  11:19 AM  PATIENT:  [name]  67 y.o. male  PRE-OPERATIVE DIAGNOSIS:  osteoarthritis left hip  POST-OPERATIVE DIAGNOSIS:  left hip osteoarthritis  PROCEDURE:  Procedure(s): LEFT TOTAL HIP ARTHROPLASTY ANTERIOR APPROACH (Left)  SURGEON:  Surgeon(s) and Role:    Mcarthur Rossetti, MD - Primary  PHYSICIAN ASSISTANT: Benita Stabile, PA-C  ANESTHESIA:   spinal  EBL:  700 mL   COUNTS:  YES  DICTATION: .Other Dictation: Dictation Number [id number]  PLAN OF CARE: Admit to inpatient   PATIENT DISPOSITION:  PACU - hemodynamically stable.   Delay start of Pharmacological VTE agent (>24hrs) due to surgical blood loss or risk of bleeding: no

## 2018-02-15 NOTE — Evaluation (Signed)
Physical Therapy Evaluation Patient Details Name: [name] Hall MRN: [id number] DOB: [birth year] Today's Date: 02/15/2018   History of Present Illness  67 YO male s/p L DA-THA on 02/15/18. PMH includes CAD with MI and CABGx4 2007, ischemic cardiomyopathy, CHF, ICD implant, sleep apnea, spinal fusion 2003.  Clinical Impression  Pt presents with L hip pain, difficulty performing mobility tasks, and decreased tolerance for ambulation due to L hip pain. Pt with burning pain along incision with ambulation. Pt to benefit from acute PT. Pt ambulated hallway distance with RW, and pt with nausea/dizziness after ambulation. BP and HR were 97/54 and 52 bpm. PT to progress mobility as tolerated, and will continue to follow acutely.      Follow Up Recommendations Follow surgeon's recommendation for DC plan and follow-up therapies;Supervision for mobility/OOB(HHPT )    Equipment Recommendations  Rolling walker with 5" wheels    Recommendations for Other Services       Precautions / Restrictions Precautions Precautions: Fall Restrictions Weight Bearing Restrictions: No Other Position/Activity Restrictions: WBAT       Mobility  Bed Mobility Overal bed mobility: Needs Assistance Bed Mobility: Supine to Sit     Supine to sit: Min assist;HOB elevated     General bed mobility comments: Min assist for trunk elevation. Verbal cuing for sequencing. Increased time and effort to get to EOB. Pt reported feeling "weird" upon first sitting up, and he did not explain this when asked, but feeling passed after a few seconds sitting EOB.  Transfers Overall transfer level: Needs assistance Equipment used: Rolling walker (2 wheeled) Transfers: Sit to/from Stand Sit to Stand: Min guard;From elevated surface         General transfer comment: Min guard for safety. Pt taking approximately 10 seconds to steady and weight shift. Verbal cuing for hand placement.   Ambulation/Gait Ambulation/Gait  assistance: Min guard Gait Distance (Feet): 50 Feet Assistive device: Rolling walker (2 wheeled) Gait Pattern/deviations: Step-to pattern;Decreased stride length;Trunk flexed Gait velocity: decr   General Gait Details: Min guard for safety. Verbal cuing for placement in RW, sequencing. After ambulation, pt with reports of nausea and dizziness. Pt sat in recliner, BP and HR 97/54 and 52 bpm. Pt given cool cloth and symptoms passed after approximately 2 minutes.  Stairs            Wheelchair Mobility    Modified Rankin (Stroke Patients Only)       Balance Overall balance assessment: Mild deficits observed, not formally tested                                           Pertinent Vitals/Pain Pain Assessment: 0-10 Pain Score: 4  Pain Location: L hip, back from positioning in bed  Pain Descriptors / Indicators: Sore;Aching Pain Intervention(s): Limited activity within patient's tolerance;Repositioned;Monitored during session;Ice applied    Home Living Family/patient expects to be discharged to:: Private residence Living Arrangements: Spouse/significant other Available Help at Discharge: Family;Available 24 hours/day Type of Home: House Home Access: Stairs to enter Entrance Stairs-Rails: None Entrance Stairs-Number of Steps: 4 Home Layout: One level Home Equipment: Shower seat - built in      Prior Function Level of Independence: Independent               Hand Dominance   Dominant Hand: Right    Extremity/Trunk Assessment   Upper Extremity Assessment Upper Extremity Assessment:  Overall WFL for tasks assessed    Lower Extremity Assessment Lower Extremity Assessment: Overall WFL for tasks assessed;LLE deficits/detail LLE Deficits / Details: suspected post-surgical hip weakness LLE Sensation: WNL    Cervical / Trunk Assessment Cervical / Trunk Assessment: Normal  Communication   Communication: No difficulties  Cognition  Arousal/Alertness: Awake/alert Behavior During Therapy: WFL for tasks assessed/performed Overall Cognitive Status: Within Functional Limits for tasks assessed                                        General Comments      Exercises Total Joint Exercises Ankle Circles/Pumps: AROM;Both;5 reps;Seated   Assessment/Plan    PT Assessment Patient needs continued PT services  PT Problem List Decreased strength;Pain;Decreased activity tolerance;Decreased knowledge of use of DME;Decreased balance;Decreased safety awareness;Decreased mobility       PT Treatment Interventions DME instruction;Therapeutic activities;Gait training;Therapeutic exercise;Patient/family education;Stair training;Balance training;Functional mobility training    PT Goals (Current goals can be found in the Care Plan section)  Acute Rehab PT Goals Patient Stated Goal: decrease pain  PT Goal Formulation: With patient Time For Goal Achievement: 03/01/18 Potential to Achieve Goals: Good    Frequency 7X/week   Barriers to discharge        Co-evaluation               AM-PAC PT "6 Clicks" Daily Activity  Outcome Measure Difficulty turning over in bed (including adjusting bedclothes, sheets and blankets)?: Unable Difficulty moving from lying on back to sitting on the side of the bed? : Unable Difficulty sitting down on and standing up from a chair with arms (e.g., wheelchair, bedside commode, etc,.)?: Unable Help needed moving to and from a bed to chair (including a wheelchair)?: A Little Help needed walking in hospital room?: A Little Help needed climbing 3-5 steps with a railing? : A Lot 6 Click Score: 11    End of Session Equipment Utilized During Treatment: Gait belt Activity Tolerance: Patient tolerated treatment well Patient left: in chair;with chair alarm set;with call bell/phone within reach;with family/visitor present;with SCD's reapplied;with nursing/sitter in room Nurse  Communication: Mobility status PT Visit Diagnosis: Other abnormalities of gait and mobility (R26.89);Difficulty in walking, not elsewhere classified (R26.2)    Time: 5093-2671 PT Time Calculation (min) (ACUTE ONLY): 26 min   Charges:   PT Evaluation $PT Eval Low Complexity: 1 Low PT Treatments $Gait Training: 8-22 mins       Julien Girt, PT Acute Rehabilitation Services Pager [phone]  Office [phone]  Crickett Abbett D Elonda Husky 02/15/2018, 7:19 PM

## 2018-02-15 NOTE — Anesthesia Procedure Notes (Signed)
Spinal  Patient location during procedure: OR Start time: 02/15/2018 9:55 AM End time: 02/15/2018 10:06 AM Staffing Anesthesiologist: Barnet Glasgow, MD Performed: anesthesiologist  Preanesthetic Checklist Completed: patient identified, surgical consent, pre-op evaluation, timeout performed, IV checked, risks and benefits discussed and monitors and equipment checked Spinal Block Patient position: sitting Prep: site prepped and draped and DuraPrep Patient monitoring: heart rate, cardiac monitor, continuous pulse ox and blood pressure Approach: right paramedian Location: L3-4 Injection technique: single-shot Needle Needle type: Pencan  Needle gauge: 24 G Needle length: 10 cm Needle insertion depth: 7 cm Assessment Sensory level: T4

## 2018-02-16 ENCOUNTER — Encounter (HOSPITAL_COMMUNITY): Payer: Self-pay | Admitting: Orthopaedic Surgery

## 2018-02-16 LAB — BASIC METABOLIC PANEL
ANION GAP: 8 (ref 5–15)
BUN: 13 mg/dL (ref 8–23)
CALCIUM: 8.5 mg/dL — AB (ref 8.9–10.3)
CHLORIDE: 103 mmol/L (ref 98–111)
CO2: 25 mmol/L (ref 22–32)
Creatinine, Ser: 0.91 mg/dL (ref 0.61–1.24)
Glucose, Bld: 143 mg/dL — ABNORMAL HIGH (ref 70–99)
POTASSIUM: 4.4 mmol/L (ref 3.5–5.1)
Sodium: 136 mmol/L (ref 135–145)

## 2018-02-16 LAB — CBC
HEMATOCRIT: 38.8 % — AB (ref 39.0–52.0)
HEMOGLOBIN: 12.5 g/dL — AB (ref 13.0–17.0)
MCH: 29.7 pg (ref 26.0–34.0)
MCHC: 32.2 g/dL (ref 30.0–36.0)
MCV: 92.2 fL (ref 80.0–100.0)
NRBC: 0 % (ref 0.0–0.2)
Platelets: 141 10*3/uL — ABNORMAL LOW (ref 150–400)
RBC: 4.21 MIL/uL — AB (ref 4.22–5.81)
RDW: 13.2 % (ref 11.5–15.5)
WBC: 9.9 10*3/uL (ref 4.0–10.5)

## 2018-02-16 NOTE — Progress Notes (Signed)
Physical Therapy Treatment Patient Details Name: [name] Hall MRN: [id number] DOB: August 03, 1950 Today's Date: 02/16/2018    History of Present Illness 67 YO male s/p L DA-THA on 02/15/18. PMH includes CAD with MI and CABGx4 2007, ischemic cardiomyopathy, CHF, ICD implant, sleep apnea, spinal fusion 2003.    PT Comments    Progressing with mobility.    Follow Up Recommendations  Follow surgeon's recommendation for DC plan and follow-up therapies     Equipment Recommendations  Rolling walker with 5" wheels    Recommendations for Other Services       Precautions / Restrictions Precautions Precautions: Fall Restrictions Weight Bearing Restrictions: No Other Position/Activity Restrictions: WBAT     Mobility  Bed Mobility               General bed mobility comments: oob in recliner  Transfers Overall transfer level: Needs assistance Equipment used: Rolling walker (2 wheeled) Transfers: Sit to/from Stand Sit to Stand: Min guard         General transfer comment: Close guard for safety. VCs safety, technique, hand/LE placement  Ambulation/Gait Ambulation/Gait assistance: Min guard Gait Distance (Feet): 125 Feet Assistive device: Rolling walker (2 wheeled) Gait Pattern/deviations: Step-to pattern;Step-through pattern;Decreased stride length     General Gait Details: Min guard for safety. Slow gait speed. Cues for proper use of RW.    Stairs             Wheelchair Mobility    Modified Rankin (Stroke Patients Only)       Balance Overall balance assessment: Mild deficits observed, not formally tested                                          Cognition Arousal/Alertness: Awake/alert Behavior During Therapy: WFL for tasks assessed/performed Overall Cognitive Status: Within Functional Limits for tasks assessed                                        Exercises Total Joint Exercises Ankle Circles/Pumps:  AROM;Both;Seated;10 reps Quad Sets: AROM;Both;10 reps;Seated Heel Slides: AAROM;Left;10 reps;Seated Hip ABduction/ADduction: AAROM;Left;10 reps;Seated    General Comments        Pertinent Vitals/Pain Pain Assessment: 0-10 Pain Score: 5  Faces Pain Scale: Hurts even more Pain Location: L hip, back  Pain Descriptors / Indicators: Sore;Aching;Discomfort;Grimacing Pain Intervention(s): Monitored during session;Repositioned;Ice applied    Home Living Family/patient expects to be discharged to:: Private residence Living Arrangements: Spouse/significant other Available Help at Discharge: Family;Available 24 hours/day Type of Home: House Home Access: Stairs to enter Entrance Stairs-Rails: None Home Layout: One level Home Equipment: Shower seat - built in      Prior Function Level of Independence: Independent          PT Goals (current goals can now be found in the care plan section) Acute Rehab PT Goals Patient Stated Goal: decrease pain  Progress towards PT goals: Progressing toward goals    Frequency    7X/week      PT Plan Current plan remains appropriate    Co-evaluation              AM-PAC PT "6 Clicks" Daily Activity  Outcome Measure  Difficulty turning over in bed (including adjusting bedclothes, sheets and blankets)?: Unable Difficulty moving from lying on back to sitting  on the side of the bed? : Unable Difficulty sitting down on and standing up from a chair with arms (e.g., wheelchair, bedside commode, etc,.)?: Unable Help needed moving to and from a bed to chair (including a wheelchair)?: A Little Help needed walking in hospital room?: A Little Help needed climbing 3-5 steps with a railing? : A Lot 6 Click Score: 11    End of Session Equipment Utilized During Treatment: Gait belt Activity Tolerance: Patient tolerated treatment well Patient left: in chair;with call bell/phone within reach;with family/visitor present   PT Visit Diagnosis: Other  abnormalities of gait and mobility (R26.89);Difficulty in walking, not elsewhere classified (R26.2)     Time: 6948-5462 PT Time Calculation (min) (ACUTE ONLY): 25 min  Charges:  $Gait Training: 8-22 mins $Therapeutic Exercise: 8-22 mins             Weston Anna, PT Acute Rehabilitation Services Pager: [phone] Office: [phone]

## 2018-02-16 NOTE — Care Management Note (Signed)
Case Management Note  Patient Details  Name: GORDON VANDUNK MRN: [id number] Date of Birth: [birth year]  Subjective/Objective:    Discharge planning, spoke with patient at bedside. Have chosen [facility] for [facility] PT, evaluate and treat. Awaiting final orders.                Action/Plan: Contacted [facility] for referral, they have accepted pending orders placed. Needs RW, contacted [facility] to deliver to room. [phone]  Expected Discharge Date:                  Expected Discharge Plan:  [place]  In-House Referral:  NA  Discharge planning Services  CM Consult  Post Acute Care Choice:  Durable Medical Equipment, Home Health Choice offered to:  Patient  DME Arranged:  Walker rolling DME Agency:  [place]:  PT [place] Agency:  [facility] (formerly [facility])  Status of Service:  Completed, signed off  If discussed at [employer] of Stay Meetings, dates discussed:    Additional Comments:  Guadalupe Maple, RN 02/16/2018, 10:42 AM

## 2018-02-16 NOTE — Progress Notes (Signed)
Physical Therapy Treatment Patient Details Name: [name] Hall MRN: [id number] DOB: May 18, 1950 Today's Date: 02/16/2018    History of Present Illness 67 YO male s/p L DA-THA on 02/15/18. PMH includes CAD with MI and CABGx4 2007, ischemic cardiomyopathy, CHF, ICD implant, sleep apnea, spinal fusion 2003.    PT Comments    Progressing slowly with mobility. Pt c/o moderate back and L hip pain on today. He stated he had a bad night last night. Will follow and progress activity as tolerated.    Follow Up Recommendations  Follow surgeon's recommendation for DC plan and follow-up therapies     Equipment Recommendations  Rolling walker with 5" wheels    Recommendations for Other Services       Precautions / Restrictions Precautions Precautions: Fall Restrictions Weight Bearing Restrictions: No Other Position/Activity Restrictions: WBAT     Mobility  Bed Mobility               General bed mobility comments: oob in recliner  Transfers Overall transfer level: Needs assistance Equipment used: Rolling walker (2 wheeled) Transfers: Sit to/from Stand Sit to Stand: Min guard         General transfer comment: Close guard for safety. VCs safety, technique, hand/LE placement  Ambulation/Gait Ambulation/Gait assistance: Min guard Gait Distance (Feet): 60 Feet Assistive device: Rolling walker (2 wheeled) Gait Pattern/deviations: Step-to pattern;Step-through pattern;Decreased step length - left;Decreased step length - right;Decreased stride length     General Gait Details: Min guard for safety. Had pt's wife follow with recliner for safety. Pt denied lightheadeness/dizziness, nausea on today.    Stairs             Wheelchair Mobility    Modified Rankin (Stroke Patients Only)       Balance Overall balance assessment: Mild deficits observed, not formally tested                                          Cognition Arousal/Alertness:  Awake/alert Behavior During Therapy: WFL for tasks assessed/performed Overall Cognitive Status: Within Functional Limits for tasks assessed                                        Exercises Total Joint Exercises Ankle Circles/Pumps: AROM;Both;Seated;10 reps Quad Sets: AROM;Both;10 reps;Seated Heel Slides: AAROM;Left;10 reps;Seated Hip ABduction/ADduction: AAROM;Left;10 reps;Seated    General Comments        Pertinent Vitals/Pain Pain Assessment: 0-10 Pain Score: 7  Pain Location: L hip, back  Pain Descriptors / Indicators: Sore;Aching;Discomfort;Grimacing Pain Intervention(s): Monitored during session;Repositioned;Ice applied    Home Living                      Prior Function            PT Goals (current goals can now be found in the care plan section) Progress towards PT goals: Progressing toward goals    Frequency    7X/week      PT Plan Current plan remains appropriate    Co-evaluation              AM-PAC PT "6 Clicks" Daily Activity  Outcome Measure  Difficulty turning over in bed (including adjusting bedclothes, sheets and blankets)?: Unable Difficulty moving from lying on back to sitting on the side of  the bed? : Unable Difficulty sitting down on and standing up from a chair with arms (e.g., wheelchair, bedside commode, etc,.)?: Unable Help needed moving to and from a bed to chair (including a wheelchair)?: A Little Help needed walking in hospital room?: A Little Help needed climbing 3-5 steps with a railing? : A Lot 6 Click Score: 11    End of Session Equipment Utilized During Treatment: Gait belt Activity Tolerance: Patient tolerated treatment well Patient left: in chair;with call bell/phone within reach;with family/visitor present   PT Visit Diagnosis: Other abnormalities of gait and mobility (R26.89);Difficulty in walking, not elsewhere classified (R26.2)     Time: 6435-3912 PT Time Calculation (min) (ACUTE  ONLY): 28 min  Charges:  $Gait Training: 8-22 mins $Therapeutic Exercise: 8-22 mins                          Weston Anna, PT Acute Rehabilitation Services Pager: [phone] Office: [phone]

## 2018-02-16 NOTE — Evaluation (Signed)
Occupational Therapy Evaluation Patient Details Name: [name] Hall MRN: [id number] DOB: 02-Feb-1951 Today's Date: 02/16/2018    History of Present Illness 67 YO male s/p L DA-THA on 02/15/18. PMH includes CAD with MI and CABGx4 2007, ischemic cardiomyopathy, CHF, ICD implant, sleep apnea, spinal fusion 2003.   Clinical Impression   Pt admitted with the above diagnoses and presents with below problem list. Pt will benefit from continued acute OT to address the below listed deficits and maximize independence with basic ADLs prior to d/c home. PTA pt was independent with ADLs. Pt is currently min A with LB ADLs and shower transfers, min guard for toilet transfers and functional mobility. Spouse present and included in ADL education.      Follow Up Recommendations  No OT follow up;Supervision/Assistance - 24 hour    Equipment Recommendations  3 in 1 bedside commode    Recommendations for Other Services       Precautions / Restrictions Precautions Precautions: Fall Restrictions Weight Bearing Restrictions: No Other Position/Activity Restrictions: WBAT       Mobility Bed Mobility               General bed mobility comments: oob in recliner  Transfers Overall transfer level: Needs assistance Equipment used: Rolling walker (2 wheeled) Transfers: Sit to/from Stand Sit to Stand: Min guard         General transfer comment: Close guard for safety. VCs safety, technique, hand/LE placement    Balance Overall balance assessment: Mild deficits observed, not formally tested                                         ADL either performed or assessed with clinical judgement   ADL Overall ADL's : Needs assistance/impaired Eating/Feeding: Set up;Sitting   Grooming: Min guard;Wash/dry hands;Standing   Upper Body Bathing: Set up;Sitting   Lower Body Bathing: Minimal assistance;Sit to/from stand   Upper Body Dressing : Set up;Sitting   Lower Body  Dressing: Minimal assistance;Sit to/from stand   Toilet Transfer: Min guard;Ambulation;RW   Toileting- Water quality scientist and Hygiene: Min guard;Sit to/from stand   Tub/ Shower Transfer: Walk-in shower;Minimal assistance;Ambulation;3 in 1;Rolling walker   Functional mobility during ADLs: Min guard;Rolling walker General ADL Comments: Pt completed in-room functional mobility and grooming task standing at sink. Discussed LB AE and technique as pt unable to access L foot in seated position.      Vision         Perception     Praxis      Pertinent Vitals/Pain Pain Assessment: Faces Pain Score: 7  Faces Pain Scale: Hurts even more Pain Location: L hip, back  Pain Descriptors / Indicators: Sore;Aching;Discomfort;Grimacing Pain Intervention(s): Limited activity within patient's tolerance;Monitored during session;Repositioned;Ice applied     Hand Dominance Right   Extremity/Trunk Assessment Upper Extremity Assessment Upper Extremity Assessment: Overall WFL for tasks assessed   Lower Extremity Assessment Lower Extremity Assessment: Defer to PT evaluation   Cervical / Trunk Assessment Cervical / Trunk Assessment: Normal   Communication Communication Communication: No difficulties   Cognition Arousal/Alertness: Awake/alert Behavior During Therapy: WFL for tasks assessed/performed Overall Cognitive Status: Within Functional Limits for tasks assessed                                     General Comments  Exercises Total Joint Exercises Ankle Circles/Pumps: AROM;Both;Seated;10 reps Quad Sets: AROM;Both;10 reps;Seated Heel Slides: AAROM;Left;10 reps;Seated Hip ABduction/ADduction: AAROM;Left;10 reps;Seated   Shoulder Instructions      Home Living Family/patient expects to be discharged to:: Private residence Living Arrangements: Spouse/significant other Available Help at Discharge: Family;Available 24 hours/day Type of Home: House Home Access:  Stairs to enter [employer] of Steps: 4 Entrance Stairs-Rails: None Home Layout: One level     Bathroom Shower/Tub: Occupational psychologist: Standard Bathroom Accessibility: Yes   Home Equipment: Shower seat - built in          Prior Functioning/Environment Level of Independence: Independent                 OT Problem List: Impaired balance (sitting and/or standing);Decreased knowledge of use of DME or AE;Decreased knowledge of precautions;Pain      OT Treatment/Interventions: Self-care/ADL training;DME and/or AE instruction;Therapeutic activities;Patient/family education;Balance training    OT Goals(Current goals can be found in the care plan section) Acute Rehab OT Goals Patient Stated Goal: decrease pain  OT Goal Formulation: With patient/family Time For Goal Achievement: 02/23/18 Potential to Achieve Goals: Good ADL Goals Pt Will Perform Lower Body Bathing: with modified independence;sit to/from stand;with adaptive equipment Pt Will Perform Lower Body Dressing: with modified independence;with adaptive equipment;sit to/from stand Pt Will Transfer to Toilet: with modified independence;ambulating Pt Will Perform Toileting - Clothing Manipulation and hygiene: with modified independence;sit to/from stand Pt Will Perform Tub/Shower Transfer: Shower transfer;with supervision;ambulating;rolling walker;3 in 1 Additional ADL Goal #1: Pt will complete bed mobility at mod I level to prepare for OOB ADLs.  OT Frequency: Min 2X/week   Barriers to D/C:            Co-evaluation              AM-PAC PT "6 Clicks" Daily Activity     Outcome Measure Help from another person eating meals?: None Help from another person taking care of personal grooming?: None Help from another person toileting, which includes using toliet, bedpan, or urinal?: None Help from another person bathing (including washing, rinsing, drying)?: A Little Help from another person  to put on and taking off regular upper body clothing?: None Help from another person to put on and taking off regular lower body clothing?: A Little 6 Click Score: 22   End of Session Equipment Utilized During Treatment: Rolling walker  Activity Tolerance: Patient tolerated treatment well Patient left: in chair;with call bell/phone within reach;with family/visitor present  OT Visit Diagnosis: Unsteadiness on feet (R26.81);Pain Pain - Right/Left: Left Pain - part of body: Hip                Time: 1259-1318 OT Time Calculation (min): 19 min Charges:  OT General Charges $OT Visit: 1 Visit OT Evaluation $OT Eval Low Complexity: [place], OT Acute Rehabilitation Services Pager: [phone] Office: [phone]   Hortencia Pilar 02/16/2018, 1:32 PM

## 2018-02-17 MED ORDER — OXYCODONE HCL 5 MG PO TABS: 5.0000 mg | ORAL_TABLET | ORAL | 0 refills | Status: DC | PRN

## 2018-02-17 MED ORDER — METHOCARBAMOL 500 MG PO TABS: 500.0000 mg | ORAL_TABLET | Freq: Four times a day (QID) | ORAL | 0 refills | Status: DC | PRN

## 2018-02-17 NOTE — Discharge Summary (Signed)
Patient ID: [name] Hall MRN: [id number] DOB/AGE: [birth year] 67 y.o.  Admit date: 02/15/2018 Discharge date: 02/17/2018  Admission Diagnoses:  Principal Problem:   Unilateral primary osteoarthritis, left hip Active Problems:   Status post total replacement of left hip   Discharge Diagnoses:  Same  Past Medical History:  Diagnosis Date  . CAD (coronary artery disease)   . CHF (congestive heart failure) ([place])   . Complication of anesthesia    slow to wake after a surgery years ago ; but no issues with subsequent surgeries   . Fibromyalgia   . HLD (hyperlipidemia)   . Hx of CABG   . Hypertension    denies   . Ischemic cardiomyopathy   . 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 Myocardial infarction ([place])    2012    . Sleep apnea    no CPAP in use     Surgeries: Procedure(s): LEFT TOTAL HIP ARTHROPLASTY ANTERIOR APPROACH on 02/15/2018   Consultants:   Discharged Condition: Improved  Hospital Course: [name] Hall is an 67 y.o. male who was admitted 02/15/2018 for operative treatment ofUnilateral primary osteoarthritis, left hip. Patient has severe unremitting pain that affects sleep, daily activities, and work/hobbies. After pre-op clearance the patient was taken to the operating room on 02/15/2018 and underwent  Procedure(s): LEFT TOTAL HIP ARTHROPLASTY ANTERIOR APPROACH.    Patient was given perioperative antibiotics:  Anti-infectives (From admission, onward)   Start     Dose/Rate Route Frequency Ordered Stop   02/15/18 1600  ceFAZolin (ANCEF) IVPB 1 g/50 mL premix     1 g 100 mL/hr over 30 Minutes Intravenous Every 6 hours 02/15/18 1412 02/15/18 2245   02/15/18 0745  ceFAZolin (ANCEF) IVPB 2g/100 mL premix     2 g 200 mL/hr over 30 Minutes Intravenous On call to O.R. 02/15/18 0740 02/15/18 1006       Patient was given sequential compression devices, early ambulation, and chemoprophylaxis to prevent DVT.  Patient benefited maximally from  hospital stay and there were no complications.    Recent vital signs:  Patient Vitals for the past 24 hrs:  BP Temp Temp src Pulse Resp SpO2  02/17/18 0554 (!) 95/50 98.5 F (36.9 C) Oral 74 - 100 %  02/17/18 0320 (!) 98/58 98.5 F (36.9 C) Oral 77 - 100 %  02/16/18 2150 93/61 98 F (36.7 C) Oral 73 17 100 %  02/16/18 1500 101/60 97.9 F (36.6 C) Oral 64 16 99 %     Recent laboratory studies:  Recent Labs    02/16/18 0449  WBC 9.9  HGB 12.5*  HCT 38.8*  PLT 141*  NA 136  K 4.4  CL 103  CO2 25  BUN 13  CREATININE 0.91  GLUCOSE 143*  CALCIUM 8.5*     Discharge Medications:   Allergies as of 02/17/2018      Reactions   Other    VEGAN ( no dairy , no meat, no fish, no eggs);       Medication List    TAKE these medications   aspirin EC 81 MG tablet Take 81 mg by mouth daily.   CIALIS 5 MG tablet Generic drug:  tadalafil Take 5 mg by mouth daily.   ELIQUIS 5 MG Tabs tablet Generic drug:  apixaban Take 5 mg by mouth 2 (two) times daily.   lisinopril 5 MG tablet Commonly known as:  PRINIVIL,ZESTRIL Take 5 mg by mouth daily.   methocarbamol 500 MG tablet  Commonly known as:  ROBAXIN Take 1 tablet (500 mg total) by mouth every 6 (six) hours as needed for muscle spasms.   metoprolol succinate 50 MG 24 hr tablet Commonly known as:  TOPROL-XL Take 50 mg by mouth daily.   MULTI-VITAMINS Tabs Take 1 tablet by mouth daily.   oxyCODONE 5 MG immediate release tablet Commonly known as:  Oxy IR/ROXICODONE Take 1-2 tablets (5-10 mg total) by mouth every 4 (four) hours as needed for moderate pain (pain score 4-6).   VITAMIN B-12 PO Take 2,500 mcg by mouth every other day.   Vitamin D3 2000 units Tabs Take 2,000 Units by mouth 3 (three) times a week.            Durable Medical Equipment  (From admission, onward)         Start     Ordered   02/16/18 0811  For home use only DME Walker rolling  Once    Question:  Patient needs a walker to treat with  the following condition  Answer:  History of orthopedic surgery   02/16/18 0811          Diagnostic Studies: Dg Pelvis Portable  Result Date: 02/15/2018 CLINICAL DATA:  Post LEFT total hip replacement EXAM: PORTABLE PELVIS 1-2 VIEWS COMPARISON:  Portable exam 1140 hours compared to intraoperative images of 02/15/2018 FINDINGS: LEFT hip prosthesis identified. Bones demineralized. No acute fracture, dislocation or bone destruction identified on single AP view. Degenerative changes of the RIGHT joint space also seen. Postsurgical changes of the soft tissues in the LEFT hip region also identified. IMPRESSION: LEFT hip prosthesis without acute complication. Osseous demineralization with degenerative changes of the RIGHT hip. Electronically Signed   By: Lavonia Dana M.D.   On: 02/15/2018 12:28   Dg C-arm 1-60 Min-no Report  Result Date: 02/15/2018 Fluoroscopy was utilized by the requesting physician.  No radiographic interpretation.   Dg Hip Operative Unilat W Or W/o Pelvis Left  Result Date: 02/15/2018 CLINICAL DATA:  LEFT hip replacement EXAM: OPERATIVE LEFT HIP (WITH PELVIS IF PERFORMED) 4 VIEWS TECHNIQUE: Fluoroscopic spot image(s) were submitted for interpretation post-operatively. COMPARISON:  11/01/2017 FLUOROSCOPY TIME:  0 minutes 20 seconds Dose: 3.1 mGy FINDINGS: Images demonstrate resection of the proximal LEFT femur and placement of a LEFT hip prosthesis. No fracture or dislocation. Bones appear demineralized. Degenerative changes of the RIGHT hip joint space noted. IMPRESSION: LEFT hip replacement without acute abnormalities. Electronically Signed   By: Lavonia Dana M.D.   On: 02/15/2018 12:21    Disposition: Discharge disposition: 01-Home or Self Care         Follow-up Information    [facility] Follow up.   Specialty:  [place]:  physical therapy Contact information: [place] [address] [phone]        Mcarthur Rossetti, MD Follow up in 2 week(s).   Specialty:  Orthopedic Surgery Contact information: [place] [address] [phone]            Signed: Mcarthur Rossetti 02/17/2018, 11:15 AM

## 2018-02-17 NOTE — Plan of Care (Signed)
Pt is stable. Pain management in progress, effective. Pt is + for voiding. Problem: Education: Goal: Verbalization of understanding the information provided (i.e., activity precautions, restrictions, etc) will improve Outcome: Progressing   Problem: Activity: Goal: Ability to ambulate and perform ADLs will improve Outcome: Progressing   Problem: Clinical Measurements: Goal: Postoperative complications will be avoided or minimized Outcome: Progressing   Problem: Self-Concept: Goal: Ability to maintain and perform role responsibilities to the fullest extent possible will improve Outcome: Progressing   Problem: Pain Management: Goal: Pain level will decrease Outcome: Progressing

## 2018-02-17 NOTE — Progress Notes (Signed)
Physical Therapy Treatment Patient Details Name: [name] Hall MRN: [id number] DOB: [birth year] Today's Date: 02/17/2018    History of Present Illness 67 YO male s/p L DA-THA on 02/15/18. PMH includes CAD with MI and CABGx4 2007, ischemic cardiomyopathy, CHF, ICD implant, sleep apnea, spinal fusion 2003.    PT Comments    Pt is progressing well with mobility and is ready to DC home from PT standpoint. He ambulated 150' with RW, no loss of balance. Reviewed HEP with pt and his wife. He declined stair training, stated he was familiar with technique.    Follow Up Recommendations  Follow surgeon's recommendation for DC plan and follow-up therapies     Equipment Recommendations  Rolling walker with 5" wheels    Recommendations for Other Services       Precautions / Restrictions Precautions Precautions: Fall Restrictions Weight Bearing Restrictions: No Other Position/Activity Restrictions: WBAT     Mobility  Bed Mobility Overal bed mobility: Needs Assistance             General bed mobility comments: up in bathroom  Transfers Overall transfer level: Needs assistance Equipment used: Rolling walker (2 wheeled) Transfers: Sit to/from Stand Sit to Stand: Supervision         General transfer comment: VCs safety, technique, hand/LE placement  Ambulation/Gait Ambulation/Gait assistance: Supervision Gait Distance (Feet): 150 Feet Assistive device: Rolling walker (2 wheeled) Gait Pattern/deviations: Step-to pattern;Step-through pattern;Decreased stride length Gait velocity: decr   General Gait Details: VCs to roll rather than lift RW (pt prefers to lift it), no loss of balance, distance limited by pain   Stairs Stairs: (pt declined stair training, stated he knows how to go up/down stairs, verbally reviewed technique with pt)           Wheelchair Mobility    Modified Rankin (Stroke Patients Only)       Balance Overall balance assessment: Mild deficits  observed, not formally tested                                          Cognition Arousal/Alertness: Awake/alert Behavior During Therapy: WFL for tasks assessed/performed Overall Cognitive Status: Within Functional Limits for tasks assessed                                        Exercises Total Joint Exercises Ankle Circles/Pumps: AROM;Both;Seated;10 reps Quad Sets: AROM;Both;10 reps;Seated Gluteal Sets: AROM;Left;10 reps;Supine Short Arc Quad: AROM;Left;10 reps;Supine Heel Slides: AAROM;Left;10 reps;Supine Hip ABduction/ADduction: AAROM;Left;10 reps;Supine Long Arc Quad: AROM;Left;10 reps;Seated    General Comments        Pertinent Vitals/Pain Pain Score: 6  Pain Location: L hip, back  Pain Descriptors / Indicators: Sore;Aching;Discomfort;Grimacing Pain Intervention(s): Monitored during session;Limited activity within patient's tolerance;Premedicated before session;Ice applied;Patient requesting pain meds-RN notified    Home Living                      Prior Function            PT Goals (current goals can now be found in the care plan section) Acute Rehab PT Goals Patient Stated Goal: decrease pain, play golf PT Goal Formulation: With patient Time For Goal Achievement: 03/01/18 Potential to Achieve Goals: Good Progress towards PT goals: Progressing toward goals    Frequency  7X/week      PT Plan Current plan remains appropriate    Co-evaluation              AM-PAC PT "6 Clicks" Daily Activity  Outcome Measure  Difficulty turning over in bed (including adjusting bedclothes, sheets and blankets)?: Unable Difficulty moving from lying on back to sitting on the side of the bed? : Unable Difficulty sitting down on and standing up from a chair with arms (e.g., wheelchair, bedside commode, etc,.)?: A Little Help needed moving to and from a bed to chair (including a wheelchair)?: A Little Help needed walking in  hospital room?: A Little Help needed climbing 3-5 steps with a railing? : A Little 6 Click Score: 14    End of Session Equipment Utilized During Treatment: Gait belt Activity Tolerance: Patient tolerated treatment well Patient left: in chair;with call bell/phone within reach;with family/visitor present Nurse Communication: Mobility status PT Visit Diagnosis: Other abnormalities of gait and mobility (R26.89);Difficulty in walking, not elsewhere classified (R26.2)     Time: 3299-2426 PT Time Calculation (min) (ACUTE ONLY): 25 min  Charges:  $Gait Training: 8-22 mins $Therapeutic Exercise: 8-22 mins                     Blondell Reveal Kistler PT 02/17/2018  Acute Rehabilitation Services Pager [phone] Office [phone]

## 2018-02-17 NOTE — Progress Notes (Signed)
Subjective: 2 Days Post-Op Procedure(s) (LRB): LEFT TOTAL HIP ARTHROPLASTY ANTERIOR APPROACH (Left) Patient reports pain as moderate.    Objective: Vital signs in last 24 hours: Temp:  [97.9 F (36.6 C)-98.5 F (36.9 C)] 98.5 F (36.9 C) (10/19 0554) Pulse Rate:  [64-77] 74 (10/19 0554) Resp:  [16-17] 17 (10/18 2150) BP: (93-101)/(50-61) 95/50 (10/19 0554) SpO2:  [99 %-100 %] 100 % (10/19 0554)  Intake/Output from previous day: 10/18 0701 - 10/19 0700 In: 2606.8 [P.O.:780; I.V.:1826.8] Out: 3250 [Urine:3250] Intake/Output this shift: Total I/O In: -  Out: 250 [Urine:250]  Recent Labs    02/16/18 0449  HGB 12.5*   Recent Labs    02/16/18 0449  WBC 9.9  RBC 4.21*  HCT 38.8*  PLT 141*   Recent Labs    02/16/18 0449  NA 136  K 4.4  CL 103  CO2 25  BUN 13  CREATININE 0.91  GLUCOSE 143*  CALCIUM 8.5*   No results for input(s): LABPT, INR in the last 72 hours.  Sensation intact distally Intact pulses distally Dorsiflexion/Plantar flexion intact Incision: dressing C/D/I  Assessment/Plan: 2 Days Post-Op Procedure(s) (LRB): LEFT TOTAL HIP ARTHROPLASTY ANTERIOR APPROACH (Left) Up with therapy Discharge home with home health today.    Mcarthur Rossetti 02/17/2018, 11:13 AM

## 2018-02-17 NOTE — Discharge Instructions (Signed)
Information on my medicine - ELIQUIS (apixaban)   Why was Eliquis prescribed for you? Eliquis was prescribed for you to reduce the risk of blood clots forming after orthopedic surgery.    What do You need to know about Eliquis? Take your Eliquis TWICE DAILY - one tablet in the morning and one tablet in the evening with or without food.  It would be best to take the dose about the same time each day.  If you have difficulty swallowing the tablet whole please discuss with your pharmacist how to take the medication safely.  Take Eliquis exactly as prescribed by your doctor and DO NOT stop taking Eliquis without talking to the doctor who prescribed the medication.  Stopping without other medication to take the place of Eliquis may increase your risk of developing a clot.  After discharge, you should have regular check-up appointments with your healthcare provider that is prescribing your Eliquis.  What do you do if you miss a dose? If a dose of ELIQUIS is not taken at the scheduled time, take it as soon as possible on the same day and twice-daily administration should be resumed.  The dose should not be doubled to make up for a missed dose.  Do not take more than one tablet of ELIQUIS at the same time.  Important Safety Information A possible side effect of Eliquis is bleeding. You should call your healthcare provider right away if you experience any of the following: ? Bleeding from an injury or your nose that does not stop. ? Unusual colored urine (red or dark brown) or unusual colored stools (red or black). ? Unusual bruising for unknown reasons. ? A serious fall or if you hit your head (even if there is no bleeding).  Some medicines may interact with Eliquis and might increase your risk of bleeding or clotting while on Eliquis. To help avoid this, consult your healthcare provider or pharmacist prior to using any new prescription or non-prescription medications, including  herbals, vitamins, non-steroidal anti-inflammatory drugs (NSAIDs) and supplements.  This website has more information on Eliquis (apixaban): http://www.eliquis.com/eliquis/home  INSTRUCTIONS AFTER JOINT REPLACEMENT   o Remove items at home which could result in a fall. This includes throw rugs or furniture in walking pathways o ICE to the affected joint every three hours while awake for 30 minutes at a time, for at least the first 3-5 days, and then as needed for pain and swelling.  Continue to use ice for pain and swelling. You may notice swelling that will progress down to the foot and ankle.  This is normal after surgery.  Elevate your leg when you are not up walking on it.   o Continue to use the breathing machine you got in the hospital (incentive spirometer) which will help keep your temperature down.  It is common for your temperature to cycle up and down following surgery, especially at night when you are not up moving around and exerting yourself.  The breathing machine keeps your lungs expanded and your temperature down.   DIET:  As you were doing prior to hospitalization, we recommend a well-balanced diet.  DRESSING / WOUND CARE / SHOWERING  Keep the surgical dressing until follow up.  The dressing is water proof, so you can shower without any extra covering.  IF THE DRESSING FALLS OFF or the wound gets wet inside, change the dressing with sterile gauze.  Please use good hand washing techniques before changing the dressing.  Do not use any lotions  or creams on the incision until instructed by your surgeon.    ACTIVITY  o Increase activity slowly as tolerated, but follow the weight bearing instructions below.   o No driving for 6 weeks or until further direction given by your physician.  You cannot drive while taking narcotics.  o No lifting or carrying greater than 10 lbs. until further directed by your surgeon. o Avoid periods of inactivity such as sitting longer than an hour when  not asleep. This helps prevent blood clots.  o You may return to work once you are authorized by your doctor.     WEIGHT BEARING   Weight bearing as tolerated with assist device (walker, cane, etc) as directed, use it as long as suggested by your surgeon or therapist, typically at least 4-6 weeks.   EXERCISES  Results after joint replacement surgery are often greatly improved when you follow the exercise, range of motion and muscle strengthening exercises prescribed by your doctor. Safety measures are also important to protect the joint from further injury. Any time any of these exercises cause you to have increased pain or swelling, decrease what you are doing until you are comfortable again and then slowly increase them. If you have problems or questions, call your caregiver or physical therapist for advice.   Rehabilitation is important following a joint replacement. After just a few days of immobilization, the muscles of the leg can become weakened and shrink (atrophy).  These exercises are designed to build up the tone and strength of the thigh and leg muscles and to improve motion. Often times heat used for twenty to thirty minutes before working out will loosen up your tissues and help with improving the range of motion but do not use heat for the first two weeks following surgery (sometimes heat can increase post-operative swelling).   These exercises can be done on a training (exercise) mat, on the floor, on a table or on a bed. Use whatever works the best and is most comfortable for you.    Use music or television while you are exercising so that the exercises are a pleasant break in your day. This will make your life better with the exercises acting as a break in your routine that you can look forward to.   Perform all exercises about fifteen times, three times per day or as directed.  You should exercise both the operative leg and the other leg as well.  Exercises include:    Quad  Sets - Tighten up the muscle on the front of the thigh (Quad) and hold for 5-10 seconds.    Straight Leg Raises - With your knee straight (if you were given a brace, keep it on), lift the leg to 60 degrees, hold for 3 seconds, and slowly lower the leg.  Perform this exercise against resistance later as your leg gets stronger.   Leg Slides: Lying on your back, slowly slide your foot toward your buttocks, bending your knee up off the floor (only go as far as is comfortable). Then slowly slide your foot back down until your leg is flat on the floor again.   Angel Wings: Lying on your back spread your legs to the side as far apart as you can without causing discomfort.   Hamstring Strength:  Lying on your back, push your heel against the floor with your leg straight by tightening up the muscles of your buttocks.  Repeat, but this time bend your knee to a comfortable angle,  and push your heel against the floor.  You may put a pillow under the heel to make it more comfortable if necessary.   A rehabilitation program following joint replacement surgery can speed recovery and prevent re-injury in the future due to weakened muscles. Contact your doctor or a physical therapist for more information on knee rehabilitation.    CONSTIPATION  Constipation is defined medically as fewer than three stools per week and severe constipation as less than one stool per week.  Even if you have a regular bowel pattern at home, your normal regimen is likely to be disrupted due to multiple reasons following surgery.  Combination of anesthesia, postoperative narcotics, change in appetite and fluid intake all can affect your bowels.   YOU MUST use at least one of the following options; they are listed in order of increasing strength to get the job done.  They are all available over the counter, and you may need to use some, POSSIBLY even all of these options:    Drink plenty of fluids (prune juice may be helpful) and high  fiber foods Colace 100 mg by mouth twice a day  Senokot for constipation as directed and as needed Dulcolax (bisacodyl), take with full glass of water  Miralax (polyethylene glycol) once or twice a day as needed.  If you have tried all these things and are unable to have a bowel movement in the first 3-4 days after surgery call either your surgeon or your primary doctor.    If you experience loose stools or diarrhea, hold the medications until you stool forms back up.  If your symptoms do not get better within 1 week or if they get worse, check with your doctor.  If you experience "the worst abdominal pain ever" or develop nausea or vomiting, please contact the office immediately for further recommendations for treatment.   ITCHING:  If you experience itching with your medications, try taking only a single pain pill, or even half a pain pill at a time.  You can also use Benadryl over the counter for itching or also to help with sleep.   TED HOSE STOCKINGS:  Use stockings on both legs until for at least 2 weeks or as directed by physician office. They may be removed at night for sleeping.  MEDICATIONS:  See your medication summary on the After Visit Summary that nursing will review with you.  You may have some home medications which will be placed on hold until you complete the course of blood thinner medication.  It is important for you to complete the blood thinner medication as prescribed.  PRECAUTIONS:  If you experience chest pain or shortness of breath - call 911 immediately for transfer to the hospital emergency department.   If you develop a fever greater that 101 F, purulent drainage from wound, increased redness or drainage from wound, foul odor from the wound/dressing, or calf pain - CONTACT YOUR SURGEON.                                                   FOLLOW-UP APPOINTMENTS:  If you do not already have a post-op appointment, please call the office for an appointment to be seen by  your surgeon.  Guidelines for how soon to be seen are listed in your After Visit Summary, but are typically between 1-4  weeks after surgery.  OTHER INSTRUCTIONS:   Knee Replacement:  Do not place pillow under knee, focus on keeping the knee straight while resting. CPM instructions: 0-90 degrees, 2 hours in the morning, 2 hours in the afternoon, and 2 hours in the evening. Place foam block, curve side up under heel at all times except when in CPM or when walking.  DO NOT modify, tear, cut, or change the foam block in any way.  MAKE SURE YOU:   Understand these instructions.   Get help right away if you are not doing well or get worse.    Thank you for letting us be a part of your medical care team.  It is a privilege we respect greatly.  We hope these instructions will help you stay on track for a fast and full recovery!

## 2018-02-18 DIAGNOSIS — M797 Fibromyalgia: Secondary | ICD-10-CM | POA: Diagnosis not present

## 2018-02-18 DIAGNOSIS — Z95810 Presence of automatic (implantable) cardiac defibrillator: Secondary | ICD-10-CM | POA: Diagnosis not present

## 2018-02-18 DIAGNOSIS — I110 Hypertensive heart disease with heart failure: Secondary | ICD-10-CM | POA: Diagnosis not present

## 2018-02-18 DIAGNOSIS — I509 Heart failure, unspecified: Secondary | ICD-10-CM | POA: Diagnosis not present

## 2018-02-18 DIAGNOSIS — Z7901 Long term (current) use of anticoagulants: Secondary | ICD-10-CM | POA: Diagnosis not present

## 2018-02-18 DIAGNOSIS — Z96642 Presence of left artificial hip joint: Secondary | ICD-10-CM | POA: Diagnosis not present

## 2018-02-18 DIAGNOSIS — I255 Ischemic cardiomyopathy: Secondary | ICD-10-CM | POA: Diagnosis not present

## 2018-02-18 DIAGNOSIS — Z9181 History of falling: Secondary | ICD-10-CM | POA: Diagnosis not present

## 2018-02-18 DIAGNOSIS — I252 Old myocardial infarction: Secondary | ICD-10-CM | POA: Diagnosis not present

## 2018-02-18 DIAGNOSIS — M1611 Unilateral primary osteoarthritis, right hip: Secondary | ICD-10-CM | POA: Diagnosis not present

## 2018-02-18 DIAGNOSIS — G4730 Sleep apnea, unspecified: Secondary | ICD-10-CM | POA: Diagnosis not present

## 2018-02-18 DIAGNOSIS — Z7982 Long term (current) use of aspirin: Secondary | ICD-10-CM | POA: Diagnosis not present

## 2018-02-18 DIAGNOSIS — Z471 Aftercare following joint replacement surgery: Secondary | ICD-10-CM | POA: Diagnosis not present

## 2018-02-18 DIAGNOSIS — I2510 Atherosclerotic heart disease of native coronary artery without angina pectoris: Secondary | ICD-10-CM | POA: Diagnosis not present

## 2018-02-18 DIAGNOSIS — Z951 Presence of aortocoronary bypass graft: Secondary | ICD-10-CM | POA: Diagnosis not present

## 2018-02-19 ENCOUNTER — Telehealth (INDEPENDENT_AMBULATORY_CARE_PROVIDER_SITE_OTHER): Payer: Self-pay | Admitting: Orthopaedic Surgery

## 2018-02-19 NOTE — Telephone Encounter (Signed)
Verbal order given  

## 2018-02-19 NOTE — Telephone Encounter (Signed)
[facility]  ([name]       Verbal  3 times a week for 2 weeks   3 and 1 commode fax prescription to [phone] [facility]

## 2018-02-20 DIAGNOSIS — Z96642 Presence of left artificial hip joint: Secondary | ICD-10-CM | POA: Diagnosis not present

## 2018-02-20 DIAGNOSIS — M162 Bilateral osteoarthritis resulting from hip dysplasia: Secondary | ICD-10-CM | POA: Diagnosis not present

## 2018-02-28 ENCOUNTER — Ambulatory Visit (INDEPENDENT_AMBULATORY_CARE_PROVIDER_SITE_OTHER): Payer: PPO | Admitting: Orthopaedic Surgery

## 2018-02-28 ENCOUNTER — Encounter (INDEPENDENT_AMBULATORY_CARE_PROVIDER_SITE_OTHER): Payer: Self-pay | Admitting: Orthopaedic Surgery

## 2018-02-28 ENCOUNTER — Ambulatory Visit (INDEPENDENT_AMBULATORY_CARE_PROVIDER_SITE_OTHER): Payer: Self-pay

## 2018-02-28 DIAGNOSIS — Z96642 Presence of left artificial hip joint: Secondary | ICD-10-CM

## 2018-02-28 NOTE — Progress Notes (Signed)
The patient is 2 weeks status post a left total hip arthroplasty.  He is doing well.  He has been on Eliquis but that was something he was on prior to surgery as well.  He is ambulate with a walker and is supposed to transition to a cane tomorrow.  He is having problems sleeping at night.  He is off narcotics.  He is just taking Tylenol.  On exam his left hip incision looks good.  I placed new Steri-Strips.  He does have a hematoma but no seroma.  There is firmness to the tissue.  I recommend he try heating pad not directly on the skin release daily to twice daily for 30 to 45 minutes.  He will transition to a cane as well.  He can drive from my standpoint.  I offered to send in some Ambien for him but he will check with his cardiologist he states first.  We will see him back in 4 weeks to see how is doing overall but no x-rays are needed.  Of note he is feeling slightly catching his hip and we will watch this.  There is been no evidence of dislocation his leg lengths appear equal.

## 2018-03-28 ENCOUNTER — Encounter (INDEPENDENT_AMBULATORY_CARE_PROVIDER_SITE_OTHER): Payer: Self-pay | Admitting: Orthopaedic Surgery

## 2018-03-28 ENCOUNTER — Ambulatory Visit (INDEPENDENT_AMBULATORY_CARE_PROVIDER_SITE_OTHER): Payer: PPO | Admitting: Orthopaedic Surgery

## 2018-03-28 DIAGNOSIS — Z96642 Presence of left artificial hip joint: Secondary | ICD-10-CM

## 2018-03-28 NOTE — Progress Notes (Signed)
The patient is 6 weeks status post a left total hip arthroplasty.  He is 67 years old and is getting back to activity slowly.  He still ambulate with a cane.  He feels like he is gradually getting better.  On exam he tolerates me putting his left hip to internal extra rotation.  It is slightly stiff.  His leg lengths appear equal.  At this point we will continue increase his activities as comfort allows.  He is on Eliquis but this is per his cardiologist so they will address this medication with her cardiologist.  He can get back to the gym and get back to activities as comfort allows.  I would like to see him back in 6 months.  At that visit I would like standing low AP pelvis and lateral of his left operative hip.

## 2018-04-12 DIAGNOSIS — Z85828 Personal history of other malignant neoplasm of skin: Secondary | ICD-10-CM | POA: Diagnosis not present

## 2018-04-12 DIAGNOSIS — D485 Neoplasm of uncertain behavior of skin: Secondary | ICD-10-CM | POA: Diagnosis not present

## 2018-04-12 DIAGNOSIS — L570 Actinic keratosis: Secondary | ICD-10-CM | POA: Diagnosis not present

## 2018-04-12 DIAGNOSIS — D2372 Other benign neoplasm of skin of left lower limb, including hip: Secondary | ICD-10-CM | POA: Diagnosis not present

## 2018-05-08 DIAGNOSIS — Z4502 Encounter for adjustment and management of automatic implantable cardiac defibrillator: Secondary | ICD-10-CM | POA: Diagnosis not present

## 2018-05-08 DIAGNOSIS — I472 Ventricular tachycardia: Secondary | ICD-10-CM | POA: Diagnosis not present

## 2018-05-08 DIAGNOSIS — I255 Ischemic cardiomyopathy: Secondary | ICD-10-CM | POA: Diagnosis not present

## 2018-05-28 DIAGNOSIS — I255 Ischemic cardiomyopathy: Secondary | ICD-10-CM | POA: Diagnosis not present

## 2018-05-28 DIAGNOSIS — E782 Mixed hyperlipidemia: Secondary | ICD-10-CM | POA: Diagnosis not present

## 2018-05-28 DIAGNOSIS — I5022 Chronic systolic (congestive) heart failure: Secondary | ICD-10-CM | POA: Diagnosis not present

## 2018-07-06 DIAGNOSIS — Z95810 Presence of automatic (implantable) cardiac defibrillator: Secondary | ICD-10-CM | POA: Diagnosis not present

## 2018-07-06 DIAGNOSIS — I472 Ventricular tachycardia: Secondary | ICD-10-CM | POA: Diagnosis not present

## 2018-07-06 DIAGNOSIS — I255 Ischemic cardiomyopathy: Secondary | ICD-10-CM | POA: Diagnosis not present

## 2018-07-06 DIAGNOSIS — Z7982 Long term (current) use of aspirin: Secondary | ICD-10-CM | POA: Diagnosis not present

## 2018-07-06 DIAGNOSIS — Z4502 Encounter for adjustment and management of automatic implantable cardiac defibrillator: Secondary | ICD-10-CM | POA: Diagnosis not present

## 2018-07-11 ENCOUNTER — Encounter (INDEPENDENT_AMBULATORY_CARE_PROVIDER_SITE_OTHER): Payer: Self-pay | Admitting: Radiology

## 2018-07-11 ENCOUNTER — Telehealth (INDEPENDENT_AMBULATORY_CARE_PROVIDER_SITE_OTHER): Payer: Self-pay | Admitting: Orthopaedic Surgery

## 2018-07-11 NOTE — Telephone Encounter (Signed)
Erica from Dr. Ezzie Dural office ([facility]) called stating that the patient has an appointment but had surgery back in November of 2019.  She is needing a letter faxed to them stating that Dr. Ninfa Linden would not require this patient to have an antibiotic before seeing the dentist.  [phone] and [phone].  Thank you.

## 2018-07-11 NOTE — Telephone Encounter (Signed)
Confirmed with Brooke at Dr. Ezzie Dural office they received letter.

## 2018-07-11 NOTE — Telephone Encounter (Signed)
Faxed letter, letter also sent to patients MyChart as well.

## 2018-08-21 DIAGNOSIS — Z0000 Encounter for general adult medical examination without abnormal findings: Secondary | ICD-10-CM | POA: Diagnosis not present

## 2018-09-07 ENCOUNTER — Other Ambulatory Visit: Payer: Self-pay

## 2018-09-07 ENCOUNTER — Encounter: Payer: Self-pay | Admitting: Family Medicine

## 2018-09-07 ENCOUNTER — Ambulatory Visit (INDEPENDENT_AMBULATORY_CARE_PROVIDER_SITE_OTHER): Payer: PPO

## 2018-09-07 ENCOUNTER — Ambulatory Visit (INDEPENDENT_AMBULATORY_CARE_PROVIDER_SITE_OTHER): Payer: PPO | Admitting: Family Medicine

## 2018-09-07 DIAGNOSIS — M545 Low back pain, unspecified: Secondary | ICD-10-CM

## 2018-09-07 DIAGNOSIS — E785 Hyperlipidemia, unspecified: Secondary | ICD-10-CM | POA: Insufficient documentation

## 2018-09-07 MED ORDER — OXYCODONE HCL 5 MG PO TABS: 5.0000 mg | ORAL_TABLET | ORAL | 0 refills | Status: DC | PRN

## 2018-09-07 MED ORDER — PREDNISONE 10 MG PO TABS: ORAL_TABLET | ORAL | 0 refills | Status: DC

## 2018-09-07 NOTE — Progress Notes (Signed)
Office Visit Note   Patient: [name] Hall           Date of Birth: 12/05/1950           MRN: [id number] Visit Date: 09/07/2018 Requested by: Dineen Kid, MD [place], [place] [address] PCP: Dineen Kid, MD  Subjective: Chief Complaint  Patient presents with  . Lower Back - Pain    Pain in lower back x 5 days, after digging up stuff/doing yardwork. Episodes of not being able to straighten up after sitting on commode. Had 2 use a walker to walk, due to severe pain with moving legs.    HPI: He is here with low back pain.  About 5 days ago he was picking up something in his yard, felt some discomfort in his lower back but it was not severe right away.  The next day he could hardly stand up after sitting.  He has had progressively worsening pain with some radiation around to the front of his abdomen.  He is not having any sciatica symptoms.  He has a history of lumbar fusion at one level surgically, and another level naturally.  He has been chronically disabled due to his back problems.  This pain is different than what he has had in the past.  Denies any history of osteoporosis.  Denies any hip pain, he status post left hip replacement and is doing well from that standpoint.              ROS: Denies fevers, chills, respiratory symptoms.  All other systems were reviewed and are negative.  Objective: Vital Signs: There were no vitals taken for this visit.  Physical Exam:  General:  Alert and oriented, in no acute distress. Pulm:  Breathing unlabored. Psy:  Normal mood, congruent affect. Skin: No rash on his skin. Low back: No tenderness to palpation of the lumbar spinous processes.  Well-healed surgical scar at around L4-5.  Paraspinous muscles are slightly tight but not seeming significantly tender.  Pain is not reproducible by palpation today.  Negative bilateral straight leg raise, no pain in the sciatic notch.  No tenderness over the SI joints.  Lower extremity strength  and reflexes are normal.  Imaging: X-rays lumbar spine: He has a stable L4-5 surgical fusion with no sign of loosening of the hardware..  Are bridging osteophytes L2-3 and L3-4 anteriorly.  Autofusion present at L5-S1.  On the AP view at L1-2 on the right, there is a large bridged osteophyte.  I cannot see a definite compression deformity.    Assessment & Plan: 1.  Acute on chronic low back pain, probably muscular strain but cannot rule out upper lumbar disc protrusion. -He will try some back extension exercises at home, he has an inversion table at home that he wants to use as well.  Prednisone taper, oxycodone as needed.  Could contemplate a one-time epidural injection if symptoms do not improve quickly.  He would probably need to stop Eliquis but would need to coordinate this with his cardiologist.     Procedures: No procedures performed  No notes on file     PMFS History: Patient Active Problem List   Diagnosis Date Noted  . Hyperlipidemia, unspecified 09/07/2018  . 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  . Unilateral primary osteoarthritis, left hip 12/18/2017  . Unilateral primary osteoarthritis, right hip 12/18/2017  . Fitting or adjustment of automatic implantable cardioverter-defibrillator 06/07/2016  . Hyperkalemia 04/16/2014  . ICD (implantable  cardioverter-defibrillator), single, in situ 03/14/2011  . Coronary artery disease 03/07/2011  . Essential hypertension 03/07/2011  . Fibromyalgia 03/07/2011  . Ischemic cardiomyopathy 03/07/2011  . Left ventricular apical thrombus 03/07/2011  . Low testosterone 03/07/2011  . Obstructive sleep apnea on CPAP 03/07/2011   Past Medical History:  Diagnosis Date  . CAD (coronary artery disease)   . CHF (congestive heart failure) ([place])   . Complication of anesthesia    slow to wake after a surgery years ago ; but no issues with subsequent surgeries   . Fibromyalgia   . HLD (hyperlipidemia)   . Hx of CABG   .  Hypertension    denies   . Ischemic cardiomyopathy   . 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 Myocardial infarction ([place])    2012    . Sleep apnea    no CPAP in use     History reviewed. No pertinent family history.  Past Surgical History:  Procedure Laterality Date  . CARDIAC CATHETERIZATION  2012   X2 with 2 stents   . CORONARY ARTERY BYPASS GRAFT  2007   quadruple   . ICD IMPLANT  2012  . partial discectomy   1995  . SPINAL FUSION  2003   lumbar   . TOTAL HIP ARTHROPLASTY Left 02/15/2018   Procedure: LEFT TOTAL HIP ARTHROPLASTY ANTERIOR APPROACH;  Surgeon: Mcarthur Rossetti, MD;  Location: [facility];  Service: Orthopedics;  Laterality: Left;   Social History   Occupational History  . Not on file  Tobacco Use  . Smoking status: Never Smoker  . Smokeless tobacco: Never Used  Substance and Sexual Activity  . Alcohol use: Yes    Comment: occ  . Drug use: Not on file  . Sexual activity: Not on file

## 2018-09-17 ENCOUNTER — Ambulatory Visit: Payer: PPO | Admitting: Orthopaedic Surgery

## 2018-09-27 ENCOUNTER — Ambulatory Visit: Payer: PPO | Admitting: Orthopaedic Surgery

## 2018-09-27 ENCOUNTER — Ambulatory Visit: Payer: Self-pay | Admitting: Orthopaedic Surgery

## 2018-10-03 ENCOUNTER — Encounter: Payer: Self-pay | Admitting: Orthopaedic Surgery

## 2018-10-03 ENCOUNTER — Ambulatory Visit (INDEPENDENT_AMBULATORY_CARE_PROVIDER_SITE_OTHER): Payer: PPO

## 2018-10-03 ENCOUNTER — Other Ambulatory Visit: Payer: Self-pay

## 2018-10-03 ENCOUNTER — Ambulatory Visit (INDEPENDENT_AMBULATORY_CARE_PROVIDER_SITE_OTHER): Payer: PPO | Admitting: Orthopaedic Surgery

## 2018-10-03 VITALS — Ht 70.0 in | Wt 190.0 lb

## 2018-10-03 DIAGNOSIS — Z96642 Presence of left artificial hip joint: Secondary | ICD-10-CM

## 2018-10-03 NOTE — Progress Notes (Signed)
The patient is now 8 months status post a left total hip arthroplasty through direct anterior approach.  He has severe debilitating arthritis in that left hip prior to surgery.  He says he is done well overall and is very pleased.  He has known arthritis which is quite severe and significant in his right hip but it does not bother him clinically.  On exam I can easily move his left hip around through internal X rotation with no pain at all.  His right hip is just a little bit stiff especially with rotation but there is no significant pain.  X-rays of the pelvis and left hip shows severe end-stage arthritis of the right hip with peritracheal osteophytes and sclerotic changes as well as severe joint space narrowing.  The left total hip arthroplasty appears well seated in bone ingrown with no evidence of loosening.  This point is far as his hip schedule follow-up as needed.  He understands that we can proceed with his right hip when it starts bothering him enough given the severity of his arthritis some x-ray as well as what is feeling on clinical exam.  He is still dealing with some lumbar spine issues for which he saw Dr. Junius Roads for.  If he continues to have any worsening spine problems he will let us know.  Apparently a steroid taper did help quite a bit.

## 2018-11-19 DIAGNOSIS — I472 Ventricular tachycardia: Secondary | ICD-10-CM | POA: Diagnosis not present

## 2018-11-19 DIAGNOSIS — I2129 ST elevation (STEMI) myocardial infarction involving other sites: Secondary | ICD-10-CM | POA: Diagnosis not present

## 2018-11-19 DIAGNOSIS — I5022 Chronic systolic (congestive) heart failure: Secondary | ICD-10-CM | POA: Diagnosis not present

## 2019-02-05 DIAGNOSIS — Z4502 Encounter for adjustment and management of automatic implantable cardiac defibrillator: Secondary | ICD-10-CM | POA: Diagnosis not present

## 2019-02-05 DIAGNOSIS — I255 Ischemic cardiomyopathy: Secondary | ICD-10-CM | POA: Diagnosis not present

## 2019-02-22 DIAGNOSIS — Z79899 Other long term (current) drug therapy: Secondary | ICD-10-CM | POA: Diagnosis not present

## 2019-02-22 DIAGNOSIS — E782 Mixed hyperlipidemia: Secondary | ICD-10-CM | POA: Diagnosis not present

## 2019-02-22 DIAGNOSIS — N400 Enlarged prostate without lower urinary tract symptoms: Secondary | ICD-10-CM | POA: Diagnosis not present

## 2019-04-09 DIAGNOSIS — L570 Actinic keratosis: Secondary | ICD-10-CM | POA: Diagnosis not present

## 2019-04-09 DIAGNOSIS — D485 Neoplasm of uncertain behavior of skin: Secondary | ICD-10-CM | POA: Diagnosis not present

## 2019-04-09 DIAGNOSIS — L821 Other seborrheic keratosis: Secondary | ICD-10-CM | POA: Diagnosis not present

## 2019-04-09 DIAGNOSIS — D225 Melanocytic nevi of trunk: Secondary | ICD-10-CM | POA: Diagnosis not present

## 2019-04-09 DIAGNOSIS — D2372 Other benign neoplasm of skin of left lower limb, including hip: Secondary | ICD-10-CM | POA: Diagnosis not present

## 2019-04-09 DIAGNOSIS — Z85828 Personal history of other malignant neoplasm of skin: Secondary | ICD-10-CM | POA: Diagnosis not present

## 2019-05-07 DIAGNOSIS — I255 Ischemic cardiomyopathy: Secondary | ICD-10-CM | POA: Diagnosis not present

## 2019-05-07 DIAGNOSIS — Z4502 Encounter for adjustment and management of automatic implantable cardiac defibrillator: Secondary | ICD-10-CM | POA: Diagnosis not present

## 2019-05-21 ENCOUNTER — Ambulatory Visit: Payer: PPO | Attending: Internal Medicine

## 2019-05-21 DIAGNOSIS — Z23 Encounter for immunization: Secondary | ICD-10-CM

## 2019-05-21 NOTE — Progress Notes (Signed)
   Covid-19 Vaccination Clinic  Name:  [name] Hall    MRN: [id number] DOB: 11/28/1950  05/21/2019  [name] was observed post Covid-19 immunization for 15 minutes without incidence. He was provided with Vaccine Information Sheet and instruction to access the V-Safe system.   [name] was instructed to call 911 with any severe reactions post vaccine: Marland Kitchen Difficulty breathing  . Swelling of your face and throat  . A fast heartbeat  . A bad rash all over your body  . Dizziness and weakness    Immunizations Administered    Name Date Dose VIS Date Route   Pfizer COVID-19 Vaccine 05/21/2019  1:55 PM 0.3 mL 04/12/2019 Intramuscular   Manufacturer: [place]   Lot: [id number]   [place]: [id number]

## 2019-05-27 DIAGNOSIS — I472 Ventricular tachycardia: Secondary | ICD-10-CM | POA: Diagnosis not present

## 2019-05-27 DIAGNOSIS — I5022 Chronic systolic (congestive) heart failure: Secondary | ICD-10-CM | POA: Diagnosis not present

## 2019-05-27 DIAGNOSIS — I255 Ischemic cardiomyopathy: Secondary | ICD-10-CM | POA: Diagnosis not present

## 2019-06-11 ENCOUNTER — Ambulatory Visit: Payer: PPO | Attending: Internal Medicine

## 2019-06-11 ENCOUNTER — Ambulatory Visit: Payer: PPO

## 2019-06-11 DIAGNOSIS — Z23 Encounter for immunization: Secondary | ICD-10-CM

## 2019-06-11 NOTE — Progress Notes (Signed)
   Covid-19 Vaccination Clinic  Name:  [name] Hall    MRN: [id number] DOB: January 21, 1951  06/11/2019  Mr. [name] Hall was observed post Covid-19 immunization for 15 minutes without incidence. He was provided with Vaccine Information Sheet and instruction to access the V-Safe system.   Mr. [name] Hall was instructed to call 911 with any severe reactions post vaccine: Marland Kitchen Difficulty breathing  . Swelling of your face and throat  . A fast heartbeat  . A bad rash all over your body  . Dizziness and weakness    Immunizations Administered    Name Date Dose VIS Date Route   Pfizer COVID-19 Vaccine 06/11/2019 12:56 PM 0.3 mL 04/12/2019 Intramuscular   Manufacturer: [place]   Lot: [id number]   [place]: [id number]

## 2019-08-05 DIAGNOSIS — I255 Ischemic cardiomyopathy: Secondary | ICD-10-CM | POA: Diagnosis not present

## 2019-08-08 DIAGNOSIS — Z95810 Presence of automatic (implantable) cardiac defibrillator: Secondary | ICD-10-CM | POA: Diagnosis not present

## 2019-09-02 ENCOUNTER — Ambulatory Visit: Payer: PPO | Admitting: Orthopaedic Surgery

## 2019-09-02 ENCOUNTER — Ambulatory Visit (INDEPENDENT_AMBULATORY_CARE_PROVIDER_SITE_OTHER): Payer: PPO

## 2019-09-02 ENCOUNTER — Other Ambulatory Visit: Payer: Self-pay

## 2019-09-02 ENCOUNTER — Encounter: Payer: Self-pay | Admitting: Orthopaedic Surgery

## 2019-09-02 DIAGNOSIS — S8262XA Displaced fracture of lateral malleolus of left fibula, initial encounter for closed fracture: Secondary | ICD-10-CM

## 2019-09-02 DIAGNOSIS — M25572 Pain in left ankle and joints of left foot: Secondary | ICD-10-CM | POA: Diagnosis not present

## 2019-09-02 NOTE — Progress Notes (Signed)
Office Visit Note   Patient: [name] Hall           Date of Birth: [birth year]           MRN: [id number] Visit Date: 09/02/2019              Requested by: Dineen Kid, MD [place],  [place] [address] PCP: Dineen Kid, MD   Assessment & Plan: Visit Diagnoses:  1. Pain in left ankle and joints of left foot   2. Closed displaced fracture of lateral malleolus of left fibula, initial encounter     Plan: Radiographically this does appear to be a stable ankle fracture with an intact ankle mortise.  I will place him in a cam walking boot with limited weightbearing.  He will work on continued elevation and icing intermittently.  I would like to see him back in just 1 week with a repeat 3 views of the left ankle.  He understands we are trying to treat this nonoperative and conservative but if the fracture displaces more or the x-ray shows an unstable ankle mortise we may end up needing to proceed with surgery.  Hopefully that is not the case.  All questions and concerns were answered and addressed.  Again in 1 week we will repeat 3 views left ankle.  Follow-Up Instructions: Return in about 1 week (around 09/09/2019).   Orders:  Orders Placed This Encounter  Procedures  . XR Ankle Complete Left   No orders of the defined types were placed in this encounter.     Procedures: No procedures performed   Clinical Data: No additional findings.   Subjective: Chief Complaint  Patient presents with  . Left Ankle - Pain  Patient is a very pleasant 69 year old gentleman who have seen before.  He comes in with acute left ankle injury.  He feels like he may have sprained this ankle when he was mowing the grass on Saturday.  He stepped in a hole wrong and twisted his ankle.  He has been nonweightbearing to just touchdown weightbearing on his left ankle and is worked on elevation icing since then.  He does report mainly lateral pain.  He has never had surgery on this ankle before and  never injured it before.  He is very active.  He denies any acute changes in medical status.  HPI  Review of Systems He currently denies any headache, chest pain, shortness of breath, fever, chills, nausea, vomiting  Objective: Vital Signs: There were no vitals taken for this visit.  Physical Exam He is alert and oriented x3 and in no acute distress Ortho Exam Examination of the left ankle shows significant bruising medially and laterally.  There is no pain over the medial malleolus but there is significant pain over the lateral malleolus.  His foot is well-perfused and otherwise well located.  He is neurovascularly intact. Specialty Comments:  No specialty comments available.  Imaging: XR Ankle Complete Left  Result Date: 09/02/2019 3 views of the left ankle show an acute lateral malleolus fracture.  There is slight displacement and shortening of the fracture.  The ankle mortise is well aligned and located.  There is a small avulsion at the tip of the medial malleolus.    PMFS History: Patient Active Problem List   Diagnosis Date Noted  . Hyperlipidemia, unspecified 09/07/2018  . 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  . Unilateral primary osteoarthritis, left hip 12/18/2017  . Unilateral primary osteoarthritis, right  hip 12/18/2017  . Fitting or adjustment of automatic implantable cardioverter-defibrillator 06/07/2016  . Hyperkalemia 04/16/2014  . ICD (implantable cardioverter-defibrillator), single, in situ 03/14/2011  . Coronary artery disease 03/07/2011  . Essential hypertension 03/07/2011  . Fibromyalgia 03/07/2011  . Ischemic cardiomyopathy 03/07/2011  . Left ventricular apical thrombus 03/07/2011  . Low testosterone 03/07/2011  . Obstructive sleep apnea on CPAP 03/07/2011   Past Medical History:  Diagnosis Date  . CAD (coronary artery disease)   . CHF (congestive heart failure) ([place])   . Complication of anesthesia    slow to wake after a surgery  years ago ; but no issues with subsequent surgeries   . Fibromyalgia   . HLD (hyperlipidemia)   . Hx of CABG   . Hypertension    denies   . Ischemic cardiomyopathy   . 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[place])   . Myocardial infarction ([place])    2012    . Sleep apnea    no CPAP in use     History reviewed. No pertinent family history.  Past Surgical History:  Procedure Laterality Date  . CARDIAC CATHETERIZATION  2012   X2 with 2 stents   . CORONARY ARTERY BYPASS GRAFT  2007   quadruple   . ICD IMPLANT  2012  . partial discectomy   1995  . SPINAL FUSION  2003   lumbar   . TOTAL HIP ARTHROPLASTY Left 02/15/2018   Procedure: LEFT TOTAL HIP ARTHROPLASTY ANTERIOR APPROACH;  Surgeon: Mcarthur Rossetti, MD;  Location: [facility];  Service: Orthopedics;  Laterality: Left;   Social History   Occupational History  . Not on file  Tobacco Use  . Smoking status: Never Smoker  . Smokeless tobacco: Never Used  Substance and Sexual Activity  . Alcohol use: Yes    Comment: occ  . Drug use: Not on file  . Sexual activity: Not on file

## 2019-09-05 ENCOUNTER — Encounter: Payer: Self-pay | Admitting: Orthopaedic Surgery

## 2019-09-09 ENCOUNTER — Encounter: Payer: Self-pay | Admitting: Orthopaedic Surgery

## 2019-09-09 ENCOUNTER — Ambulatory Visit (INDEPENDENT_AMBULATORY_CARE_PROVIDER_SITE_OTHER): Payer: PPO

## 2019-09-09 ENCOUNTER — Other Ambulatory Visit: Payer: Self-pay

## 2019-09-09 ENCOUNTER — Ambulatory Visit: Payer: PPO | Admitting: Orthopaedic Surgery

## 2019-09-09 DIAGNOSIS — S8262XA Displaced fracture of lateral malleolus of left fibula, initial encounter for closed fracture: Secondary | ICD-10-CM

## 2019-09-09 NOTE — Progress Notes (Signed)
The patient is now 9 days status post a significant left ankle injury.  He is 69 years old.  He sustained a lateral malleolus fracture and a small avulsion off of the tip of the medial malleolus.  I found him a cam walking boot due to swelling.  He is put minimal weight on his left ankle.  On exam there is significant bruising about his left ankle.  Clinically it is well located.  There is moderate swelling as well.  He is neurovascular intact.  Neupro 3 views of the left ankle are obtained and fortunately show the ankle mortise is congruent and intact.  There is displacement of the small medial malleolus piece and the lateral malleolus piece but is not affecting the congruency of the joint.  We are trying to treat this nonoperative.  I am encouraged by the fact that he has been compliant with his weightbearing status in the boot and the alignment is overall acceptable.  I would like to have him continue the boot at all times with coming out of it for hygiene purposes and limit his weightbearing as he has been doing.  I would like to see him back in just 2 weeks with a repeat 3 views of the left ankle.  All questions and concerns were answered and addressed.

## 2019-09-23 ENCOUNTER — Ambulatory Visit: Payer: PPO | Admitting: Orthopaedic Surgery

## 2019-09-23 ENCOUNTER — Encounter: Payer: Self-pay | Admitting: Orthopaedic Surgery

## 2019-09-23 ENCOUNTER — Ambulatory Visit (INDEPENDENT_AMBULATORY_CARE_PROVIDER_SITE_OTHER): Payer: PPO

## 2019-09-23 ENCOUNTER — Other Ambulatory Visit: Payer: Self-pay

## 2019-09-23 DIAGNOSIS — S8262XD Displaced fracture of lateral malleolus of left fibula, subsequent encounter for closed fracture with routine healing: Secondary | ICD-10-CM

## 2019-09-23 DIAGNOSIS — S8262XA Displaced fracture of lateral malleolus of left fibula, initial encounter for closed fracture: Secondary | ICD-10-CM

## 2019-09-23 NOTE — Progress Notes (Signed)
The patient is now just over 3 weeks status post a significant left ankle fracture.  We are treating this nonoperative.  He has a fracture of the lateral malleolus at the level ankle mortise.  There is only a small chip fracture off the medial malleolus that this is a bimalleolar equivalent.  We have treated this with nonweightbearing in a cam walking boot.  He is frustrated about the time it takes to heal something but overall he is doing well.  He is not a smoker and not a diabetic.  On exam he has significant bruising on his foot and ankle.  I can tell he has been elevating quite a bit because the swelling is down significantly.  There is some stiffness throughout the arc of motion of the ankle.  It is well located.  3 views of the left ankle are obtained and reviewed the previous x-rays.  The ankle mortise is intact and the fracture is not displaced anymore and appears stable.  At this point we are going to continue with this nonoperative conservative treatment route.  He can try weightbearing as tolerated in cam walking boot.  We would like to see him back in 3 weeks with a repeat 3 views of the left ankle.  At that point we will transition him to an ASO.

## 2019-10-21 ENCOUNTER — Ambulatory Visit: Payer: PPO | Admitting: Orthopaedic Surgery

## 2019-10-21 ENCOUNTER — Encounter: Payer: Self-pay | Admitting: Orthopaedic Surgery

## 2019-10-21 ENCOUNTER — Ambulatory Visit (INDEPENDENT_AMBULATORY_CARE_PROVIDER_SITE_OTHER): Payer: PPO

## 2019-10-21 ENCOUNTER — Other Ambulatory Visit: Payer: Self-pay

## 2019-10-21 DIAGNOSIS — S8262XD Displaced fracture of lateral malleolus of left fibula, subsequent encounter for closed fracture with routine healing: Secondary | ICD-10-CM

## 2019-10-21 NOTE — Progress Notes (Signed)
The patient is now about 7 weeks status post a left ankle fracture which she sustained a displaced lateral malleolus fracture.  We have been treating this with cam walking boot.  He had a small avulsion off the medial malleolus.  He has been comfortable in the walking boot.  On exam his ankle still slightly swollen.  His range of motion is almost entirely full.  He has appropriate pain over the lateral malleolus but no medial pain.  His left ankle is ligamentously stable.  3 views left ankle show interval healing of the ankle fracture on the left side.  The ankle mortise is congruent.  He will transition to an ASO at this standpoint with weightbearing as tolerated.  I would like to see him back in 4 weeks with a repeat 3 views of his left ankle.  All questions and concerns were answered and addressed.

## 2019-11-18 ENCOUNTER — Other Ambulatory Visit: Payer: Self-pay

## 2019-11-18 ENCOUNTER — Ambulatory Visit (INDEPENDENT_AMBULATORY_CARE_PROVIDER_SITE_OTHER): Payer: PPO

## 2019-11-18 ENCOUNTER — Encounter: Payer: Self-pay | Admitting: Orthopaedic Surgery

## 2019-11-18 ENCOUNTER — Ambulatory Visit: Payer: PPO | Admitting: Orthopaedic Surgery

## 2019-11-18 VITALS — Ht 70.0 in | Wt 193.0 lb

## 2019-11-18 DIAGNOSIS — S8262XD Displaced fracture of lateral malleolus of left fibula, subsequent encounter for closed fracture with routine healing: Secondary | ICD-10-CM

## 2019-11-18 NOTE — Progress Notes (Signed)
The patient is a very active 69 year old gentleman he does like to play golf.  He is now 10 weeks to 11 weeks into an injury sustaining fractures of the medial lateral malleolus without any significant displacement.  I treated him first with a walking boot and now he has an ASO.  He says he still having a difficult time with prolonged walking and he wishes he was along better than he is currently.  He does get some soreness on the top of his foot and ankle as well.  He does take ibuprofen at night which helps him sleep.  On examination of his left ankle there is still some swelling to be expected but it is not severe.  He has really no pain palpating over the lateral malleolus and some medial pain.  His ankle is intact ligamentously with excellent range of motion.  3 views left ankle obtained show that the ankle mortise is congruent and the fracture healing significantly at this standpoint.  I did show him stretching exercises for his Achilles and gave him a Thera-Band and showed him some calf strengthening exercises to try as well.  He can wear the ASO for 2 more weeks and then can stop the ASO.  I would like to see him back for final visit in about 6 weeks with a repeat 3 views of his left ankle.  All questions and concerns were answered and addressed.

## 2019-11-20 DIAGNOSIS — Z95810 Presence of automatic (implantable) cardiac defibrillator: Secondary | ICD-10-CM | POA: Diagnosis not present

## 2019-11-20 DIAGNOSIS — I255 Ischemic cardiomyopathy: Secondary | ICD-10-CM | POA: Diagnosis not present

## 2019-11-21 DIAGNOSIS — Z95810 Presence of automatic (implantable) cardiac defibrillator: Secondary | ICD-10-CM | POA: Diagnosis not present

## 2019-11-21 DIAGNOSIS — I255 Ischemic cardiomyopathy: Secondary | ICD-10-CM | POA: Diagnosis not present

## 2019-12-09 DIAGNOSIS — I255 Ischemic cardiomyopathy: Secondary | ICD-10-CM | POA: Diagnosis not present

## 2019-12-30 ENCOUNTER — Ambulatory Visit (INDEPENDENT_AMBULATORY_CARE_PROVIDER_SITE_OTHER): Payer: PPO

## 2019-12-30 ENCOUNTER — Encounter: Payer: Self-pay | Admitting: Orthopaedic Surgery

## 2019-12-30 ENCOUNTER — Ambulatory Visit: Payer: PPO | Admitting: Orthopaedic Surgery

## 2019-12-30 DIAGNOSIS — S8262XD Displaced fracture of lateral malleolus of left fibula, subsequent encounter for closed fracture with routine healing: Secondary | ICD-10-CM

## 2019-12-30 NOTE — Progress Notes (Signed)
The patient is now over 3 months status post injury to his right ankle which she sustained a bimalleolar equivalent ankle fracture.  We treated this nonoperatively.  He is a very active 69 years old gentleman.  He still reports ankle pain and swelling and he is not where he wants to be from his standpoint but from my standpoint I think he has been doing well.  He is walking without any type of brace he is back to normal tennis shoes.  On examination of his left ankle, there is some swelling but his range of motion is almost entirely full of the standpoint he actually has good strength with plantarflexion and dorsiflexion as well as inversion and eversion.  New per 3 views of the left ankle show that the ankle fracture is healed and the ankle mortise is still congruent and intact.  I gave him reassurance that he can start playing golf again but he should try his ASO at first.  I gave him strengthening exercises to try for the ankle as well.  I will see him back in 3 months for final 3 views of the left ankle.  I did offer outpatient physical therapy as well but he is going to try home exercise program.

## 2020-03-30 ENCOUNTER — Ambulatory Visit: Payer: PPO | Admitting: Orthopaedic Surgery

## 2020-04-02 ENCOUNTER — Ambulatory Visit: Payer: PPO | Admitting: Orthopaedic Surgery

## 2020-04-09 ENCOUNTER — Encounter: Payer: Self-pay | Admitting: Physician Assistant

## 2020-04-09 ENCOUNTER — Ambulatory Visit (INDEPENDENT_AMBULATORY_CARE_PROVIDER_SITE_OTHER): Payer: PPO

## 2020-04-09 ENCOUNTER — Ambulatory Visit: Payer: PPO | Admitting: Physician Assistant

## 2020-04-09 DIAGNOSIS — S8262XD Displaced fracture of lateral malleolus of left fibula, subsequent encounter for closed fracture with routine healing: Secondary | ICD-10-CM

## 2020-04-09 NOTE — Progress Notes (Signed)
HPI: Mr. Cherian returns today follow-up of his left ankle bimalleolar fracture which has been treated conservatively.  Again the ankle fracture occurred on 08/31/2019.  He states that the ankle is stiff and hurts some.  He is not taking anything for pain.  He does feel that the ankle could be a little weak but denies any giving way painful popping or locking.  He is not wearing ASO brace he did not find it is beneficial.  Physical exam: Left ankle he has no tenderness with palpation along medial and lateral malleoli.  No tenderness along the Achilles.  There is no abnormal warmth erythema or fusion.  Dorsal pedal pulses intact.  Does lack a few degrees of dorsiflexion of the left ankle compared to the right.  About a 5 strength with inversion eversion against resistance left foot.  Nontender over the posterior tibial tendon peroneal tendon.  Radiographs: 3 views left ankle show the talus to be well located within the ankle mortise.  The fractures are well healed.  No other bony abnormalities.  Impression: Status post left ankle bimalleolar fracture 08/31/2019  Plan: Recommend continued physical therapy to work on range of motion, strengthening home exercise program proprioception and modalities.  He will follow up with [place] on as-needed basis.  Questions encouraged and answered

## 2020-04-10 NOTE — Addendum Note (Signed)
Addended by: Robyne Peers on: 04/10/2020 08:20 AM   Modules accepted: Orders

## 2020-04-21 ENCOUNTER — Encounter: Payer: Self-pay | Admitting: Physical Therapy

## 2020-04-21 ENCOUNTER — Ambulatory Visit: Payer: PPO | Admitting: Physical Therapy

## 2020-04-21 ENCOUNTER — Other Ambulatory Visit: Payer: Self-pay

## 2020-04-21 DIAGNOSIS — M25672 Stiffness of left ankle, not elsewhere classified: Secondary | ICD-10-CM

## 2020-04-21 DIAGNOSIS — M6281 Muscle weakness (generalized): Secondary | ICD-10-CM | POA: Diagnosis not present

## 2020-04-21 DIAGNOSIS — R2689 Other abnormalities of gait and mobility: Secondary | ICD-10-CM

## 2020-04-21 DIAGNOSIS — R600 Localized edema: Secondary | ICD-10-CM

## 2020-04-21 DIAGNOSIS — R2681 Unsteadiness on feet: Secondary | ICD-10-CM

## 2020-04-21 DIAGNOSIS — M25572 Pain in left ankle and joints of left foot: Secondary | ICD-10-CM

## 2020-04-21 NOTE — Therapy (Signed)
[facility] Physical Therapy [address] [place], [place], [address] Phone: [phone]   Fax:  [phone]  Physical Therapy Evaluation  Patient Details  Name: [name] Hall MRN: [id number] Date of Birth: 1951/03/30 Referring Provider (PT): Erskine Emery, [place]   Encounter Date: 04/21/2020   PT End of Session - 04/21/20 1608    Visit Number 1    Number of Visits 5    Date for PT Re-Evaluation 05/27/19    Authorization Type [employer]    Authorization Time Period $15 copay    PT Start Time 1300    PT Stop Time 1345    PT Time Calculation (min) 45 min    Activity Tolerance Patient tolerated treatment well    Behavior During Therapy [facility] for tasks assessed/performed           Past Medical History:  Diagnosis Date  . CAD (coronary artery disease)   . CHF (congestive heart failure) ([place])   . Complication of anesthesia    slow to wake after a surgery years ago ; but no issues with subsequent surgeries   . Fibromyalgia   . HLD (hyperlipidemia)   . Hx of CABG   . Hypertension    denies   . Ischemic cardiomyopathy   . 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 Myocardial infarction ([place])    2012    . Sleep apnea    no CPAP in use     Past Surgical History:  Procedure Laterality Date  . CARDIAC CATHETERIZATION  2012   X2 with 2 stents   . CORONARY ARTERY BYPASS GRAFT  2007   quadruple   . ICD IMPLANT  2012  . partial discectomy   1995  . SPINAL FUSION  2003   lumbar   . TOTAL HIP ARTHROPLASTY Left 02/15/2018   Procedure: LEFT TOTAL HIP ARTHROPLASTY ANTERIOR APPROACH;  Surgeon: Mcarthur Rossetti, MD;  Location: [facility];  Service: Orthopedics;  Laterality: Left;    There were no vitals filed for this visit.    Subjective Assessment - 04/21/20 1305    Subjective This 69yo male was referred by Erskine Emery, PA for left closed displaced lateral malleolus fracture (S82.62XD) which occured 08/31/2019. He was NWB for 6-8  weeks.  He has ongoing pain & limited mobility. He reports some exercises with bike.    Pertinent History Left THA 02/15/18, CAD, cardiomyopathy, implanted cardioverter-defibrillator,  Left ventricular apical thrombus, HTN, Fibromyalgia, Back surgery 2002 with RLE weakness    Patient Stated Goals to decrease ankle pain with activities & get mobility back.    Currently in Pain? Yes    Pain Score 2    in last week, worst 5-6/10, best 0/10   Pain Location Ankle    Pain Orientation Left    Pain Descriptors / Indicators Sharp;Aching;Discomfort    Pain Type Chronic pain    Pain Onset More than a month ago    Pain Frequency Intermittent    Aggravating Factors  walking or standing on it,    Pain Relieving Factors sit to rest,              [facility] PT Assessment - 04/21/20 1300      Assessment   Medical Diagnosis Left ankle fracture    Referring Provider (PT) Erskine Emery, PA    Onset Date/Surgical Date 08/31/19    Prior Therapy none for ankle injury      Precautions   Precautions Fall;ICD/Pacemaker  Balance Screen   Has the patient fallen in the past 6 months No    Has the patient had a decrease in activity level because of a fear of falling?  No    Is the patient reluctant to leave their home because of a fear of falling?  No      Home Environment   Living Environment Private residence    Living Arrangements Spouse/significant other    Type of [place] to enter    Entrance Stairs-Number of Steps 3    Entrance Stairs-Rails None    Home Layout One level      Prior Function   Level of Independence Independent;Independent with household mobility without device;Independent with community mobility without device    Vocation Retired    Leisure play golf, walk, hike      Observation/Other Assessments-Edema    Edema Figure 8      Figure 8 Edema   Figure 8 - Right  52.6cm    Figure 8 - Left  53.8 cm      Functional Tests   Functional tests Single leg  stance      Single Leg Stance   Comments left 3 sec on 1st attempt & 25 sec on 2nd attempt      ROM / Strength   AROM / PROM / Strength AROM;PROM;Strength      AROM   Overall AROM  Deficits    AROM Assessment Site Ankle    Right/Left Ankle Left    Left Ankle Dorsiflexion -5    Left Ankle Plantar Flexion 48    Left Ankle Inversion 7    Left Ankle Eversion -1      PROM   Overall PROM  Deficits    PROM Assessment Site Ankle    Right/Left Ankle Left    Left Ankle Dorsiflexion -3    Left Ankle Plantar Flexion 50    Left Ankle Inversion 10    Left Ankle Eversion 1      Strength   Overall Strength Deficits    Strength Assessment Site Ankle    Right/Left Ankle Left    Right Ankle Dorsiflexion 4/5    Right Ankle Plantar Flexion 2/5   hx of nerve damage from back surgery   Left Ankle Dorsiflexion 4/5    Left Ankle Plantar Flexion 4/5   standing   Left Ankle Inversion 3/5    Left Ankle Eversion 2-/5      Ambulation/Gait   Ambulation/Gait Yes    Ambulation/Gait Assistance 7: Independent    Ambulation/Gait Assistance Details Pt reports difficulty with distances >1/4 mile or walking on inclines especially grass or uneven terrain    Assistive device None    Gait Pattern Step-through pattern;Decreased step length - right;Antalgic    Ambulation Surface Level;Indoor                      Objective measurements completed on examination: See above findings.               PT Education - 04/21/20 1341    Education Details Access Code: [email or web address]    Person(s) Educated Patient    Methods Explanation;Demonstration;Tactile cues;Verbal cues;Handout    Comprehension Verbalized understanding;Returned demonstration               PT Long Term Goals - 04/21/20 1621      PT LONG TERM GOAL #1   Title Patient verbalizes & demonstrates  understanding of progressive HEP / fitness plan.    Time 4    Period Weeks    Status New    Target Date 05/26/20      PT LONG  TERM GOAL #2   Title Patient reports pain increases </= 2 increments on 0-10 scale with standing & gait activities.    Time 4    Period Weeks    Status New    Target Date 05/26/20      PT LONG TERM GOAL #3   Title left ankle PROM within 5* of right ankle    Time 4    Period Weeks    Status New    Target Date 05/26/20                  Plan - 04/21/20 1612    Clinical Impression Statement This 69yo male fractured his left ankle 7.5 months ago. He presented to PT with limitations in ankle for mobility & pain with increased activity level. He has some edema in ankle present. He has decreased range & strength in ankle. His current exercise program is riding stationary bike only. He would benefit from instruction in progressive fitness program to include flexibility, strength & balance.    Personal Factors and Comorbidities Comorbidity 3+;Time since onset of injury/illness/exacerbation    Comorbidities Left THA 02/15/18, CAD, cardiomyopathy, implanted cardioverter-defibrillator,  Left ventricular apical thrombus, HTN, Fibromyalgia, Back surgery 2002 with RLE weakness    Stability/Clinical Decision Making Stable/Uncomplicated    Clinical Decision Making Low    Rehab Potential Good    PT Frequency 1x / week    PT Duration 4 weeks    PT Treatment/Interventions ADLs/Self Care Home Management;Gait training;Stair training;Functional mobility training;Therapeutic activities;Therapeutic exercise;Balance training;Neuromuscular re-education;Patient/family education;Joint Manipulations;Manual techniques    PT Next Visit Plan update HEP to include progressive strength, balance & flexibility and add walking program    PT Home Exercise Plan Access Code: [email or web address]    Consulted and Agree with Plan of Care Patient           Patient will benefit from skilled therapeutic intervention in order to improve the following deficits and impairments:  Abnormal gait,Decreased activity tolerance,Decreased  balance,Decreased endurance,Decreased mobility,Decreased range of motion,Decreased strength,Pain  Visit Diagnosis: Pain in left ankle and joints of left foot  Stiffness of left ankle, not elsewhere classified  Muscle weakness (generalized)  Other abnormalities of gait and mobility  Unsteadiness on feet  Localized edema     Problem List Patient Active Problem List   Diagnosis Date Noted  . Hyperlipidemia, unspecified 09/07/2018  . 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  . Unilateral primary osteoarthritis, left hip 12/18/2017  . Unilateral primary osteoarthritis, right hip 12/18/2017  . Fitting or adjustment of automatic implantable cardioverter-defibrillator 06/07/2016  . Hyperkalemia 04/16/2014  . ICD (implantable cardioverter-defibrillator), single, in situ 03/14/2011  . Coronary artery disease 03/07/2011  . Essential hypertension 03/07/2011  . Fibromyalgia 03/07/2011  . Ischemic cardiomyopathy 03/07/2011  . Left ventricular apical thrombus 03/07/2011  . Low testosterone 03/07/2011  . Obstructive sleep apnea on CPAP 03/07/2011    Jamey Reas PT, DPT 04/21/2020, 4:24 PM  [facility] Physical Therapy [address] [place], [place], [address] Phone: [phone]   Fax:  [phone]  Name: [name] Hall MRN: [id number] Date of Birth: 1951-03-28

## 2020-04-21 NOTE — Patient Instructions (Signed)
Access Code: [email or web address] URL: [email or web address] Date: 04/21/2020 Prepared by: Jamey Reas  Exercises Standing Ankle Dorsiflexion Stretch - 1 x daily - 7 x weekly - 1 sets - 2-3 reps - 20-30 seconds hold Seated Ankle Inversion Eversion PROM - 1 x daily - 7 x weekly - 1 sets - 10 reps - 10 seconds hold Heel Toe Raises with Counter Support - 1 x daily - 7 x weekly - 1 sets - 10 reps - 5 seconds hold Long Sitting Isometric Ankle Inversion in Plantar Flexion with Ball at Wall - 1 x daily - 7 x weekly - 1 sets - 10 reps - 5 seconds hold Isometric Ankle Eversion at Wall - 1 x daily - 7 x weekly - 1 sets - 10 reps - 5 seconds hold

## 2020-04-23 DIAGNOSIS — L814 Other melanin hyperpigmentation: Secondary | ICD-10-CM | POA: Diagnosis not present

## 2020-04-23 DIAGNOSIS — Z85828 Personal history of other malignant neoplasm of skin: Secondary | ICD-10-CM | POA: Diagnosis not present

## 2020-04-23 DIAGNOSIS — D692 Other nonthrombocytopenic purpura: Secondary | ICD-10-CM | POA: Diagnosis not present

## 2020-04-23 DIAGNOSIS — L821 Other seborrheic keratosis: Secondary | ICD-10-CM | POA: Diagnosis not present

## 2020-04-23 DIAGNOSIS — D1801 Hemangioma of skin and subcutaneous tissue: Secondary | ICD-10-CM | POA: Diagnosis not present

## 2020-04-23 DIAGNOSIS — L570 Actinic keratosis: Secondary | ICD-10-CM | POA: Diagnosis not present

## 2020-04-23 DIAGNOSIS — D225 Melanocytic nevi of trunk: Secondary | ICD-10-CM | POA: Diagnosis not present

## 2020-05-03 IMAGING — DX DG PORTABLE PELVIS
1 series · 1 of 1 positions shown · non-contrast
Comparison: Portable exam 0053 hours compared to intraoperative
images of 02/15/2018

CLINICAL DATA: Post LEFT total hip replacement

EXAM:
PORTABLE PELVIS 1-2 VIEWS

[pelvis ap]
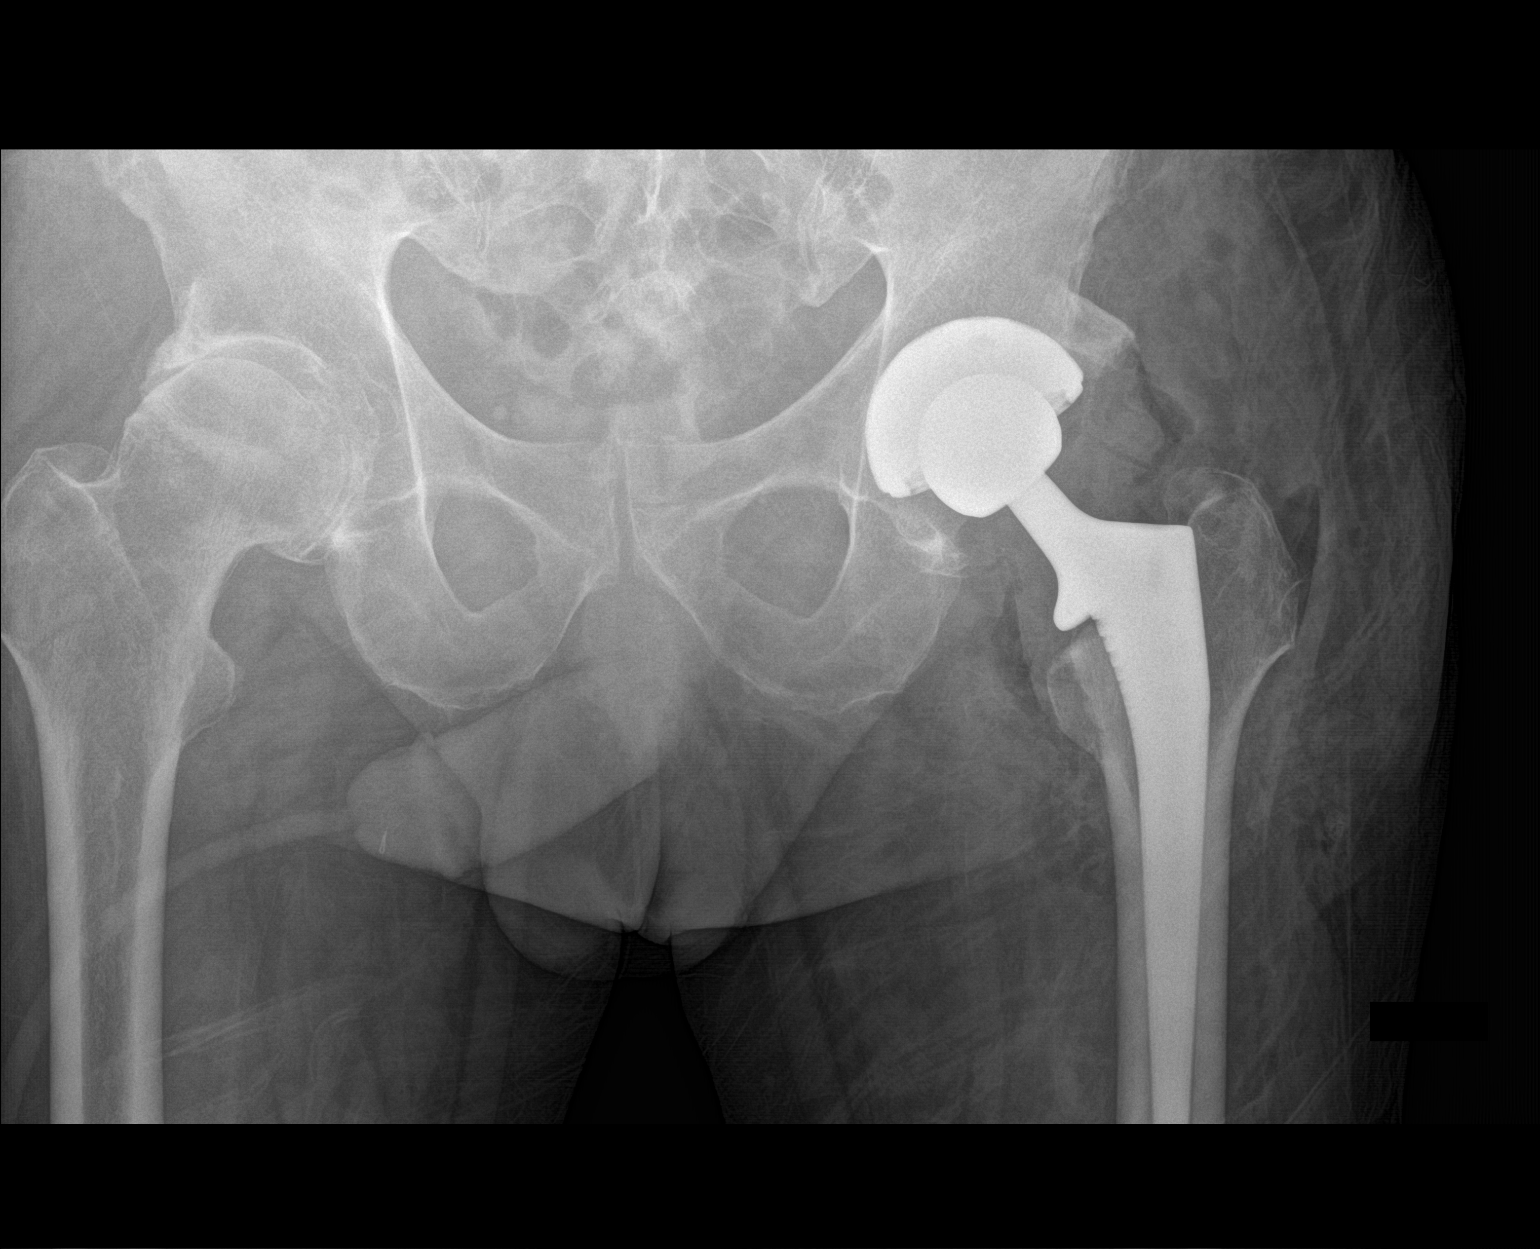

[1 of 1 positions shown; findings below may reference images not displayed]

FINDINGS: LEFT hip prosthesis identified.

Bones demineralized.

No acute fracture, dislocation or bone destruction identified on
single AP view.

Degenerative changes of the RIGHT joint space also seen.

Postsurgical changes of the soft tissues in the LEFT hip region also
identified.
IMPRESSION: LEFT hip prosthesis without acute complication.

Osseous demineralization with degenerative changes of the RIGHT hip.

## 2020-05-05 ENCOUNTER — Encounter: Payer: Self-pay | Admitting: Physical Therapy

## 2020-05-05 ENCOUNTER — Other Ambulatory Visit: Payer: Self-pay

## 2020-05-05 ENCOUNTER — Ambulatory Visit: Payer: PPO | Admitting: Physical Therapy

## 2020-05-05 DIAGNOSIS — R2689 Other abnormalities of gait and mobility: Secondary | ICD-10-CM

## 2020-05-05 DIAGNOSIS — R600 Localized edema: Secondary | ICD-10-CM | POA: Diagnosis not present

## 2020-05-05 DIAGNOSIS — M25672 Stiffness of left ankle, not elsewhere classified: Secondary | ICD-10-CM | POA: Diagnosis not present

## 2020-05-05 DIAGNOSIS — M25572 Pain in left ankle and joints of left foot: Secondary | ICD-10-CM | POA: Diagnosis not present

## 2020-05-05 DIAGNOSIS — M6281 Muscle weakness (generalized): Secondary | ICD-10-CM

## 2020-05-05 DIAGNOSIS — R2681 Unsteadiness on feet: Secondary | ICD-10-CM | POA: Diagnosis not present

## 2020-05-05 NOTE — Therapy (Signed)
[facility] Physical Therapy [address] [place], [place], [address] Phone: [phone]   Fax:  [phone]  Physical Therapy Treatment  Patient Details  Name: [name] Hall MRN: [id number] Date of Birth: March 25, 1951 Referring Provider (PT): Erskine Emery, [place]   Encounter Date: 05/05/2020   PT End of Session - 05/05/20 1307    Visit Number 2    Number of Visits 5    Date for PT Re-Evaluation 05/27/19    Authorization Type [employer]    Authorization Time Period $15 copay    PT Start Time 1300    PT Stop Time 1345    PT Time Calculation (min) 45 min    Activity Tolerance Patient tolerated treatment well    Behavior During Therapy [facility] for tasks assessed/performed           Past Medical History:  Diagnosis Date  . CAD (coronary artery disease)   . CHF (congestive heart failure) ([place])   . Complication of anesthesia    slow to wake after a surgery years ago ; but no issues with subsequent surgeries   . Fibromyalgia   . HLD (hyperlipidemia)   . Hx of CABG   . Hypertension    denies   . Ischemic cardiomyopathy   . 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 Myocardial infarction ([place])    2012    . Sleep apnea    no CPAP in use     Past Surgical History:  Procedure Laterality Date  . CARDIAC CATHETERIZATION  2012   X2 with 2 stents   . CORONARY ARTERY BYPASS GRAFT  2007   quadruple   . ICD IMPLANT  2012  . partial discectomy   1995  . SPINAL FUSION  2003   lumbar   . TOTAL HIP ARTHROPLASTY Left 02/15/2018   Procedure: LEFT TOTAL HIP ARTHROPLASTY ANTERIOR APPROACH;  Surgeon: Mcarthur Rossetti, MD;  Location: [facility];  Service: Orthopedics;  Laterality: Left;    There were no vitals filed for this visit.   Subjective Assessment - 05/05/20 1300    Subjective His chronic back pain flared up a couple of weeks ago.  He has not been to doctor yet.  He has not done ankle exercises due to back pain.    Pertinent History  Left THA 02/15/18, CAD, cardiomyopathy, implanted cardioverter-defibrillator,  Left ventricular apical thrombus, HTN, Fibromyalgia, Back surgery 2002 with RLE weakness    Patient Stated Goals to decrease ankle pain with activities & get mobility back.    Currently in Pain? Yes    Pain Score 2     Pain Location Ankle    Pain Orientation Left    Pain Descriptors / Indicators Burning    Pain Type Chronic pain    Pain Onset More than a month ago    Aggravating Factors  using ankle, if sits for a while then gets up, walking esp in mornings    Pain Relieving Factors moving    Multiple Pain Sites Yes    Pain Score 5    Pain Location Back    Pain Orientation Lower;Right    Pain Descriptors / Indicators Sharp    Pain Type Chronic pain    Pain Onset 1 to 4 weeks ago    Aggravating Factors  sitting wrong position or movement    Pain Relieving Factors in past, inversion table, moving back  [facility] Adult PT Treatment/Exercise - 05/05/20 1300      Self-Care   Self-Care ADL's    ADL's Pt reports sitting on elevated toilet aggravates his back.  PT recommended trialing feet on step or 4" box. Pt verbalized understanding.      Neuro Re-ed    Neuro Re-ed Details  standing on towel roll EO with head movements 4 directions & tandem stance 60 seconds LLE front & back.  added to HEP.      Exercises   Exercises Ankle      Ankle Exercises: Stretches   Soleus Stretch 2 reps;20 seconds    Soleus Stretch Limitations step heel depressed    Gastroc Stretch 2 reps;20 seconds    Gastroc Stretch Limitations step heel depression    Other Stretch inversion & eversion step with heel drpession knee flexed pressing knee medial 20 seconds & lateral 20 seconds      Ankle Exercises: Aerobic   Recumbent Bike level 1 (due to LBP) 8 min                       PT Long Term Goals - 04/21/20 1621      PT LONG TERM GOAL #1   Title Patient verbalizes &  demonstrates understanding of progressive HEP / fitness plan.    Time 4    Period Weeks    Status New    Target Date 05/26/20      PT LONG TERM GOAL #2   Title Patient reports pain increases </= 2 increments on 0-10 scale with standing & gait activities.    Time 4    Period Weeks    Status New    Target Date 05/26/20      PT LONG TERM GOAL #3   Title left ankle PROM within 5* of right ankle    Time 4    Period Weeks    Status New    Target Date 05/26/20                 Plan - 05/05/20 1308    Clinical Impression Statement Patient reports that he has had a flare-up of low back pain limiting his activities.  PT advanced HEP to include balance activities that would facilitate ankle strategies. He appears to understand them.    Personal Factors and Comorbidities Comorbidity 3+;Time since onset of injury/illness/exacerbation    Comorbidities Left THA 02/15/18, CAD, cardiomyopathy, implanted cardioverter-defibrillator,  Left ventricular apical thrombus, HTN, Fibromyalgia, Back surgery 2002 with RLE weakness    Stability/Clinical Decision Making Stable/Uncomplicated    Rehab Potential Good    PT Frequency 1x / week    PT Duration 4 weeks    PT Treatment/Interventions ADLs/Self Care Home Management;Gait training;Stair training;Functional mobility training;Therapeutic activities;Therapeutic exercise;Balance training;Neuromuscular re-education;Patient/family education;Joint Manipulations;Manual techniques    PT Next Visit Plan update HEP to include progressive strength, balance & flexibility and add walking program    PT Home Exercise Plan Access Code: [email or web address]    Consulted and Agree with Plan of Care Patient           Patient will benefit from skilled therapeutic intervention in order to improve the following deficits and impairments:  Abnormal gait,Decreased activity tolerance,Decreased balance,Decreased endurance,Decreased mobility,Decreased range of motion,Decreased  strength,Pain  Visit Diagnosis: Pain in left ankle and joints of left foot  Stiffness of left ankle, not elsewhere classified  Muscle weakness (generalized)  Other abnormalities of gait and mobility  Unsteadiness on feet  Localized  edema     Problem List Patient Active Problem List   Diagnosis Date Noted  . Hyperlipidemia, unspecified 09/07/2018  . 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  . Unilateral primary osteoarthritis, left hip 12/18/2017  . Unilateral primary osteoarthritis, right hip 12/18/2017  . Fitting or adjustment of automatic implantable cardioverter-defibrillator 06/07/2016  . Hyperkalemia 04/16/2014  . ICD (implantable cardioverter-defibrillator), single, in situ 03/14/2011  . Coronary artery disease 03/07/2011  . Essential hypertension 03/07/2011  . Fibromyalgia 03/07/2011  . Ischemic cardiomyopathy 03/07/2011  . Left ventricular apical thrombus 03/07/2011  . Low testosterone 03/07/2011  . Obstructive sleep apnea on CPAP 03/07/2011    Jamey Reas, PT, DPT 05/05/2020, 8:46 PM  [facility] Physical Therapy [address] [place], [place], [address] Phone: [phone]   Fax:  [phone]  Name: [name] Hall MRN: [id number] Date of Birth: 01/01/1951

## 2020-05-05 NOTE — Patient Instructions (Signed)
Access Code: [email or web address] URL: [email or web address] Date: 05/05/2020 Prepared by: Vladimir Faster  Exercises Standing Ankle Dorsiflexion Stretch - 1 x daily - 7 x weekly - 1 sets - 2-3 reps - 20-30 seconds hold Seated Ankle Inversion Eversion PROM - 1 x daily - 7 x weekly - 1 sets - 10 reps - 10 seconds hold Heel Toe Raises with Counter Support - 1 x daily - 7 x weekly - 1 sets - 10 reps - 5 seconds hold Long Sitting Isometric Ankle Inversion in Plantar Flexion with Ball at [employer] - 1 x daily - 7 x weekly - 1 sets - 10 reps - 5 seconds hold Isometric Ankle Eversion at Wall - 1 x daily - 7 x weekly - 1 sets - 10 reps - 5 seconds hold Plantar Fascia Stretch on Step - 1 x daily - 7 x weekly - 3 sets - 2 reps - 20 seconds hold Gastroc Stretch on Step - 1 x daily - 7 x weekly - 1 sets - 2 reps - 20-30 seconds hold Wide Stance on Foam Eyes Open Head Turns - 1 x daily - 5 x weekly - 1 sets - 10 reps - 5 seconds hold Stand with One foot in front of other Eyes Open (No Head moving) - 1 x daily - 5 x weekly - 1 sets - 2 reps - 60 seconds hold Seated Anterior Tibialis Stretch - 1 x daily - 7 x weekly - 1 sets - 2 reps - 20 seconds hold

## 2020-05-06 DIAGNOSIS — N400 Enlarged prostate without lower urinary tract symptoms: Secondary | ICD-10-CM | POA: Diagnosis not present

## 2020-05-06 DIAGNOSIS — R4189 Other symptoms and signs involving cognitive functions and awareness: Secondary | ICD-10-CM | POA: Diagnosis not present

## 2020-05-06 DIAGNOSIS — Z95810 Presence of automatic (implantable) cardiac defibrillator: Secondary | ICD-10-CM | POA: Diagnosis not present

## 2020-05-06 DIAGNOSIS — I5022 Chronic systolic (congestive) heart failure: Secondary | ICD-10-CM | POA: Diagnosis not present

## 2020-05-06 LAB — CBC AND DIFFERENTIAL
HCT: 48 (ref 41–53)
Hemoglobin: 16.3 (ref 13.5–17.5)
Platelets: 123 — AB (ref 150–399)
WBC: 3.7

## 2020-05-06 LAB — BASIC METABOLIC PANEL
BUN: 11 (ref 4–21)
CO2: 28 — AB (ref 13–22)
Chloride: 105 (ref 99–108)
Creatinine: 0.9 (ref 0.6–1.3)
Glucose: 78
Potassium: 5.5 — AB (ref 3.4–5.3)
Sodium: 139 (ref 137–147)

## 2020-05-06 LAB — HEPATIC FUNCTION PANEL
ALT: 10 (ref 10–40)
AST: 16 (ref 14–40)
Alkaline Phosphatase: 116 (ref 25–125)
Bilirubin, Total: 0.9

## 2020-05-06 LAB — VITAMIN B12: Vitamin B-12: 573

## 2020-05-06 LAB — COMPREHENSIVE METABOLIC PANEL
Albumin: 4.4 (ref 3.5–5.0)
Calcium: 9.9 (ref 8.7–10.7)
eGFR: 80

## 2020-05-06 LAB — CBC: RBC: 5.33 — AB (ref 3.87–5.11)

## 2020-05-06 LAB — TSH: TSH: 1.83 (ref 0.41–5.90)

## 2020-05-12 ENCOUNTER — Other Ambulatory Visit: Payer: Self-pay

## 2020-05-12 ENCOUNTER — Encounter: Payer: Self-pay | Admitting: Physical Therapy

## 2020-05-12 ENCOUNTER — Ambulatory Visit: Payer: PPO | Admitting: Physical Therapy

## 2020-05-12 DIAGNOSIS — M25672 Stiffness of left ankle, not elsewhere classified: Secondary | ICD-10-CM | POA: Diagnosis not present

## 2020-05-12 DIAGNOSIS — R600 Localized edema: Secondary | ICD-10-CM | POA: Diagnosis not present

## 2020-05-12 DIAGNOSIS — M25572 Pain in left ankle and joints of left foot: Secondary | ICD-10-CM

## 2020-05-12 DIAGNOSIS — R2689 Other abnormalities of gait and mobility: Secondary | ICD-10-CM

## 2020-05-12 DIAGNOSIS — R2681 Unsteadiness on feet: Secondary | ICD-10-CM

## 2020-05-12 DIAGNOSIS — M6281 Muscle weakness (generalized): Secondary | ICD-10-CM

## 2020-05-12 NOTE — Therapy (Addendum)
[facility] [place], [place], [address] Phone: [phone]   Fax:  [phone]  Physical Therapy Treatment  Patient Details  Name: [name] Hall MRN: [id number] Date of Birth: Jan 17, 1951 Referring Provider (PT): Erskine Emery, [place]   Encounter Date: 05/12/2020   PT End of Session - 05/12/20 1257    Visit Number 3    Number of Visits 5    Date for PT Re-Evaluation 05/27/19    Authorization Type [employer]    Authorization Time Period $15 copay    PT Start Time 1258    PT Stop Time 1326    PT Time Calculation (min) 28 min    Activity Tolerance Patient tolerated treatment well    Behavior During Therapy [facility] for tasks assessed/performed           Past Medical History:  Diagnosis Date   CAD (coronary artery disease)    CHF (congestive heart failure) ([place])    Complication of anesthesia    slow to wake after a surgery years ago ; but no issues with subsequent surgeries    Fibromyalgia    HLD (hyperlipidemia)    Hx of CABG    Hypertension    denies    Ischemic cardiomyopathy    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   Subjective Assessment - 05/12/20 1258    Subjective He has not been doing exercises.  His ankle is stiff    Pertinent History Left THA 02/15/18, CAD, cardiomyopathy, implanted cardioverter-defibrillator,  Left  ventricular apical thrombus, HTN, Fibromyalgia, Back surgery 2002 with RLE weakness    Patient Stated Goals to decrease ankle pain with activities & get mobility back.    Currently in Pain? Yes    Pain Score 3     Pain Location Ankle    Pain Orientation Left    Pain Descriptors / Indicators Tightness;Aching    Pain Type Chronic pain    Pain Onset More than a month ago    Pain Frequency Intermittent    Aggravating Factors  sitting for a while then getting up, increased activity    Pain Relieving Factors stretches    Pain Onset 1 to 4 weeks ago           PT & pt discussed benefits to exercise program especially with arthritis. Pt verbalized understanding.      1.Stand on foam or compliant surface. Shift hips forward/backward, right/left & circles like hoola hoop.    2. Stand on foam with hands on wall. A. Take a deep breath then slide your hands up the wall as high as possible. Look at your hands. B. Hands on towel.  Take a deep breath slide your hands up wall, then slide hands to right to middle  to left to middle then slide them back down.  3. Pick up marbles or items with toes. B. Place a pen in front of heel try to move marker to right & to left side of marker.                       PT Education - 05/12/20 1328    Education Details updated HEP to include reactionary balance / ankle strategy & foot exercises.    Person(s) Educated Patient    Methods Explanation;Demonstration;Verbal cues;Handout    Comprehension Verbalized understanding;Returned demonstration               PT Long Term Goals - 04/21/20 1621      PT LONG TERM GOAL #1   Title Patient verbalizes & demonstrates understanding of progressive HEP / fitness plan.    Time 4    Period Weeks    Status New    Target Date 05/26/20      PT LONG TERM GOAL #2   Title Patient reports pain increases </= 2 increments on 0-10 scale with standing & gait activities.    Time 4    Period Weeks     Status New    Target Date 05/26/20      PT LONG TERM GOAL #3   Title left ankle PROM within 5* of right ankle    Time 4    Period Weeks    Status New    Target Date 05/26/20                 Plan - 05/12/20 1258    Clinical Impression Statement Patient reports that he has not been compliant with exercises. He requested holding PT next week to enable time to work on his exercises. He reports that it is not a lack of understanding. PT instructed in some balance activities that would facilitate ankle strategy like standing on foam compliant surface performing stationary ADL like washing dishes.    Personal Factors and Comorbidities Comorbidity 3+;Time since onset of injury/illness/exacerbation    Comorbidities Left THA 02/15/18, CAD, cardiomyopathy, implanted cardioverter-defibrillator,  Left ventricular apical thrombus, HTN, Fibromyalgia, Back surgery 2002 with RLE weakness    Stability/Clinical Decision Making Stable/Uncomplicated    Rehab Potential Good    PT Frequency 1x / week    PT Duration 4 weeks    PT Treatment/Interventions ADLs/Self Care Home Management;Gait training;Stair training;Functional mobility training;Therapeutic activities;Therapeutic exercise;Balance training;Neuromuscular re-education;Patient/family education;Joint Manipulations;Manual techniques    PT Next Visit Plan check HEP compliance,    PT Home Exercise Plan Access Code: [email or web address]    Consulted and Agree with Plan of Care Patient           Patient will benefit from skilled therapeutic intervention in order to improve the following deficits and impairments:  Abnormal gait,Decreased activity tolerance,Decreased balance,Decreased endurance,Decreased mobility,Decreased range of motion,Decreased strength,Pain  Visit Diagnosis: Pain in left ankle and joints of left foot  Stiffness of left ankle, not elsewhere classified  Muscle weakness (generalized)  Other abnormalities of gait and  mobility  Unsteadiness on feet  Localized edema     Problem List Patient Active Problem List   Diagnosis Date Noted   Hyperlipidemia, unspecified 09/07/2018   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   Unilateral primary osteoarthritis, left hip 12/18/2017   Unilateral primary osteoarthritis, right hip 12/18/2017   Fitting or adjustment of automatic implantable cardioverter-defibrillator 06/07/2016   Hyperkalemia 04/16/2014   ICD (implantable cardioverter-defibrillator),  single, in situ 03/14/2011   Coronary artery disease 03/07/2011   Essential hypertension 03/07/2011   Fibromyalgia 03/07/2011   Ischemic cardiomyopathy 03/07/2011   Left ventricular apical thrombus 03/07/2011   Low testosterone 03/07/2011   Obstructive sleep apnea on CPAP 03/07/2011    Jamey Reas, PT, DPT 05/12/2020, 1:32 PM  [facility] Physical Therapy [address] [place], [place], [address] Phone: [phone]   Fax:  [phone]  Name: [name] Hall MRN: [id number] Date of Birth: 06-Jan-1951    PHYSICAL THERAPY DISCHARGE SUMMARY  Visits from Start of Care: 3  Current functional level related to goals / functional outcomes: Patient called PT clinic on 05/28/2020 requesting discharge. So LTGs were not checked.  He appeared to understand HEP but reported not compliant. He verbalized awareness to benefits from exercises.    Remaining deficits: No noted changes due to limited visits & compliance outside of PT.    Education / Equipment: HEP  Plan: Patient agrees to discharge.  Patient goals were not met. Patient is being discharged due to the patient's request.  ?????         Jamey Reas, PT, DPT Physical Therapist Specializing in Prosthetic Rehab [facility] [address] [place], [place] [address] Phone [phone] FAX [phone]

## 2020-05-12 NOTE — Patient Instructions (Signed)
1.Stand on foam or compliant surface. Shift hips forward/backward, right/left & circles like hoola hoop.    2. Stand on foam with hands on wall. A. Take a deep breath then slide your hands up the wall as high as possible. Look at your hands. B. Hands on towel.  Take a deep breath slide your hands up wall, then slide hands to right to middle to left to middle then slide them back down.  3. Pick up marbles or items with toes. B. Place a pen in front of heel try to move marker to right & to left side of marker.

## 2020-05-19 ENCOUNTER — Encounter: Payer: PPO | Admitting: Physical Therapy

## 2020-05-20 DIAGNOSIS — I255 Ischemic cardiomyopathy: Secondary | ICD-10-CM | POA: Diagnosis not present

## 2020-05-20 DIAGNOSIS — Z95810 Presence of automatic (implantable) cardiac defibrillator: Secondary | ICD-10-CM | POA: Diagnosis not present

## 2020-05-24 DIAGNOSIS — Z95810 Presence of automatic (implantable) cardiac defibrillator: Secondary | ICD-10-CM | POA: Diagnosis not present

## 2020-05-24 DIAGNOSIS — I255 Ischemic cardiomyopathy: Secondary | ICD-10-CM | POA: Diagnosis not present

## 2020-05-26 ENCOUNTER — Encounter: Payer: PPO | Admitting: Physical Therapy

## 2020-06-02 ENCOUNTER — Encounter: Payer: PPO | Admitting: Physical Therapy

## 2020-06-02 DIAGNOSIS — R799 Abnormal finding of blood chemistry, unspecified: Secondary | ICD-10-CM | POA: Diagnosis not present

## 2020-06-02 DIAGNOSIS — E782 Mixed hyperlipidemia: Secondary | ICD-10-CM | POA: Diagnosis not present

## 2020-06-02 LAB — LIPID PANEL
Cholesterol: 168 (ref 0–200)
HDL: 35 (ref 35–70)
LDL Cholesterol: 110
LDl/HDL Ratio: 4.8
Triglycerides: 120 (ref 40–160)

## 2020-06-02 LAB — CBC: RBC: 4.84 (ref 3.87–5.11)

## 2020-06-02 LAB — CBC AND DIFFERENTIAL
HCT: 44 (ref 41–53)
Hemoglobin: 15 (ref 13.5–17.5)
Platelets: 122 — AB (ref 150–399)
WBC: 3.9

## 2020-06-23 ENCOUNTER — Telehealth: Payer: Self-pay | Admitting: Hematology and Oncology

## 2020-06-23 NOTE — Telephone Encounter (Signed)
Received a new hem referral from Dr. Radene Ou for persistent low WBC and decreased platelets. Pt returned my call and has been scheduled to see Dr. Chryl Heck on 3/3 at1120am. Pt aware to arrive 20 minutes early.

## 2020-07-02 ENCOUNTER — Inpatient Hospital Stay: Payer: PPO

## 2020-07-02 ENCOUNTER — Encounter: Payer: Self-pay | Admitting: Hematology and Oncology

## 2020-07-02 ENCOUNTER — Inpatient Hospital Stay: Payer: PPO | Attending: Hematology and Oncology | Admitting: Hematology and Oncology

## 2020-07-02 ENCOUNTER — Telehealth: Payer: Self-pay | Admitting: Hematology and Oncology

## 2020-07-02 ENCOUNTER — Other Ambulatory Visit: Payer: Self-pay

## 2020-07-02 VITALS — BP 112/64 | HR 55 | Temp 97.2°F | Resp 12 | Ht 70.0 in | Wt 188.6 lb

## 2020-07-02 DIAGNOSIS — I509 Heart failure, unspecified: Secondary | ICD-10-CM | POA: Diagnosis not present

## 2020-07-02 DIAGNOSIS — Z8042 Family history of malignant neoplasm of prostate: Secondary | ICD-10-CM | POA: Diagnosis not present

## 2020-07-02 DIAGNOSIS — I110 Hypertensive heart disease with heart failure: Secondary | ICD-10-CM | POA: Insufficient documentation

## 2020-07-02 DIAGNOSIS — D72819 Decreased white blood cell count, unspecified: Secondary | ICD-10-CM | POA: Diagnosis not present

## 2020-07-02 DIAGNOSIS — I2510 Atherosclerotic heart disease of native coronary artery without angina pectoris: Secondary | ICD-10-CM | POA: Insufficient documentation

## 2020-07-02 DIAGNOSIS — I255 Ischemic cardiomyopathy: Secondary | ICD-10-CM | POA: Insufficient documentation

## 2020-07-02 DIAGNOSIS — D696 Thrombocytopenia, unspecified: Secondary | ICD-10-CM

## 2020-07-02 DIAGNOSIS — Z803 Family history of malignant neoplasm of breast: Secondary | ICD-10-CM | POA: Diagnosis not present

## 2020-07-02 LAB — CMP (CANCER CENTER ONLY)
ALT: 11 U/L (ref 0–44)
AST: 25 U/L (ref 15–41)
Albumin: 4.1 g/dL (ref 3.5–5.0)
Alkaline Phosphatase: 107 U/L (ref 38–126)
Anion gap: 7 (ref 5–15)
BUN: 9 mg/dL (ref 8–23)
CO2: 24 mmol/L (ref 22–32)
Calcium: 9 mg/dL (ref 8.9–10.3)
Chloride: 105 mmol/L (ref 98–111)
Creatinine: 0.95 mg/dL (ref 0.61–1.24)
GFR, Estimated: >60 mL/min (ref >60)
Glucose, Bld: 94 mg/dL (ref 70–99)
Potassium: 4.9 mmol/L (ref 3.5–5.1)
Sodium: 136 mmol/L (ref 135–145)
Total Bilirubin: 0.6 mg/dL (ref 0.3–1.2)
Total Protein: 7.3 g/dL (ref 6.5–8.1)

## 2020-07-02 LAB — CBC WITH DIFFERENTIAL/PLATELET
Abs Immature Granulocytes: 0.02 10*3/uL (ref 0.00–0.07)
Basophils Absolute: 0.1 10*3/uL (ref 0.0–0.1)
Basophils Relative: 1 %
Eosinophils Absolute: 0.2 10*3/uL (ref 0.0–0.5)
Eosinophils Relative: 4 %
HCT: 44 % (ref 39.0–52.0)
Hemoglobin: 14.6 g/dL (ref 13.0–17.0)
Immature Granulocytes: 1 %
Lymphocytes Relative: 18 %
Lymphs Abs: 0.8 10*3/uL (ref 0.7–4.0)
MCH: 30 pg (ref 26.0–34.0)
MCHC: 33.2 g/dL (ref 30.0–36.0)
MCV: 90.5 fL (ref 80.0–100.0)
Monocytes Absolute: 0.4 10*3/uL (ref 0.1–1.0)
Monocytes Relative: 10 %
Neutro Abs: 2.7 10*3/uL (ref 1.7–7.7)
Neutrophils Relative %: 66 %
Platelets: 129 10*3/uL — ABNORMAL LOW (ref 150–400)
RBC: 4.86 MIL/uL (ref 4.22–5.81)
RDW: 13.3 % (ref 11.5–15.5)
WBC: 4.1 10*3/uL (ref 4.0–10.5)
nRBC: 0 % (ref 0.0–0.2)

## 2020-07-02 LAB — FERRITIN: Ferritin: 109 ng/mL (ref 24–336)

## 2020-07-02 LAB — IRON AND TIBC
Iron: 71 ug/dL (ref 42–163)
Saturation Ratios: 26 % (ref 20–55)
TIBC: 275 ug/dL (ref 202–409)
UIBC: 204 ug/dL (ref 117–376)

## 2020-07-02 LAB — RETICULOCYTES
Immature Retic Fract: 9.4 % (ref 2.3–15.9)
RBC.: 4.78 MIL/uL (ref 4.22–5.81)
Retic Count, Absolute: 38.2 10*3/uL (ref 19.0–186.0)
Retic Ct Pct: 0.8 % (ref 0.4–3.1)

## 2020-07-02 LAB — LACTATE DEHYDROGENASE: LDH: 112 U/L (ref 98–192)

## 2020-07-02 LAB — HEPATITIS B SURFACE ANTIGEN: Hepatitis B Surface Ag: NONREACTIVE

## 2020-07-02 NOTE — Telephone Encounter (Signed)
Scheduled appointments per 3/3 los. Spoke to patient who is aware of appointments dates and times.

## 2020-07-02 NOTE — Progress Notes (Signed)
[name] Hall NOTE  Patient Care Team: Via, Lennette Bihari, MD as PCP - General (Family Medicine)  CHIEF COMPLAINTS/PURPOSE OF CONSULTATION:  Cytopenia of unknown significance.  ASSESSMENT & PLAN:  No problem-specific Assessment & Plan notes found for this encounter.  No orders of the defined types were placed in this encounter.  This is a very pleasant 70 year old male patient with past medical history significant for coronary artery disease, congestive heart failure and ischemic cardiomyopathy referred to hematology for evaluation of leukopenia and thrombocytopenia.  [name] Hall denies any new complaints except for intermittent fatigue.  No changes in overall health.  No new medications, autoimmune diseases/nutritional deficiencies, known hepatitis. Physical examination healthy-appearing 70 year old male patient with no evidence of lymphadenopathy or hepatosplenomegaly. I reviewed his labs which has shown persistent thrombocytopenia for several years which may have slightly progressed.  Leukopenia appears to be relatively new. I discussed common causes of cytopenias including but not limited to medications, autoimmune disorders, nutritional deficiencies, thyroid problems, bone marrow disorders. I reviewed his U27, folic acid, hepatitis C, TSH labs which were unremarkable.  I ordered CBC, smear review, iron panel, ferritin, LDH, reticulocyte count, hepatitis B surface antigen, ANA panel today.  The above-mentioned labs are unremarkable, we have discussed 2 options.  We will proceed with upfront bone marrow biopsy to look for clonal cytopenia versus monitor labs periodically and to bone marrow biopsy noticeable change in the labs.  He is hoping to avoid procedures and would rather like to be monitored.  I have ordered repeat labs today, CBC every 4 weeks and return to clinic in 8 weeks. Thank you for consulting [place] in the care of this patient.  Please do not hesitate to contact us with  any additional questions or concerns  HISTORY OF PRESENTING ILLNESS:   [name] Hall 70 y.o. male is here because of abnormal labs.  This is a very pleasant 70 year old male patient with a past medical history significant for coronary artery disease, congestive heart failure status post defibrillator placement, ischemic cardiomyopathy referred to hematology for evaluation of leukopenia and thrombocytopenia.  [name] Hall arrived to the appointment today by himself.  He tells me that he has good days and bad days but overall he feels like he has decent energy. He denied any fevers, drenching night sweats, loss of appetite or loss of weight.  He may have gained about 5 pounds of weight.  He has been a vegan for the past 10 years approximately.  He denies any major changes in shortness of breath chest pain or chest pressure.  No changes in bowel habits, hematochezia or melena.  No changes in urinary habits.  He denies any new medications in the past 2 years.  No known autoimmune diseases, hypothyroidism/hyperthyroidism, known nutritional deficiencies.  Rest of the pertinent 10 point ROS reviewed and unremarkable.  REVIEW OF SYSTEMS:   Constitutional: Denies fevers, chills or abnormal night sweats Eyes: Denies blurriness of vision, double vision or watery eyes Ears, nose, mouth, throat, and face: Denies mucositis or sore throat Respiratory: Denies cough, dyspnea or wheezes Cardiovascular: Denies palpitation, chest discomfort or lower extremity swelling Gastrointestinal:  Denies nausea, heartburn or change in bowel habits Skin: Denies abnormal skin rashes Lymphatics: Denies new lymphadenopathy or easy bruising Neurological:Denies numbness, tingling or new weaknesses Behavioral/Psych: Mood is stable, no new changes  All other systems were reviewed with the patient and are negative.  MEDICAL HISTORY:  Past Medical History:  Diagnosis Date  . CAD (coronary artery disease)   .  CHF (congestive  heart failure) ([place])   . Complication of anesthesia    slow to wake after a surgery years ago ; but no issues with subsequent surgeries   . Fibromyalgia   . HLD (hyperlipidemia)   . Hx of CABG   . Hypertension    denies   . Ischemic cardiomyopathy   . 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 Myocardial infarction ([place])    2012    . Sleep apnea    no CPAP in use     SURGICAL HISTORY: Past Surgical History:  Procedure Laterality Date  . CARDIAC CATHETERIZATION  2012   X2 with 2 stents   . CORONARY ARTERY BYPASS GRAFT  2007   quadruple   . ICD IMPLANT  2012  . partial discectomy   1995  . SPINAL FUSION  2003   lumbar   . TOTAL HIP ARTHROPLASTY Left 02/15/2018   Procedure: LEFT TOTAL HIP ARTHROPLASTY ANTERIOR APPROACH;  Surgeon: Mcarthur Rossetti, MD;  Location: [facility];  Service: Orthopedics;  Laterality: Left;    SOCIAL HISTORY: Social History   Socioeconomic History  . Marital status: Married    Spouse name: Not on file  . Number of children: Not on file  . Years of education: Not on file  . Highest education level: Not on file  Occupational History  . Not on file  Tobacco Use  . Smoking status: Never Smoker  . Smokeless tobacco: Never Used  Substance and Sexual Activity  . Alcohol use: Yes    Comment: occ  . Drug use: Not on file  . Sexual activity: Not on file  Other Topics Concern  . Not on file  Social History Narrative  . Not on file   Social Determinants of Health   Financial Resource Strain: Not on file  Food Insecurity: Not on file  Transportation Needs: Not on file  Physical Activity: Not on file  Stress: Not on file  Social Connections: Not on file  Intimate Partner Violence: Not on file    FAMILY HISTORY: Family History  Problem Relation Age of Onset  . Breast cancer Mother   . Prostate cancer Father     ALLERGIES:  is allergic to morphine and related, other, and statins.  MEDICATIONS:  Current Outpatient  Medications  Medication Sig Dispense Refill  . aspirin EC 81 MG tablet Take 81 mg by mouth daily.     . Cholecalciferol (VITAMIN D3) 2000 units TABS Take 2,000 Units by mouth daily. As needed    . Cyanocobalamin (VITAMIN B-12 PO) Take 2,500 mcg by mouth every other day. As needed    . ELIQUIS 5 MG TABS tablet Take 5 mg by mouth 2 (two) times daily.     Marland Kitchen lisinopril (PRINIVIL,ZESTRIL) 5 MG tablet Take 5 mg by mouth daily.     . metoprolol succinate (TOPROL-XL) 50 MG 24 hr tablet Take 50 mg by mouth daily. Patient takes a total of 75 mg daily. 50 mg in the morning and 25 mg in the evening.    . Multiple Vitamin (MULTI-VITAMINS) TABS Take 1 tablet by mouth daily.     . tadalafil (CIALIS) 5 MG tablet Take 5 mg by mouth daily.      No current facility-administered medications for this visit.     PHYSICAL EXAMINATION:  ECOG PERFORMANCE STATUS: 0 - Asymptomatic  Vitals:   07/02/20 1124  BP: 112/64  Pulse: (!) 55  Resp: 12  Temp: (!) 97.2  F (36.2 C)  SpO2: 100%   Filed Weights   07/02/20 1124  Weight: 188 lb 9.6 oz (85.5 kg)   GENERAL:alert, no distress and comfortable SKIN: skin color, texture, turgor are normal, no rashes or significant lesions EYES: normal, conjunctiva are pink and non-injected, sclera clear OROPHARYNX:no exudate, no erythema and lips, buccal mucosa, and tongue normal  NECK: supple, thyroid normal size, non-tender, without nodularity LYMPH:  no palpable lymphadenopathy in the cervical, axillary or inguinal LUNGS: clear to auscultation and percussion with normal breathing effort, defibrillator noted on the chest. HEART: regular rate & rhythm and no murmurs and no lower extremity edema ABDOMEN:abdomen soft, non-tender and normal bowel sounds.  No hepatosplenomegaly. Musculoskeletal:no cyanosis of digits and no clubbing  PSYCH: alert & oriented x 3 with fluent speech NEURO: no focal motor/sensory deficits  LABORATORY DATA:  I have reviewed the data as  listed Lab Results  Component Value Date   WBC 9.9 02/16/2018   HGB 12.5 (L) 02/16/2018   HCT 38.8 (L) 02/16/2018   MCV 92.2 02/16/2018   PLT 141 (L) 02/16/2018     Chemistry      Component Value Date/Time   NA 136 02/16/2018 0449   K 4.4 02/16/2018 0449   CL 103 02/16/2018 0449   CO2 25 02/16/2018 0449   BUN 13 02/16/2018 0449   CREATININE 0.91 02/16/2018 0449      Component Value Date/Time   CALCIUM 8.5 (L) 02/16/2018 0449   ALKPHOS 77 07/14/2010 1000   AST 56 (H) 07/14/2010 1000   ALT 13 07/14/2010 1000   BILITOT 1.3 (H) 07/14/2010 1000     TSH of 1.8 B12 of 573 Folate of 18 WBC of 3.7 Hep C ab negative. Multiple labs reviewed CBC ranging from 3700 to 4100, platelets mildly low in 120 K 2 yr ago, mild thrombocytopenia, but WBC Count was normal 9 yrs ago, again thrombocytopenia, plt count in 140K, no leukopenia No anemia currently  RADIOGRAPHIC STUDIES: I have personally reviewed the radiological images as listed and agreed with the findings in the report. No results found.  All questions were answered. The patient knows to call the clinic with any problems, questions or concerns. I spent 45 minutes in the care of this patient including H and P, review of records, counseling and coordination of care.     Benay Pike, MD 07/02/2020 11:42 AM

## 2020-07-03 LAB — PATHOLOGIST SMEAR REVIEW

## 2020-07-07 LAB — ANTINUCLEAR ANTIBODIES, IFA: ANA Ab, IFA: NEGATIVE

## 2020-07-20 ENCOUNTER — Telehealth: Payer: Self-pay

## 2020-07-20 NOTE — Telephone Encounter (Signed)
NOTES ON FILE FROM  [facility] [phone], SENT REFERRAL TO SCHEDULING

## 2020-07-29 ENCOUNTER — Ambulatory Visit: Payer: PPO | Admitting: Internal Medicine

## 2020-07-29 ENCOUNTER — Encounter: Payer: Self-pay | Admitting: Internal Medicine

## 2020-07-29 ENCOUNTER — Other Ambulatory Visit: Payer: Self-pay

## 2020-07-29 VITALS — BP 122/64 | HR 58 | Ht 70.0 in | Wt 190.8 lb

## 2020-07-29 DIAGNOSIS — I255 Ischemic cardiomyopathy: Secondary | ICD-10-CM | POA: Diagnosis not present

## 2020-07-29 DIAGNOSIS — D6869 Other thrombophilia: Secondary | ICD-10-CM

## 2020-07-29 DIAGNOSIS — I10 Essential (primary) hypertension: Secondary | ICD-10-CM

## 2020-07-29 DIAGNOSIS — I236 Thrombosis of atrium, auricular appendage, and ventricle as current complications following acute myocardial infarction: Secondary | ICD-10-CM

## 2020-07-29 DIAGNOSIS — I519 Heart disease, unspecified: Secondary | ICD-10-CM | POA: Diagnosis not present

## 2020-07-29 NOTE — Patient Instructions (Addendum)
Medication Instructions:  Your physician recommends that you continue on your current medications as directed. Please refer to the Current Medication list given to you today.  Labwork: None ordered.  Testing/Procedures: None ordered.  Follow-Up: Your physician wants you to follow-up in: one year with Thompson Grayer, MD or one of the following Advanced Practice Providers on your designated Care Team:      Legrand Como "Jonni Sanger" Chalmers Cater, [place]   You will receive a reminder letter in the mail two months in advance. If you don't receive a letter, please call our office to schedule the follow-up appointment.  Remote monitoring is used to monitor your ICD from home. This monitoring reduces the number of office visits required to check your device to one time per year. It allows [place] to keep an eye on the functioning of your device to ensure it is working properly. You are scheduled for a device check from home on 10/28/20. You may send your transmission at any time that day. If you have a wireless device, the transmission will be sent automatically. After your physician reviews your transmission, you will receive a postcard with your next transmission date.  Any Other Special Instructions Will Be Listed Below (If Applicable).  If you need a refill on your cardiac medications before your next appointment, please call your pharmacy.   Sacubitril; Valsartan Oral Tablets What is this medicine? SACUBITRIL; VALSARTAN (sak UE bi tril; val SAR tan) is a combination of a neprilysin inhibitor and a an angiotensin II receptor blocker. It treats heart failure. This medicine may be used for other purposes; ask your health care provider or pharmacist if you have questions. COMMON BRAND NAME(S): Entresto What should I tell my health care provider before I take this medicine? They need to know if you have any of these conditions:  diabetes and take a medicine that contains aliskiren  high levels of potassium in the  blood  kidney disease  liver disease  low blood pressure  an unusual or allergic reaction to sacubitril; valsartan, drugs called angiotensin converting enzyme (ACE) inhibitors, angiotensin II receptor blockers (ARBs), other medicines, foods, dyes, or preservatives  pregnant or trying to get pregnant  breast-feeding How should I use this medicine? Take this medicine by mouth. Take it as directed on the prescription label at the same time every day. You can take it with or without food. If it upsets your stomach, take it with food. Keep taking it unless your health care provider tells you to stop. Talk to your health care provider about the use of this drug in children. While it may be prescribed for children as young as 1 for selected conditions, precautions do apply. Overdosage: If you think you have taken too much of this medicine contact a poison control center or emergency room at once. NOTE: This medicine is only for you. Do not share this medicine with others. What if I miss a dose? If you miss a dose, take it as soon as you can. If it is almost time for your next dose, take only that dose. Do not take double or extra doses. What may interact with this medicine? Do not take this medicine with any of the following medicines:  aliskiren if you have diabetes  angiotensin-converting enzyme (ACE) inhibitors, like benazepril, captopril, enalapril, fosinopril, lisinopril, or ramipril  tranylcypromine This medicine may also interact with the following medicines:  angiotensin II receptor blockers (ARBs) like azilsartan, candesartan, eprosartan, irbesartan, losartan, olmesartan, telmisartan, or valsartan  celecoxib  lithium  NSAIDS, medicines for pain and inflammation, like ibuprofen or naproxen  potassium-sparing diuretics like amiloride, spironolactone, and triamterene  potassium supplements This list may not describe all possible interactions. Give your health care provider a  list of all the medicines, herbs, non-prescription drugs, or dietary supplements you use. Also tell them if you smoke, drink alcohol, or use illegal drugs. Some items may interact with your medicine. What should I watch for while using this medicine? Tell your doctor or health care provider if your symptoms do not start to get better or if they get worse. Do not become pregnant while taking this medicine. Women should inform their health care provider if they wish to become pregnant or think they might be pregnant. There is a potential for serious harm to an unborn child. Talk to your health care provider for more information. You may get drowsy or dizzy. Do not drive, use machinery, or do anything that needs mental alertness until you know how this medicine affects you. Do not stand or sit up quickly, especially if you are an older patient. This reduces the risk of dizzy or fainting spells. Alcohol may interfere with the effects of this medicine. Avoid alcoholic drinks. Avoid salt substitutes unless you are told otherwise by your health care provider. What side effects may I notice from receiving this medicine? Side effects that you should report to your doctor or health care provider as soon as possible:  allergic reactions (skin rash, itching or hives; swelling of the face, lips, or tongue)  high potassium levels (chest pain; fast, irregular heartbeat; muscle weakness)  kidney injury (trouble passing urine or change in the amount of urine)  low blood pressure (dizziness; feeling faint or lightheaded, falls; unusually weak or tired) Side effects that usually do not require medical attention (report to your doctor or health care provider if they continue or are bothersome):  cough This list may not describe all possible side effects. Call your doctor for medical advice about side effects. You may report side effects to FDA at 1-800-FDA-1088. Where should I keep my medicine? Keep out of the  reach of children and pets. Store at room temperature between 20 and 25 degrees C (68 and 77 degrees F). Protect from moisture. Keep the container tightly closed. Get rid of any unused medicine after the expiration date. To get rid of medicines that are no longer needed or have expired:  Take the medicine to a take-back program. Check with your pharmacy or law enforcement to find a location.  If you cannot return the medicine, check the label or package insert to see if the medicine should be thrown out in the garbage or flushed down the toilet. If you are not sure, ask your health care provider. If it is safe to put it in the trash, empty the medicine out of the container. Mix the medicine with cat litter, dirt, coffee grounds, or other unwanted substance. Seal the mixture in a bag or container. Put it in the trash. NOTE: This sheet is a summary. It may not cover all possible information. If you have questions about this medicine, talk to your doctor, pharmacist, or health care provider.  2021 Elsevier/Gold Standard (2019-06-24 11:23:32)   Dapagliflozin tablets What is this medicine? DAPAGLIFLOZIN (DAP a gli FLOE zin) controls blood sugar in people with diabetes. It is used with lifestyle changes like diet and exercise. It also treats heart failure and kidney disease. It may lower the risk for treatment of heart  failure in the hospital or worsened kidney disease. This medicine may be used for other purposes; ask your health care provider or pharmacist if you have questions. COMMON BRAND NAME(S): Wilder Glade What should I tell my health care provider before I take this medicine? They need to know if you have any of these conditions:  dehydration  diabetic ketoacidosis  diet low in salt  eating less due to illness, surgery, dieting, or any other reason  having surgery  history of pancreatitis or pancreas problems  history of yeast infection of the penis or vagina  if you often drink  alcohol  infection in the bladder, kidneys, or urinary tract  kidney disease  low blood pressure  on dialysis  problems urinating  type 1 diabetes  uncircumcised male  an unusual or allergic reaction to dapagliflozin, other medicines, foods, dyes, or preservatives  pregnant or trying to get pregnant  breast-feeding How should I use this medicine? Take this medicine by mouth with water. Take it as directed on the prescription label at the same time every day. You can take it with or without food. If it upsets your stomach, take it with food. Keep taking it unless your health care provider tells you to stop. A special MedGuide will be given to you by the pharmacist with each prescription and refill. Be sure to read this information carefully each time. Talk to your health care provider about the use of this medicine in children. Special care may be needed. Overdosage: If you think you have taken too much of this medicine contact a poison control center or emergency room at once. NOTE: This medicine is only for you. Do not share this medicine with others. What if I miss a dose? If you miss a dose, take it as soon as you can. If it is almost time for your next dose, take only that dose. Do not take double or extra doses. What may interact with this medicine? Interactions are not expected. This list may not describe all possible interactions. Give your health care provider a list of all the medicines, herbs, non-prescription drugs, or dietary supplements you use. Also tell them if you smoke, drink alcohol, or use illegal drugs. Some items may interact with your medicine. What should I watch for while using this medicine? Visit your health care provider for regular checks on your progress. Tell your health care provider if your symptoms do not start to get better or if they get worse. This medicine can cause a serious condition in which there is too much acid in the blood. If you develop  nausea, vomiting, stomach pain, unusual tiredness, or breathing problems, stop taking this medicine and call your doctor right away. If possible, use a ketone dipstick to check for ketones in your urine. Check with your health care provider if you have severe diarrhea, nausea, and vomiting, or if you sweat a lot. The loss of too much body fluid may make it dangerous for you to take this medicine. A test called the HbA1C (A1C) will be monitored. This is a simple blood test. It measures your blood sugar control over the last 2 to 3 months. You will receive this test every 3 to 6 months. Learn how to check your blood sugar. Learn the symptoms of low and high blood sugar and how to manage them. Always carry a quick-source of sugar with you in case you have symptoms of low blood sugar. Examples include hard sugar candy or glucose tablets. Make  sure others know that you can choke if you eat or drink when you develop serious symptoms of low blood sugar, such as seizures or unconsciousness. Get medical help at once. Tell your health care provider if you have high blood sugar. You might need to change the dose of your medicine. If you are sick or exercising more than usual, you may need to change the dose of your medicine. Do not skip meals. Ask your health care provider if you should avoid alcohol. Many nonprescription cough and cold products contain sugar or alcohol. These can affect blood sugar. Wear a medical ID bracelet or chain. Carry a card that describes your condition. List the medicines and doses you take on the card. What side effects may I notice from receiving this medicine? Side effects that you should report to your doctor or health care professional as soon as possible:  allergic reactions (skin rash, itching or hives, swelling of the face, lips, or tongue)  breathing problems  dizziness  feeling faint or lightheaded, falls  genital infection (fever; tenderness, redness, or swelling in the  genitals or area from the genitals to the back of the rectum)  kidney injury (trouble passing urine or change in the amount of urine)  low blood sugar (feeling anxious; confusion; dizziness; increased hunger; unusually weak or tired; increased sweating; shakiness; cold, clammy skin; irritable; headache; blurred vision; fast heartbeat; loss of consciousness)  muscle weakness  nausea, vomiting, unusual stomach upset or pain  new pain or tenderness, change in skin color, sores or ulcers, or infection in legs or feet  penile discharge, itching, or pain  unusual tiredness  unusual vaginal discharge, itching, or odor  urinary tract infection (fever; chills; a burning feeling when urinating; urgent need to urinate more often; blood in the urine; back pain) Side effects that usually do not require medical attention (report to your doctor or health care professional if they continue or are bothersome):  mild increase in urination  thirsty This list may not describe all possible side effects. Call your doctor for medical advice about side effects. You may report side effects to FDA at 1-800-FDA-1088. Where should I keep my medicine? Keep out of the reach of children and pets. Store at room temperature between 20 and 25 degrees C (68 and 77 degrees F). Get rid of any unused medicine after the expiration date. To get rid of medicines that are no longer needed or have expired:  Take the medicine to a medicine take-back program. Check with your pharmacy or law enforcement to find a location.  If you cannot return the medicine, check the label or package insert to see if the medicine should be thrown out in the garbage or flushed down the toilet. If you are not sure, ask your health care provider. If it is safe to put it in the trash, take the medicine out of the container. Mix the medicine with cat litter, dirt, coffee grounds, or other unwanted substance. Seal the mixture in a bag or container.  Put it in the trash. NOTE: This sheet is a summary. It may not cover all possible information. If you have questions about this medicine, talk to your doctor, pharmacist, or health care provider.  2021 Elsevier/Gold Standard (2019-09-25 13:18:47)

## 2020-07-29 NOTE — Progress Notes (Signed)
Benay Pike, MD: Primary Cardiologist:  Previously Dr Linde Gillis ([facility]) Primary EP:  Previous Dr Dwana Curd ([facility])  [name] Hall is a 70 y.o. male with a h/o ischemic CM/ chronic systolic dysfunction sp ICD implantation [facility]) by Dr Dwana Curd at [facility] for primary prevention of sudden death who presents today to establish care in the Electrophysiology device clinic.   He has episodes of NSVT.  Doing reasonably well.  He had CABG in 2007.  He had acute STEMI 07/07/10 with occluded LAD beyond LIMA as well as high grade LCx stenosis for which he received DES.  EF 25%.  The patient reports doing very well since having a pacemaker implanted and remains very active despite his age.   Today, he  denies symptoms of palpitations, chest pain, shortness of breath, orthopnea, PND, lower extremity edema, dizziness, presyncope, syncope, or neurologic sequela.  The patientis tolerating medications without difficulties and is otherwise without complaint today.   Past Medical History:  Diagnosis Date  . CAD (coronary artery disease)   . CHF (congestive heart failure) ([place])   . Complication of anesthesia    slow to wake after a surgery years ago ; but no issues with subsequent surgeries   . Fibromyalgia   . HLD (hyperlipidemia)   . Hx of CABG   . Hypertension    denies   . Ischemic cardiomyopathy   . 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 Myocardial infarction ([place])    2012    . Sleep apnea    no CPAP in use    Past Surgical History:  Procedure Laterality Date  . CARDIAC CATHETERIZATION  2012   X2 with 2 stents   . CORONARY ARTERY BYPASS GRAFT  2007   quadruple   . ICD IMPLANT  2012  . partial discectomy   1995  . SPINAL FUSION  2003   lumbar   . TOTAL HIP ARTHROPLASTY Left 02/15/2018   Procedure: LEFT TOTAL HIP ARTHROPLASTY ANTERIOR APPROACH;  Surgeon: Mcarthur Rossetti, MD;  Location: [facility];  Service: Orthopedics;  Laterality: Left;    Social History    Socioeconomic History  . Marital status: Married    Spouse name: Not on file  . Number of children: Not on file  . Years of education: Not on file  . Highest education level: Not on file  Occupational History  . Not on file  Tobacco Use  . Smoking status: Never Smoker  . Smokeless tobacco: Never Used  Substance and Sexual Activity  . Alcohol use: Yes    Comment: occ  . Drug use: Not on file  . Sexual activity: Not on file  Other Topics Concern  . Not on file  Social History Narrative  . Not on file   Social Determinants of Health   Financial Resource Strain: Not on file  Food Insecurity: Not on file  Transportation Needs: Not on file  Physical Activity: Not on file  Stress: Not on file  Social Connections: Not on file  Intimate Partner Violence: Not on file    Family History  Problem Relation Age of Onset  . Breast cancer Mother   . Prostate cancer Father     Allergies  Allergen Reactions  . Morphine And Related     Nausea  . Other     VEGAN ( no dairy , no meat, no fish, no eggs);   Marland Kitchen Statins     Muscle pain    Current Outpatient  Medications  Medication Sig Dispense Refill  . aspirin EC 81 MG tablet Take 81 mg by mouth daily.     . Cholecalciferol (VITAMIN D3) 2000 units TABS Take 2,000 Units by mouth daily. As needed    . Cyanocobalamin (VITAMIN B-12 PO) Take 2,500 mcg by mouth every other day. As needed    . ELIQUIS 5 MG TABS tablet Take 5 mg by mouth 2 (two) times daily.     Marland Kitchen lisinopril (PRINIVIL,ZESTRIL) 5 MG tablet Take 5 mg by mouth daily.     . metoprolol succinate (TOPROL-XL) 25 MG 24 hr tablet Take 75 mg by mouth daily.    . Multiple Vitamin (MULTI-VITAMINS) TABS Take 1 tablet by mouth daily.     . tadalafil (CIALIS) 5 MG tablet Take 5 mg by mouth daily.      No current facility-administered medications for this visit.    ROS- all systems are reviewed and negative except as per HPI  Physical Exam: Vitals:   07/29/20 1537  BP: 122/64   Pulse: (!) 58  SpO2: 98%  Weight: 190 lb 12.8 oz (86.5 kg)  Height: 5\' 10"  (1.778 m)    GEN- The patient is well appearing, alert and oriented x 3 today.   Head- normocephalic, atraumatic Eyes-  Sclera clear, conjunctiva pink Ears- hearing intact Oropharynx- clear Neck- supple,  Lungs- Clear to ausculation bilaterally, normal work of breathing Chest- pacemaker pocket is well healed Heart- Regular rate and rhythm, no murmurs, rubs or gallops, PMI not laterally displaced GI- soft, NT, ND, + BS Extremities- no clubbing, cyanosis, or edema MS- no significant deformity or atrophy Skin- no rash or lesion Psych- euthymic mood, full affect Neuro- strength and sensation are intact  ICD interrogation- reviewed in detail today,  See PACEART report  EKG ordered today shows sinus rhythm 59 bpm, PR 172 msec, QRS 124 msec, Qtc 400 msec.  Echo 11/09/17- EF 25% with AK of the inferolateral and apical walls, layered apical thrombus, mild diastolic dysfunction, moderate LVE, mild LVH  Assessment and Plan:  1. Ischemic CM/ CAD NYHA Class II currently  Doing well EF 25% by echo 2019 Would consider medicine optimization with entresto and farxiga.  We discussed at length today.  He would like to think about these further and then start on follow-up to establish with Cardiology. Normal ICD function Will request that remotes come to our office No changes today  2. LV thrombus He is on eliquis for this No history of AF per patient  3. HL Has not previously tolerated statins Would consider Psk9 inhibitor.  He has been reluctant to medical therapy previously  4. HTN Stable No change required today

## 2020-08-03 NOTE — Addendum Note (Signed)
Addended by: Rose Phi on: 08/03/2020 11:46 AM   Modules accepted: Orders

## 2020-08-06 ENCOUNTER — Inpatient Hospital Stay: Payer: PPO | Attending: Hematology and Oncology

## 2020-08-06 ENCOUNTER — Other Ambulatory Visit: Payer: Self-pay

## 2020-08-06 DIAGNOSIS — Z803 Family history of malignant neoplasm of breast: Secondary | ICD-10-CM | POA: Insufficient documentation

## 2020-08-06 DIAGNOSIS — I255 Ischemic cardiomyopathy: Secondary | ICD-10-CM | POA: Diagnosis not present

## 2020-08-06 DIAGNOSIS — D696 Thrombocytopenia, unspecified: Secondary | ICD-10-CM

## 2020-08-06 DIAGNOSIS — I2510 Atherosclerotic heart disease of native coronary artery without angina pectoris: Secondary | ICD-10-CM | POA: Diagnosis not present

## 2020-08-06 DIAGNOSIS — I509 Heart failure, unspecified: Secondary | ICD-10-CM | POA: Insufficient documentation

## 2020-08-06 DIAGNOSIS — I110 Hypertensive heart disease with heart failure: Secondary | ICD-10-CM | POA: Insufficient documentation

## 2020-08-06 DIAGNOSIS — D72819 Decreased white blood cell count, unspecified: Secondary | ICD-10-CM | POA: Diagnosis not present

## 2020-08-06 DIAGNOSIS — Z8042 Family history of malignant neoplasm of prostate: Secondary | ICD-10-CM | POA: Insufficient documentation

## 2020-08-06 LAB — CBC WITH DIFFERENTIAL/PLATELET
Abs Immature Granulocytes: 0 10*3/uL (ref 0.00–0.07)
Basophils Absolute: 0 10*3/uL (ref 0.0–0.1)
Basophils Relative: 1 %
Eosinophils Absolute: 0.1 10*3/uL (ref 0.0–0.5)
Eosinophils Relative: 4 %
HCT: 41.2 % (ref 39.0–52.0)
Hemoglobin: 13.7 g/dL (ref 13.0–17.0)
Immature Granulocytes: 0 %
Lymphocytes Relative: 21 %
Lymphs Abs: 0.8 10*3/uL (ref 0.7–4.0)
MCH: 30.6 pg (ref 26.0–34.0)
MCHC: 33.3 g/dL (ref 30.0–36.0)
MCV: 92 fL (ref 80.0–100.0)
Monocytes Absolute: 0.4 10*3/uL (ref 0.1–1.0)
Monocytes Relative: 11 %
Neutro Abs: 2.5 10*3/uL (ref 1.7–7.7)
Neutrophils Relative %: 63 %
Platelets: 115 10*3/uL — ABNORMAL LOW (ref 150–400)
RBC: 4.48 MIL/uL (ref 4.22–5.81)
RDW: 13.8 % (ref 11.5–15.5)
WBC: 3.9 10*3/uL — ABNORMAL LOW (ref 4.0–10.5)
nRBC: 0 % (ref 0.0–0.2)

## 2020-09-01 ENCOUNTER — Ambulatory Visit: Payer: PPO | Admitting: Cardiovascular Disease

## 2020-09-03 ENCOUNTER — Ambulatory Visit: Payer: PPO | Admitting: Cardiology

## 2020-09-03 ENCOUNTER — Telehealth: Payer: Self-pay | Admitting: Hematology and Oncology

## 2020-09-03 ENCOUNTER — Other Ambulatory Visit: Payer: Self-pay

## 2020-09-03 ENCOUNTER — Inpatient Hospital Stay: Payer: PPO | Attending: Hematology and Oncology

## 2020-09-03 ENCOUNTER — Encounter: Payer: Self-pay | Admitting: Hematology and Oncology

## 2020-09-03 ENCOUNTER — Inpatient Hospital Stay (HOSPITAL_BASED_OUTPATIENT_CLINIC_OR_DEPARTMENT_OTHER): Payer: PPO | Admitting: Hematology and Oncology

## 2020-09-03 VITALS — BP 117/68 | HR 53 | Temp 98.6°F | Resp 17 | Ht 70.0 in | Wt 188.6 lb

## 2020-09-03 DIAGNOSIS — D696 Thrombocytopenia, unspecified: Secondary | ICD-10-CM

## 2020-09-03 DIAGNOSIS — D72819 Decreased white blood cell count, unspecified: Secondary | ICD-10-CM

## 2020-09-03 DIAGNOSIS — Z803 Family history of malignant neoplasm of breast: Secondary | ICD-10-CM | POA: Insufficient documentation

## 2020-09-03 DIAGNOSIS — Z951 Presence of aortocoronary bypass graft: Secondary | ICD-10-CM | POA: Insufficient documentation

## 2020-09-03 DIAGNOSIS — I2510 Atherosclerotic heart disease of native coronary artery without angina pectoris: Secondary | ICD-10-CM | POA: Diagnosis not present

## 2020-09-03 DIAGNOSIS — Z7901 Long term (current) use of anticoagulants: Secondary | ICD-10-CM | POA: Insufficient documentation

## 2020-09-03 DIAGNOSIS — Z8042 Family history of malignant neoplasm of prostate: Secondary | ICD-10-CM | POA: Insufficient documentation

## 2020-09-03 DIAGNOSIS — Z96642 Presence of left artificial hip joint: Secondary | ICD-10-CM | POA: Diagnosis not present

## 2020-09-03 DIAGNOSIS — Z7982 Long term (current) use of aspirin: Secondary | ICD-10-CM | POA: Diagnosis not present

## 2020-09-03 DIAGNOSIS — I252 Old myocardial infarction: Secondary | ICD-10-CM | POA: Insufficient documentation

## 2020-09-03 DIAGNOSIS — I10 Essential (primary) hypertension: Secondary | ICD-10-CM | POA: Diagnosis not present

## 2020-09-03 LAB — CBC WITH DIFFERENTIAL/PLATELET
Abs Immature Granulocytes: 0.01 10*3/uL (ref 0.00–0.07)
Basophils Absolute: 0 10*3/uL (ref 0.0–0.1)
Basophils Relative: 1 %
Eosinophils Absolute: 0.2 10*3/uL (ref 0.0–0.5)
Eosinophils Relative: 4 %
HCT: 43.4 % (ref 39.0–52.0)
Hemoglobin: 14.2 g/dL (ref 13.0–17.0)
Immature Granulocytes: 0 %
Lymphocytes Relative: 17 %
Lymphs Abs: 0.7 10*3/uL (ref 0.7–4.0)
MCH: 30.2 pg (ref 26.0–34.0)
MCHC: 32.7 g/dL (ref 30.0–36.0)
MCV: 92.3 fL (ref 80.0–100.0)
Monocytes Absolute: 0.5 10*3/uL (ref 0.1–1.0)
Monocytes Relative: 11 %
Neutro Abs: 2.8 10*3/uL (ref 1.7–7.7)
Neutrophils Relative %: 67 %
Platelets: 128 10*3/uL — ABNORMAL LOW (ref 150–400)
RBC: 4.7 MIL/uL (ref 4.22–5.81)
RDW: 13.5 % (ref 11.5–15.5)
WBC: 4.2 10*3/uL (ref 4.0–10.5)
nRBC: 0 % (ref 0.0–0.2)

## 2020-09-03 NOTE — Progress Notes (Signed)
[facility] CONSULT NOTE  Patient Care Team: Benay Pike, MD as PCP - General (Hematology and Oncology)  CHIEF COMPLAINTS/PURPOSE OF CONSULTATION:  Cytopenia of unknown significance.  ASSESSMENT & PLAN:  No problem-specific Assessment & Plan notes found for this encounter.  No orders of the defined types were placed in this encounter.  This is a very pleasant 70 year old male patient with past medical history significant for coronary artery disease, congestive heart failure and ischemic cardiomyopathy referred to hematology for evaluation of leukopenia and thrombocytopenia.  I reviewed his L38, folic acid, hepatitis C, TSH labs which were unremarkable Iron, ANA, hemolysis labs normal. At this time we discussed that possible causes of thrombocytopenia are medications, bone marrow disorders or chronic ITP Since his platelet count is almost 130 K, I have recommended that he can continue surveillance. I dont think his fatigue is necessarily related to thrombocytopenia, it could very well be from his heart disease. He is also hoping to avoid procedures. He will RTC in 4 months with repeat labs.  Thank you for consulting [place] in the care of this patient.  Please do not hesitate to contact us with any additional questions or concerns  HISTORY OF PRESENTING ILLNESS:   [name] Hall 70 y.o. male is here because of Thrombocytopenia  This is a very pleasant 70 year old male patient with past medical history significant for coronary artery disease, congestive heart failure, hypertension, thrombocytopenia referred to hematology for thrombocytopenia.  During his initial visit, we have recommended further evaluation and he is here to review lab results and to discuss any additional recommendations.  Interval history He continues to do well except for fatigue which according to the patient is much better than what it was 2 decades ago.  He denies any bleeding complaints.  He is  trying to find a cardiologist locally, he was previously seen at [facility].  He also had some questions regarding medication such as aspirin, Eliquis and the role of thrombocytopenia.  Overall he is doing reasonably well.  REVIEW OF SYSTEMS:   Constitutional: Denies fevers, chills or abnormal night sweats Eyes: Denies blurriness of vision, double vision or watery eyes Ears, nose, mouth, throat, and face: Denies mucositis or sore throat Respiratory: Denies cough, dyspnea or wheezes Cardiovascular: Denies palpitation, chest discomfort or lower extremity swelling Gastrointestinal:  Denies nausea, heartburn or change in bowel habits Skin: Denies abnormal skin rashes Lymphatics: Denies new lymphadenopathy or easy bruising Neurological:Denies numbness, tingling or new weaknesses Behavioral/Psych: Mood is stable, no new changes  All other systems were reviewed with the patient and are negative.  MEDICAL HISTORY:  Past Medical History:  Diagnosis Date  . CAD (coronary artery disease)   . CHF (congestive heart failure) ([place])   . Complication of anesthesia    slow to wake after a surgery years ago ; but no issues with subsequent surgeries   . Fibromyalgia   . HLD (hyperlipidemia)   . Hx of CABG   . Hypertension    denies   . Ischemic cardiomyopathy   . 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 Myocardial infarction ([place])    2012    . Sleep apnea    no CPAP in use     SURGICAL HISTORY: Past Surgical History:  Procedure Laterality Date  . CARDIAC CATHETERIZATION  2012   X2 with 2 stents   . CORONARY ARTERY BYPASS GRAFT  2007   quadruple   . ICD IMPLANT  2012  . partial discectomy  1995  . SPINAL FUSION  2003   lumbar   . TOTAL HIP ARTHROPLASTY Left 02/15/2018   Procedure: LEFT TOTAL HIP ARTHROPLASTY ANTERIOR APPROACH;  Surgeon: Mcarthur Rossetti, MD;  Location: [facility];  Service: Orthopedics;  Laterality: Left;    SOCIAL HISTORY: Social History   Socioeconomic  History  . Marital status: Married    Spouse name: Not on file  . Number of children: Not on file  . Years of education: Not on file  . Highest education level: Not on file  Occupational History  . Not on file  Tobacco Use  . Smoking status: Never Smoker  . Smokeless tobacco: Never Used  Substance and Sexual Activity  . Alcohol use: Yes    Comment: occ  . Drug use: Not on file  . Sexual activity: Not on file  Other Topics Concern  . Not on file  Social History Narrative  . Not on file   Social Determinants of Health   Financial Resource Strain: Not on file  Food Insecurity: Not on file  Transportation Needs: Not on file  Physical Activity: Not on file  Stress: Not on file  Social Connections: Not on file  Intimate Partner Violence: Not on file    FAMILY HISTORY: Family History  Problem Relation Age of Onset  . Breast cancer Mother   . Prostate cancer Father     ALLERGIES:  is allergic to morphine and related, other, and statins.  MEDICATIONS:  Current Outpatient Medications  Medication Sig Dispense Refill  . aspirin EC 81 MG tablet Take 81 mg by mouth daily.  (Patient not taking: Reported on 09/03/2020)    . Cholecalciferol (VITAMIN D3) 2000 units TABS Take 2,000 Units by mouth daily. As needed    . Cyanocobalamin (VITAMIN B-12 PO) Take 2,500 mcg by mouth every other day. As needed    . ELIQUIS 5 MG TABS tablet Take 5 mg by mouth 2 (two) times daily.     Marland Kitchen lisinopril (PRINIVIL,ZESTRIL) 5 MG tablet Take 5 mg by mouth daily.     . metoprolol succinate (TOPROL-XL) 25 MG 24 hr tablet Take 75 mg by mouth daily.    . Multiple Vitamin (MULTI-VITAMINS) TABS Take 1 tablet by mouth daily.     . tadalafil (CIALIS) 5 MG tablet Take 5 mg by mouth daily.      No current facility-administered medications for this visit.     PHYSICAL EXAMINATION:  ECOG PERFORMANCE STATUS: 0 - Asymptomatic  Vitals:   09/03/20 0949  BP: 117/68  Pulse: (!) 53  Resp: 17  Temp: 98.6 F (37  C)  SpO2: 100%   Filed Weights   09/03/20 0949  Weight: 188 lb 9.6 oz (85.5 kg)   GENERAL:alert, no distress and comfortable SKIN: skin color, texture, turgor are normal, faint petechial-like rash noted on the lower extremities, likely chronic. EYES: normal, conjunctiva are pink and non-injected, sclera clear OROPHARYNX:no exudate, no erythema and lips, buccal mucosa, and tongue normal  NECK: supple, thyroid normal size, non-tender, without nodularity LYMPH:  no palpable lymphadenopathy in the cervical, axillary or inguinal LUNGS: clear to auscultation and percussion with normal breathing effort, defibrillator noted on the chest. HEART: regular rate & rhythm and no murmurs and no lower extremity edema ABDOMEN:abdomen soft, non-tender and normal bowel sounds.  No hepatosplenomegaly. Musculoskeletal:no cyanosis of digits and no clubbing  PSYCH: alert & oriented x 3 with fluent speech NEURO: no focal motor/sensory deficits  LABORATORY DATA:  I have reviewed the  data as listed Lab Results  Component Value Date   WBC 4.2 09/03/2020   HGB 14.2 09/03/2020   HCT 43.4 09/03/2020   MCV 92.3 09/03/2020   PLT 128 (L) 09/03/2020     Chemistry      Component Value Date/Time   NA 136 07/02/2020 1232   K 4.9 07/02/2020 1232   CL 105 07/02/2020 1232   CO2 24 07/02/2020 1232   BUN 9 07/02/2020 1232   CREATININE 0.95 07/02/2020 1232      Component Value Date/Time   CALCIUM 9.0 07/02/2020 1232   ALKPHOS 107 07/02/2020 1232   AST 25 07/02/2020 1232   ALT 11 07/02/2020 1232   BILITOT 0.6 07/02/2020 1232     TSH of 1.8 B12 of 573 Folate of 18 WBC of 3.7 Hep C ab negative. Multiple labs reviewed CBC ranging from 3700 to 4100, platelets mildly low in 120 K 2 yr ago, mild thrombocytopenia, but WBC Count was normal 9 yrs ago, again thrombocytopenia, plt count in 140K, no leukopenia Iron panel and ferritin normal. No evidence of hemolysis  RADIOGRAPHIC STUDIES: I have personally  reviewed the radiological images as listed and agreed with the findings in the report. No results found.  All questions were answered. The patient knows to call the clinic with any problems, questions or concerns. I spent 20 minutes in the care of this patient including H and P, review of records, counseling and coordination of care.     Benay Pike, MD 09/03/2020 9:56 AM

## 2020-09-03 NOTE — Telephone Encounter (Signed)
Scheduled follow-up appointment per 5/5 los. Patient is aware. ?

## 2020-09-04 ENCOUNTER — Telehealth: Payer: Self-pay

## 2020-09-04 NOTE — Telephone Encounter (Signed)
[name] Hall, JASOH, REFERRAL SENT TO [facility]

## 2020-09-29 ENCOUNTER — Other Ambulatory Visit: Payer: Self-pay

## 2020-09-29 ENCOUNTER — Encounter: Payer: Self-pay | Admitting: Cardiology

## 2020-09-29 ENCOUNTER — Ambulatory Visit: Payer: PPO | Admitting: Cardiology

## 2020-09-29 VITALS — BP 110/58 | HR 52 | Ht 70.0 in | Wt 189.2 lb

## 2020-09-29 DIAGNOSIS — E7800 Pure hypercholesterolemia, unspecified: Secondary | ICD-10-CM | POA: Diagnosis not present

## 2020-09-29 DIAGNOSIS — I513 Intracardiac thrombosis, not elsewhere classified: Secondary | ICD-10-CM | POA: Diagnosis not present

## 2020-09-29 DIAGNOSIS — Z951 Presence of aortocoronary bypass graft: Secondary | ICD-10-CM

## 2020-09-29 DIAGNOSIS — Z789 Other specified health status: Secondary | ICD-10-CM

## 2020-09-29 DIAGNOSIS — I2510 Atherosclerotic heart disease of native coronary artery without angina pectoris: Secondary | ICD-10-CM

## 2020-09-29 DIAGNOSIS — I255 Ischemic cardiomyopathy: Secondary | ICD-10-CM | POA: Diagnosis not present

## 2020-09-29 NOTE — Progress Notes (Signed)
Cardiology Office Note:    Date:  09/29/2020   ID:  [name], DOB [birth year], MRN [id number]  PCP:  Benay Pike, MD  Cardiologist:  Buford Dresser, MD  Referring MD: Benay Pike, MD   Chief Complaint  Patient presents with  . New Patient (Initial Visit)  CC: New evaluation and management of Ischemic cardiomyopathy  History of Present Illness:    [name] Hall is a 70 y.o. male with a hx of CAD, CHF, fibromyalgia, HLD with statin intolerance, sleep apnea, Myocardial Infarction (2012) s/p CABG x4, ischemic cardiomyopathy who is seen as a new consult at the request of [name], [name], MD for the evaluation and management of Ischemic cardiomyopathy.  Today: Exercise level: Walks a golf course/mows the lawn without exertional symptoms Current diet: Vegan diet for 10 years.  His main concern is the possibility of thrombosis and the worry that his heart may just give out. He reports his baseline is a heart rate in the 50s-60s and generally lower blood pressure. Last week, he had a minor episode of central chest pain that "felt off" and partly reminded him of the thrombosis in 2012. The pain lasted for a good part of a day. However, he felt it was mild, had no other symptoms, and managed with rest.   Also, he notes getting short of breath more easily lately, but he is still able to walk a golf course and mow the lawn. He mostly attributes this shortness of breath and decreased energy to normal aging.   In 2019, an Echo found an LV thrombus and he was placed on Eliquis. He has remained on anticoagulation since that time and has not had a repeat echo. He is vegan, and coumadin would be very difficult for him.  We spent extensive time today discussing guideline recommended medical therapy for CAD/hypercholesterolemia as well as cardiomyopathy. Previously he has been intolerant of statins. We discussed at length non-statin medication options, focusing on PCSK9i. We also  discussed heart failure medications. He also did not tolerate spironolactone due to elevated potassium side effect. His blood pressure runs low, and he is only on 5 mg lisinopril daily. We discussed entresto as well as SGLT2i at length. His concern, in addition to low blood pressure, is cost of medications as he is on a fixed income.   He denies any chest pain, shortness of breath, palpitations, or exertional symptoms. No headaches, lightheadedness, or syncope to report. Also has no lower extremity edema, orthopnea or PND. He does not have hematuria or blood in his stool.    Past Medical History:  Diagnosis Date  . CAD (coronary artery disease)   . CHF (congestive heart failure) ([place])   . Complication of anesthesia    slow to wake after a surgery years ago ; but no issues with subsequent surgeries   . Fibromyalgia   . HLD (hyperlipidemia)   . Hx of CABG   . Hypertension    denies   . Ischemic cardiomyopathy   . 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 Myocardial infarction ([name])    2012    . Sleep apnea    no CPAP in use     Past Surgical History:  Procedure Laterality Date  . CARDIAC CATHETERIZATION  2012   X2 with 2 stents   . CORONARY ARTERY BYPASS GRAFT  2007   quadruple   . ICD IMPLANT  2012  . partial discectomy   1995  . SPINAL FUSION  2003   lumbar   . TOTAL HIP ARTHROPLASTY Left 02/15/2018   Procedure: LEFT TOTAL HIP ARTHROPLASTY ANTERIOR APPROACH;  Surgeon: Mcarthur Rossetti, MD;  Location: [facility];  Service: Orthopedics;  Laterality: Left;    Current Medications: Current Outpatient Medications on File Prior to Visit  Medication Sig  . aspirin EC 81 MG tablet Take 81 mg by mouth daily.  . Cholecalciferol (VITAMIN D3) 2000 units TABS Take 2,000 Units by mouth daily. As needed  . Cyanocobalamin (VITAMIN B-12 PO) Take 2,500 mcg by mouth every other day. As needed  . ELIQUIS 5 MG TABS tablet Take 5 mg by mouth 2 (two) times daily.   Marland Kitchen lisinopril  (PRINIVIL,ZESTRIL) 5 MG tablet Take 5 mg by mouth daily.   . metoprolol succinate (TOPROL-XL) 25 MG 24 hr tablet Take 75 mg by mouth daily.  . Multiple Vitamin (MULTI-VITAMINS) TABS Take 1 tablet by mouth daily.   . tadalafil (CIALIS) 5 MG tablet Take 5 mg by mouth daily.    No current facility-administered medications on file prior to visit.     Allergies:   Morphine and related, Other, and Statins   Social History   Tobacco Use  . Smoking status: Never Smoker  . Smokeless tobacco: Never Used  Substance Use Topics  . Alcohol use: Yes    Comment: occ    Family History: family history includes Breast cancer in his mother; Prostate cancer in his father.  ROS:   Please see the history of present illness.  Additional pertinent ROS: Constitutional: Positive for fatigue. Negative for chills, fever, night sweats, unintentional weight loss  HENT: Negative for ear pain and hearing loss.   Eyes: Negative for loss of vision and eye pain.  Respiratory: Positive for shortness of breath. Negative for cough, sputum, wheezing.   Cardiovascular: See HPI. Gastrointestinal: Negative for abdominal pain, melena, and hematochezia.  Genitourinary: Negative for dysuria and hematuria.  Musculoskeletal: Negative for falls and myalgias.  Skin: Negative for itching and rash.  Neurological: Negative for focal weakness, focal sensory changes and loss of consciousness.  Endo/Heme/Allergies: Does not bruise/bleed easily.     EKGs/Labs/Other Studies Reviewed:    The following studies were reviewed today:  Echo TTE 11/09/2017: - Left ventricle: The cavity size was moderately dilated. Wall  thickness was increased in a pattern of mild LVH. Systolic  function was severely reduced. The estimated ejection fraction  was in the range of 25% to 30%. Diffuse hypokinesis. There is  akinesis of the inferolateral and apical myocardium. Doppler  parameters are consistent with abnormal left ventricular   relaxation (grade 1 diastolic dysfunction).   Impressions:   - Akinesis of the inferolateral and apical walls; overall severe LV  dysfunction; layered apical thrombus noted using definity; mild  diastolic dysfunction; moderate LVE; mild LVH; results discussed  with Derrill Kay MD.   EKG:  EKG is personally reviewed.   09/29/2020: Sinus bradycardia at 52 bpm, IVCD, low voltage limb leads, lateral TWI  Recent Labs: 07/02/2020: ALT 11; BUN 9; Creatinine 0.95; Potassium 4.9; Sodium 136 09/03/2020: Hemoglobin 14.2; Platelets 128  Recent Lipid Panel    Component Value Date/Time   CHOL  07/07/2010 1416    170        ATP III CLASSIFICATION:  <200     mg/dL   Desirable  200-239  mg/dL   Borderline High  >=240    mg/dL   High          TRIG 177 (H) 07/07/2010  1416   HDL 31 (L) 07/07/2010 1416   CHOLHDL 5.5 07/07/2010 1416   VLDL 35 07/07/2010 1416   LDLCALC (H) 07/07/2010 1416    104        Total Cholesterol/HDL:CHD Risk Coronary Heart Disease Risk Table                     Men   Women  1/2 Average Risk   3.4   3.3  Average Risk       5.0   4.4  2 X Average Risk   9.6   7.1  3 X Average Risk  23.4   11.0        Use the calculated Patient Ratio above and the CHD Risk Table to determine the patient's CHD Risk.        ATP III CLASSIFICATION (LDL):  <100     mg/dL   Optimal  100-129  mg/dL   Near or Above                    Optimal  130-159  mg/dL   Borderline  160-189  mg/dL   High  >190     mg/dL   Very High   LDLDIRECT 92.3 11/01/2006 0914    Physical Exam:    VS:  BP (!) 110/58 (BP Location: Left Arm, Patient Position: Sitting, Cuff Size: Normal)   Pulse (!) 52   Ht 5\' 10"  (1.778 m)   Wt 189 lb 3.2 oz (85.8 kg)   BMI 27.15 kg/m     Wt Readings from Last 3 Encounters:  09/29/20 189 lb 3.2 oz (85.8 kg)  09/03/20 188 lb 9.6 oz (85.5 kg)  07/29/20 190 lb 12.8 oz (86.5 kg)    GEN: Well nourished, well developed in no acute distress HEENT: Normal, moist  mucous membranes NECK: No JVD CARDIAC: regular rhythm, normal S1 and S2, no rubs or gallops. No murmur. VASCULAR: Radial and DP pulses 2+ bilaterally. No carotid bruits RESPIRATORY:  Clear to auscultation without rales, wheezing or rhonchi  ABDOMEN: Soft, non-tender, non-distended MUSCULOSKELETAL:  Ambulates independently SKIN: Warm and dry, no edema NEUROLOGIC:  Alert and oriented x 3. No focal neuro deficits noted. PSYCHIATRIC:  Normal affect    ASSESSMENT:    1. Coronary artery disease involving native coronary artery of native heart without angina pectoris   2. LV (left ventricular) mural thrombus   3. Ischemic cardiomyopathy   4. Hx of CABG   5. Pure hypercholesterolemia   6. Statin intolerance    PLAN:    CAD with prior CABG Hypercholesterolemia, LDL goal <70, with statin intolerance Ischemic cardiomyopathy -previously followed at [facility], notes reviewed. -one event last week of uncertain etiology, but no routine angina -on both apixaban and aspirin. See below on apixaban. Would continue aspirin -has not tolerated multiple statins. Declines to retrial. Is on a vegan diet, but LDL 110 despite this. With his CAD and statin intolerance, recommend PCSK9i. Discussed today. Cost is a main concern. Will ask our PharmD team to meet him and work with him to see if we can make one of these affordable for him -NYHA class I-II (doesn't push himself but doesn't feel limited) -on metoprolol succinate 50 mg in the AM and 25 in the PM. Has ICD in place. ECG reviewed today -on lisinopril 5 mg daily, borderline low BP at baseline. Did not tolerate spironolactone in the past due to hyperkalemia. We discussed Entresto today. I am concerned about  his blood pressure, given that he barely tolerates very low dose lisinopril -we also discussed SGLT2i. Given his borderline BP, we will start [place] today, given samples. If he tolerates, would continue farxiga 10 mg daily. If he does not, then may not have  room for additional GDMT  History of LV thrombus 2019 -has never had recheck echo. Will order -discussed that if no thrombus on this echo, can hold anticoagulation and recheck again in 3 mos -discussed that there is more data for coumadin than DOAC in LV thrombus, but with his vegan diet, coumadin would severely limit his diet  Cardiac risk counseling and prevention recommendations: -recommend heart healthy/Mediterranean diet, with whole grains, fruits, vegetable, fish, lean meats, nuts, and olive oil. Limit salt. -recommend moderate walking, 3-5 times/week for 30-50 minutes each session. Aim for at least 150 minutes.week. Goal should be pace of 3 miles/hours, or walking 1.5 miles in 30 minutes -recommend avoidance of tobacco products. Avoid excess alcohol.   Plan for follow up: 3 months or sooner as needed.  Buford Dresser, MD, PhD, [facility]    Medication Adjustments/Labs and Tests Ordered: Current medicines are reviewed at length with the patient today.  Concerns regarding medicines are outlined above.  Orders Placed This Encounter  Procedures  . EKG 12-Lead  . ECHOCARDIOGRAM COMPLETE   No orders of the defined types were placed in this encounter.   Patient Instructions  Medication Instructions:  Start taking Farxiga 10 mg daily (samples provided).   *If you need a refill on your cardiac medications before your next appointment, please call your pharmacy*   Lab Work: None ordered today   Testing/Procedures: Your physician has requested that you have an echocardiogram. Echocardiography is a painless test that uses sound waves to create images of your heart. It provides your doctor with information about the size and shape of your heart and how well your heart's chambers and valves are working. This procedure takes approximately one hour. There are no restrictions for this procedure. [place] [address]    Follow-Up: At [facility], you and your health needs are our priority.  As part of our continuing mission to provide you with exceptional heart care, we have created designated Provider Care Teams.  These Care Teams include your primary Cardiologist (physician) and Advanced Practice Providers (APPs -  Physician Assistants and Nurse Practitioners) who all work together to provide you with the care you need, when you need it.  We recommend signing up for the patient portal called "MyChart".  Sign up information is provided on this After Visit Summary.  MyChart is used to connect with patients for Virtual Visits (Telemedicine).  Patients are able to view lab/test results, encounter notes, upcoming appointments, etc.  Non-urgent messages can be sent to your provider as well.   To learn more about what you can do with MyChart, go to [email or web address].    Your next appointment:   3 month(s)  The format for your next appointment:   In Person  Provider:   Buford Dresser, MD  Your physician recommends that you schedule a follow-up appointment with Pharm D.      Evolocumab injection What is this medicine? EVOLOCUMAB (e voe LOK ue mab) treats high cholesterol. It is used with lifestyle changes, like diet and exercise. It is used alone or with other medicines. This medicine may be used for other purposes; ask your health care provider or pharmacist if you have questions. COMMON  BRAND NAME(S): Repatha What should I tell my health care provider before I take this medicine? They need to know if you have any of these conditions:  an unusual or allergic reaction to evolocumab, other medicines, latex, foods, dyes, or preservatives  pregnant or trying to get pregnant  breast-feeding How should I use this medicine? This medicine is injected under the skin. You will be taught how to prepare and give it. Take it as directed on the prescription label at the same time every day. Keep taking it unless your  health care provider tells you to stop. It is important that you put your used needles and syringes in a special sharps container. Do not put them in a trash can. If you do not have a sharps container, call your pharmacist or health care provider to get one. This medicine comes with INSTRUCTIONS FOR USE. Ask your pharmacist for directions on how to use this medicine. Read the information carefully. Talk to your pharmacist or health care provider if you have questions. Talk to your health care provider about the use of this medicine in children. While it may be prescribed for children as young as 10 for selected conditions, precautions do apply. Overdosage: If you think you have taken too much of this medicine contact a poison control center or emergency room at once. NOTE: This medicine is only for you. Do not share this medicine with others. What if I miss a dose? It is important not to miss any doses. Talk to your health care provider about what to do if you miss a dose. What may interact with this medicine? Interactions are not expected. This list may not describe all possible interactions. Give your health care provider a list of all the medicines, herbs, non-prescription drugs, or dietary supplements you use. Also tell them if you smoke, drink alcohol, or use illegal drugs. Some items may interact with your medicine. What should I watch for while using this medicine? Visit your health care provider for regular checks on your progress. Tell your health care provider if your symptoms do not start to get better or if they get worse. You may need blood work while you are taking this drug. Do not wear the on-body infuser during an MRI. Taking this drug is only part of a total heart healthy program. Your health care provider may give you a special diet to follow. Avoid alcohol. Avoid smoking. Ask your health care provider how much you should exercise. What side effects may I notice from receiving this  medicine? Side effects that you should report to your doctor or health care provider as soon as possible:  allergic reactions (skin rash, itching or hives; swelling of the face, lips, or tongue)  high blood sugar (increased hunger, thirst, or urination; unusually weak or tired, blurry vision)  infection (fever, chills, cough, sore throat, pain, or trouble passing urine) Side effects that usually do not require medical attention (report to your doctor or health care provider if they continue or are bothersome):  diarrhea  muscle pain  nausea  pain, redness, or irritation at site where injected This list may not describe all possible side effects. Call your doctor for medical advice about side effects. You may report side effects to FDA at 1-800-FDA-1088. Where should I keep my medicine? Keep out of the reach of children and pets. Store in a refrigerator or at room temperature between 20 and 25 degrees C (68 and 77 degrees F). Refrigeration (preferred): Store it  in the refrigerator. Do not freeze. Keep it in the original carton until you are ready to take it. Remove the dose from the carton about 30 minutes before it is time for you to take it. Throw away any unused medicine after the expiration date. Room temperature: This medicine may be stored at room temperature for up to 30 days. Keep it in the original carton until you are ready to take it. If it is stored at room temperature, throw away any unused medicine after 30 days or after it expires, whichever is first. Protect from light. Do not shake. Avoid exposure to extreme heat. To get rid of medicines that are no longer needed or have expired:  Take the medicine to a medicine take-back program. Check with your pharmacy or law enforcement to find a location.  If you cannot return the medicine, ask your pharmacist or health care provider how to get rid of this medicine safely. NOTE: This sheet is a summary. It may not cover all possible  information. If you have questions about this medicine, talk to your doctor, pharmacist, or health care provider.  2021 Elsevier/Gold Standard (2019-04-10 12:26:14) Dapagliflozin tablets What is this medicine? DAPAGLIFLOZIN (DAP a gli FLOE zin) controls blood sugar in people with diabetes. It is used with lifestyle changes like diet and exercise. It also treats heart failure and kidney disease. It may lower the risk for treatment of heart failure in the hospital or worsened kidney disease. This medicine may be used for other purposes; ask your health care provider or pharmacist if you have questions. COMMON BRAND NAME(S): Wilder Glade What should I tell my health care provider before I take this medicine? They need to know if you have any of these conditions:  dehydration  diabetic ketoacidosis  diet low in salt  eating less due to illness, surgery, dieting, or any other reason  having surgery  history of pancreatitis or pancreas problems  history of yeast infection of the penis or vagina  if you often drink alcohol  infection in the bladder, kidneys, or urinary tract  kidney disease  low blood pressure  on dialysis  problems urinating  type 1 diabetes  uncircumcised male  an unusual or allergic reaction to dapagliflozin, other medicines, foods, dyes, or preservatives  pregnant or trying to get pregnant  breast-feeding How should I use this medicine? Take this medicine by mouth with water. Take it as directed on the prescription label at the same time every day. You can take it with or without food. If it upsets your stomach, take it with food. Keep taking it unless your health care provider tells you to stop. A special MedGuide will be given to you by the pharmacist with each prescription and refill. Be sure to read this information carefully each time. Talk to your health care provider about the use of this medicine in children. Special care may be needed. Overdosage:  If you think you have taken too much of this medicine contact a poison control center or emergency room at once. NOTE: This medicine is only for you. Do not share this medicine with others. What if I miss a dose? If you miss a dose, take it as soon as you can. If it is almost time for your next dose, take only that dose. Do not take double or extra doses. What may interact with this medicine? Interactions are not expected. This list may not describe all possible interactions. Give your health care provider a list of  all the medicines, herbs, non-prescription drugs, or dietary supplements you use. Also tell them if you smoke, drink alcohol, or use illegal drugs. Some items may interact with your medicine. What should I watch for while using this medicine? Visit your health care provider for regular checks on your progress. Tell your health care provider if your symptoms do not start to get better or if they get worse. This medicine can cause a serious condition in which there is too much acid in the blood. If you develop nausea, vomiting, stomach pain, unusual tiredness, or breathing problems, stop taking this medicine and call your doctor right away. If possible, use a ketone dipstick to check for ketones in your urine. Check with your health care provider if you have severe diarrhea, nausea, and vomiting, or if you sweat a lot. The loss of too much body fluid may make it dangerous for you to take this medicine. A test called the HbA1C (A1C) will be monitored. This is a simple blood test. It measures your blood sugar control over the last 2 to 3 months. You will receive this test every 3 to 6 months. Learn how to check your blood sugar. Learn the symptoms of low and high blood sugar and how to manage them. Always carry a quick-source of sugar with you in case you have symptoms of low blood sugar. Examples include hard sugar candy or glucose tablets. Make sure others know that you can choke if you eat or  drink when you develop serious symptoms of low blood sugar, such as seizures or unconsciousness. Get medical help at once. Tell your health care provider if you have high blood sugar. You might need to change the dose of your medicine. If you are sick or exercising more than usual, you may need to change the dose of your medicine. Do not skip meals. Ask your health care provider if you should avoid alcohol. Many nonprescription cough and cold products contain sugar or alcohol. These can affect blood sugar. Wear a medical ID bracelet or chain. Carry a card that describes your condition. List the medicines and doses you take on the card. What side effects may I notice from receiving this medicine? Side effects that you should report to your doctor or health care professional as soon as possible:  allergic reactions (skin rash, itching or hives, swelling of the face, lips, or tongue)  breathing problems  dizziness  feeling faint or lightheaded, falls  genital infection (fever; tenderness, redness, or swelling in the genitals or area from the genitals to the back of the rectum)  kidney injury (trouble passing urine or change in the amount of urine)  low blood sugar (feeling anxious; confusion; dizziness; increased hunger; unusually weak or tired; increased sweating; shakiness; cold, clammy skin; irritable; headache; blurred vision; fast heartbeat; loss of consciousness)  muscle weakness  nausea, vomiting, unusual stomach upset or pain  new pain or tenderness, change in skin color, sores or ulcers, or infection in legs or feet  penile discharge, itching, or pain  unusual tiredness  unusual vaginal discharge, itching, or odor  urinary tract infection (fever; chills; a burning feeling when urinating; urgent need to urinate more often; blood in the urine; back pain) Side effects that usually do not require medical attention (report to your doctor or health care professional if they continue  or are bothersome):  mild increase in urination  thirsty This list may not describe all possible side effects. Call your doctor for medical advice about  side effects. You may report side effects to FDA at 1-800-FDA-1088. Where should I keep my medicine? Keep out of the reach of children and pets. Store at room temperature between 20 and 25 degrees C (68 and 77 degrees F). Get rid of any unused medicine after the expiration date. To get rid of medicines that are no longer needed or have expired:  Take the medicine to a medicine take-back program. Check with your pharmacy or law enforcement to find a location.  If you cannot return the medicine, check the label or package insert to see if the medicine should be thrown out in the garbage or flushed down the toilet. If you are not sure, ask your health care provider. If it is safe to put it in the trash, take the medicine out of the container. Mix the medicine with cat litter, dirt, coffee grounds, or other unwanted substance. Seal the mixture in a bag or container. Put it in the trash. NOTE: This sheet is a summary. It may not cover all possible information. If you have questions about this medicine, talk to your doctor, pharmacist, or health care provider.  2021 Elsevier/Gold Standard (2019-09-25 13:18:47)     I,Mathew Stumpf,acting as a scribe for [employer], MD.,have documented all relevant documentation on the behalf of Buford Dresser, MD,as directed by  Buford Dresser, MD while in the presence of Buford Dresser, MD.  I, Buford Dresser, MD, have reviewed all documentation for this visit. The documentation on 09/29/20 for the exam, diagnosis, procedures, and orders are all accurate and complete.  Signed, Buford Dresser, MD PhD 09/29/2020 1:18 PM    [facility]

## 2020-09-29 NOTE — Patient Instructions (Addendum)
Medication Instructions:  Start taking Farxiga 10 mg daily (samples provided).   *If you need a refill on your cardiac medications before your next appointment, please call your pharmacy*   Lab Work: None ordered today   Testing/Procedures: Your physician has requested that you have an echocardiogram. Echocardiography is a painless test that uses sound waves to create images of your heart. It provides your doctor with information about the size and shape of your heart and how well your heart's chambers and valves are working. This procedure takes approximately one hour. There are no restrictions for this procedure. [address]    Follow-Up: At [facility], you and your health needs are our priority.  As part of our continuing mission to provide you with exceptional heart care, we have created designated Provider Care Teams.  These Care Teams include your primary Cardiologist (physician) and Advanced Practice Providers (APPs -  Physician Assistants and Nurse Practitioners) who all work together to provide you with the care you need, when you need it.  We recommend signing up for the patient portal called "MyChart".  Sign up information is provided on this After Visit Summary.  MyChart is used to connect with patients for Virtual Visits (Telemedicine).  Patients are able to view lab/test results, encounter notes, upcoming appointments, etc.  Non-urgent messages can be sent to your provider as well.   To learn more about what you can do with MyChart, go to [email or web address].    Your next appointment:   3 month(s)  The format for your next appointment:   In Person  Provider:   Buford Dresser, MD  Your physician recommends that you schedule a follow-up appointment with Pharm D.      Evolocumab injection What is this medicine? EVOLOCUMAB (e voe LOK ue mab) treats high cholesterol. It is used with lifestyle changes, like diet and exercise. It is  used alone or with other medicines. This medicine may be used for other purposes; ask your health care provider or pharmacist if you have questions. COMMON BRAND NAME(S): Repatha What should I tell my health care provider before I take this medicine? They need to know if you have any of these conditions:  an unusual or allergic reaction to evolocumab, other medicines, latex, foods, dyes, or preservatives  pregnant or trying to get pregnant  breast-feeding How should I use this medicine? This medicine is injected under the skin. You will be taught how to prepare and give it. Take it as directed on the prescription label at the same time every day. Keep taking it unless your health care provider tells you to stop. It is important that you put your used needles and syringes in a special sharps container. Do not put them in a trash can. If you do not have a sharps container, call your pharmacist or health care provider to get one. This medicine comes with INSTRUCTIONS FOR USE. Ask your pharmacist for directions on how to use this medicine. Read the information carefully. Talk to your pharmacist or health care provider if you have questions. Talk to your health care provider about the use of this medicine in children. While it may be prescribed for children as young as 10 for selected conditions, precautions do apply. Overdosage: If you think you have taken too much of this medicine contact a poison control center or emergency room at once. NOTE: This medicine is only for you. Do not share this medicine with others. What if I miss  a dose? It is important not to miss any doses. Talk to your health care provider about what to do if you miss a dose. What may interact with this medicine? Interactions are not expected. This list may not describe all possible interactions. Give your health care provider a list of all the medicines, herbs, non-prescription drugs, or dietary supplements you use. Also tell  them if you smoke, drink alcohol, or use illegal drugs. Some items may interact with your medicine. What should I watch for while using this medicine? Visit your health care provider for regular checks on your progress. Tell your health care provider if your symptoms do not start to get better or if they get worse. You may need blood work while you are taking this drug. Do not wear the on-body infuser during an MRI. Taking this drug is only part of a total heart healthy program. Your health care provider may give you a special diet to follow. Avoid alcohol. Avoid smoking. Ask your health care provider how much you should exercise. What side effects may I notice from receiving this medicine? Side effects that you should report to your doctor or health care provider as soon as possible:  allergic reactions (skin rash, itching or hives; swelling of the face, lips, or tongue)  high blood sugar (increased hunger, thirst, or urination; unusually weak or tired, blurry vision)  infection (fever, chills, cough, sore throat, pain, or trouble passing urine) Side effects that usually do not require medical attention (report to your doctor or health care provider if they continue or are bothersome):  diarrhea  muscle pain  nausea  pain, redness, or irritation at site where injected This list may not describe all possible side effects. Call your doctor for medical advice about side effects. You may report side effects to FDA at 1-800-FDA-1088. Where should I keep my medicine? Keep out of the reach of children and pets. Store in a refrigerator or at room temperature between 20 and 25 degrees C (68 and 77 degrees F). Refrigeration (preferred): Store it in the refrigerator. Do not freeze. Keep it in the original carton until you are ready to take it. Remove the dose from the carton about 30 minutes before it is time for you to take it. Throw away any unused medicine after the expiration date. Room  temperature: This medicine may be stored at room temperature for up to 30 days. Keep it in the original carton until you are ready to take it. If it is stored at room temperature, throw away any unused medicine after 30 days or after it expires, whichever is first. Protect from light. Do not shake. Avoid exposure to extreme heat. To get rid of medicines that are no longer needed or have expired:  Take the medicine to a medicine take-back program. Check with your pharmacy or law enforcement to find a location.  If you cannot return the medicine, ask your pharmacist or health care provider how to get rid of this medicine safely. NOTE: This sheet is a summary. It may not cover all possible information. If you have questions about this medicine, talk to your doctor, pharmacist, or health care provider.  2021 Elsevier/Gold Standard (2019-04-10 12:26:14) Dapagliflozin tablets What is this medicine? DAPAGLIFLOZIN (DAP a gli FLOE zin) controls blood sugar in people with diabetes. It is used with lifestyle changes like diet and exercise. It also treats heart failure and kidney disease. It may lower the risk for treatment of heart failure in the hospital  or worsened kidney disease. This medicine may be used for other purposes; ask your health care provider or pharmacist if you have questions. COMMON BRAND NAME(S): Wilder Glade What should I tell my health care provider before I take this medicine? They need to know if you have any of these conditions:  dehydration  diabetic ketoacidosis  diet low in salt  eating less due to illness, surgery, dieting, or any other reason  having surgery  history of pancreatitis or pancreas problems  history of yeast infection of the penis or vagina  if you often drink alcohol  infection in the bladder, kidneys, or urinary tract  kidney disease  low blood pressure  on dialysis  problems urinating  type 1 diabetes  uncircumcised male  an unusual or  allergic reaction to dapagliflozin, other medicines, foods, dyes, or preservatives  pregnant or trying to get pregnant  breast-feeding How should I use this medicine? Take this medicine by mouth with water. Take it as directed on the prescription label at the same time every day. You can take it with or without food. If it upsets your stomach, take it with food. Keep taking it unless your health care provider tells you to stop. A special MedGuide will be given to you by the pharmacist with each prescription and refill. Be sure to read this information carefully each time. Talk to your health care provider about the use of this medicine in children. Special care may be needed. Overdosage: If you think you have taken too much of this medicine contact a poison control center or emergency room at once. NOTE: This medicine is only for you. Do not share this medicine with others. What if I miss a dose? If you miss a dose, take it as soon as you can. If it is almost time for your next dose, take only that dose. Do not take double or extra doses. What may interact with this medicine? Interactions are not expected. This list may not describe all possible interactions. Give your health care provider a list of all the medicines, herbs, non-prescription drugs, or dietary supplements you use. Also tell them if you smoke, drink alcohol, or use illegal drugs. Some items may interact with your medicine. What should I watch for while using this medicine? Visit your health care provider for regular checks on your progress. Tell your health care provider if your symptoms do not start to get better or if they get worse. This medicine can cause a serious condition in which there is too much acid in the blood. If you develop nausea, vomiting, stomach pain, unusual tiredness, or breathing problems, stop taking this medicine and call your doctor right away. If possible, use a ketone dipstick to check for ketones in your  urine. Check with your health care provider if you have severe diarrhea, nausea, and vomiting, or if you sweat a lot. The loss of too much body fluid may make it dangerous for you to take this medicine. A test called the HbA1C (A1C) will be monitored. This is a simple blood test. It measures your blood sugar control over the last 2 to 3 months. You will receive this test every 3 to 6 months. Learn how to check your blood sugar. Learn the symptoms of low and high blood sugar and how to manage them. Always carry a quick-source of sugar with you in case you have symptoms of low blood sugar. Examples include hard sugar candy or glucose tablets. Make sure others know that  you can choke if you eat or drink when you develop serious symptoms of low blood sugar, such as seizures or unconsciousness. Get medical help at once. Tell your health care provider if you have high blood sugar. You might need to change the dose of your medicine. If you are sick or exercising more than usual, you may need to change the dose of your medicine. Do not skip meals. Ask your health care provider if you should avoid alcohol. Many nonprescription cough and cold products contain sugar or alcohol. These can affect blood sugar. Wear a medical ID bracelet or chain. Carry a card that describes your condition. List the medicines and doses you take on the card. What side effects may I notice from receiving this medicine? Side effects that you should report to your doctor or health care professional as soon as possible:  allergic reactions (skin rash, itching or hives, swelling of the face, lips, or tongue)  breathing problems  dizziness  feeling faint or lightheaded, falls  genital infection (fever; tenderness, redness, or swelling in the genitals or area from the genitals to the back of the rectum)  kidney injury (trouble passing urine or change in the amount of urine)  low blood sugar (feeling anxious; confusion; dizziness;  increased hunger; unusually weak or tired; increased sweating; shakiness; cold, clammy skin; irritable; headache; blurred vision; fast heartbeat; loss of consciousness)  muscle weakness  nausea, vomiting, unusual stomach upset or pain  new pain or tenderness, change in skin color, sores or ulcers, or infection in legs or feet  penile discharge, itching, or pain  unusual tiredness  unusual vaginal discharge, itching, or odor  urinary tract infection (fever; chills; a burning feeling when urinating; urgent need to urinate more often; blood in the urine; back pain) Side effects that usually do not require medical attention (report to your doctor or health care professional if they continue or are bothersome):  mild increase in urination  thirsty This list may not describe all possible side effects. Call your doctor for medical advice about side effects. You may report side effects to FDA at 1-800-FDA-1088. Where should I keep my medicine? Keep out of the reach of children and pets. Store at room temperature between 20 and 25 degrees C (68 and 77 degrees F). Get rid of any unused medicine after the expiration date. To get rid of medicines that are no longer needed or have expired:  Take the medicine to a medicine take-back program. Check with your pharmacy or law enforcement to find a location.  If you cannot return the medicine, check the label or package insert to see if the medicine should be thrown out in the garbage or flushed down the toilet. If you are not sure, ask your health care provider. If it is safe to put it in the trash, take the medicine out of the container. Mix the medicine with cat litter, dirt, coffee grounds, or other unwanted substance. Seal the mixture in a bag or container. Put it in the trash. NOTE: This sheet is a summary. It may not cover all possible information. If you have questions about this medicine, talk to your doctor, pharmacist, or health care  provider.  2021 Elsevier/Gold Standard (2019-09-25 13:18:47)

## 2020-10-22 ENCOUNTER — Other Ambulatory Visit: Payer: Self-pay

## 2020-10-22 ENCOUNTER — Ambulatory Visit (HOSPITAL_COMMUNITY): Payer: PPO | Attending: Cardiology

## 2020-10-22 ENCOUNTER — Ambulatory Visit (INDEPENDENT_AMBULATORY_CARE_PROVIDER_SITE_OTHER): Payer: PPO | Admitting: Pharmacist Clinician (PhC)/ Clinical Pharmacy Specialist

## 2020-10-22 DIAGNOSIS — E7800 Pure hypercholesterolemia, unspecified: Secondary | ICD-10-CM

## 2020-10-22 DIAGNOSIS — I255 Ischemic cardiomyopathy: Secondary | ICD-10-CM | POA: Diagnosis not present

## 2020-10-22 DIAGNOSIS — I513 Intracardiac thrombosis, not elsewhere classified: Secondary | ICD-10-CM | POA: Diagnosis not present

## 2020-10-22 MED ORDER — PERFLUTREN LIPID MICROSPHERE
1.0000 mL | INTRAVENOUS | Status: AC | PRN
  Administered 2020-10-22: 1 mL via INTRAVENOUS

## 2020-10-22 NOTE — Progress Notes (Signed)
10/22/2020 [name] 1950/08/11 [id number]   HPI:  JAIQUAN TEMME is a 70 y.o. male patient of Dr Harrell Gave, who presents today for a lipid clinic evaluation.  See pertinent past medical history below.  He was seen by Dr. Harrell Gave in May as a referral for the management of ischemic cardiomyopathy.  She spent extensive time with him reviewing GDMT for both CAD and heart failure.   He is doing well with lisinopril and metoprolol.  Apparently tried spironolactone in the past but developed hyperkalemia.  When he saw her, he was given samples of [place].  Took x 1 week, but feels as though they gave him a brain fog.  He stopped and is feeling better.  He will try again next week with the second box of samples to see if the problem recurs.      Today we are discussing options for further lowering of his cholesterol.  He is unsure that it needs to be lower, feels that "too low" would not be good.  Explained that we have seen patients with LDL as low as single digits because of hereditary "flaws", and they do just fine, so we are confident that lowering his LDL is safe.    Past Medical History: ASCVD CABG x 4 - 2002, MI 2012   CHF HFrEF at 25-30% (10/2017) - on lisinopril 5, metoprolol succ 75, samples of Farxiga given at 5/31 appt   Current Medications: none  Cholesterol Goals: LDL < 70   Intolerant/previously tried: atorvastatin, rosuvastatin - both caused myalgias  Family history: father with heart disease, died of cancer; mother had no heart disease; sister with no health issues  Diet: vegan x 10 years  Exercise:  golf regularly, although uses cart  Labs: 2/22:  TC 168, HDL 35, LDL 110   Current Outpatient Medications  Medication Sig Dispense Refill   aspirin EC 81 MG tablet Take 81 mg by mouth daily.     Cholecalciferol (VITAMIN D3) 2000 units TABS Take 2,000 Units by mouth daily. As needed     Cyanocobalamin (VITAMIN B-12 PO) Take 2,500 mcg by mouth every other day. As  needed     ELIQUIS 5 MG TABS tablet Take 5 mg by mouth 2 (two) times daily.      lisinopril (PRINIVIL,ZESTRIL) 5 MG tablet Take 5 mg by mouth daily.      metoprolol succinate (TOPROL-XL) 25 MG 24 hr tablet Take 75 mg by mouth in the morning, at noon, and at bedtime.     Multiple Vitamin (MULTI-VITAMINS) TABS Take 1 tablet by mouth daily.      tadalafil (CIALIS) 5 MG tablet Take 5 mg by mouth daily.      No current facility-administered medications for this visit.    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 rate 14, height 5\' 10"  (1.778 m), weight 187 lb (84.8 kg), SpO2 97 %.   Pure hypercholesterolemia Patient with hyperlipidemia and ASCVD, LDL not to goal of < 70.   Reviewed options for lowering LDL cholesterol, including PCSK-9 inhibitors, bempedoic acid and inclisiran.  Did not review use of ezetimibe   Discussed mechanisms of action, dosing, side effects and potential decreases in LDL cholesterol.  Answered all patient questions.  Based on this information, patient would prefer to take the drug names and do his own research at home.  He was given my contact information, and he is to notify me once he makes a decision as to which therapy (if any) he would prefer to start.  Also gave  information regarding patient assistance programs to help pay for any of these medications.     Tommy Medal PharmD CPP [facility] [address] [place], [place] [address] [phone]

## 2020-10-22 NOTE — Patient Instructions (Signed)
Your Results:             Your most recent labs Goal  Total Cholesterol 168 < 200  Triglycerides 120 < 150  HDL (happy/good cholesterol) 35 > 40  LDL (lousy/bad cholesterol 110 < 70    Medication changes:  Options available are:    Repatha or Praluent (evolocumab, alirocumab)   Nexletol (bempedoic acid)   Leqvio (inclisiran)  If you decide you want to try any of the above, please reach out to me.  You can send a MyChart note or call.  Tommy Medal PharmD or [phone].     Patient Assistance:  The [employer] foundation offers assistance to help pay for medication copays.  They will cover copays for all cholesterol lowering meds, including statins, fibrates, omega-3 oils, ezetimibe, Repatha, Praluent, Nexletol, Nexlizet.  The cards are usually good for $2,500 or 12 months, whichever comes first. Go to healthwellfoundation.org Click on "Apply Now" Answer questions as to whom is applying (patient or representative) Your disease fund will be "hypercholesterolemia - Medicare access" They will ask questions about finances and which medications you are taking for cholesterol When you submit, the approval is usually within minutes.  You will need to print the card information from the site You will need to show this information to your pharmacy, they will bill your Medicare Part D plan first -then bill [employer] --for the copay.   You can also call them at [phone], although the hold times can be quite long.   Thank you for choosing [facility]

## 2020-10-22 NOTE — Assessment & Plan Note (Signed)
Patient with hyperlipidemia and ASCVD, LDL not to goal of < 70.   Reviewed options for lowering LDL cholesterol, including PCSK-9 inhibitors, bempedoic acid and inclisiran.  Did not review use of ezetimibe   Discussed mechanisms of action, dosing, side effects and potential decreases in LDL cholesterol.  Answered all patient questions.  Based on this information, patient would prefer to take the drug names and do his own research at home.  He was given my contact information, and he is to notify me once he makes a decision as to which therapy (if any) he would prefer to start.  Also gave information regarding patient assistance programs to help pay for any of these medications.

## 2020-10-23 LAB — ECHOCARDIOGRAM COMPLETE
Area-P 1/2: 1.87 cm2
Height: 70 in
S' Lateral: 5.2 cm
Weight: 2992 oz

## 2020-10-28 ENCOUNTER — Ambulatory Visit (INDEPENDENT_AMBULATORY_CARE_PROVIDER_SITE_OTHER): Payer: PPO

## 2020-10-28 DIAGNOSIS — I255 Ischemic cardiomyopathy: Secondary | ICD-10-CM | POA: Diagnosis not present

## 2020-10-28 LAB — CUP PACEART REMOTE DEVICE CHECK
Battery Remaining Longevity: 54 mo
Battery Remaining Percentage: 61 %
Brady Statistic RV Percent Paced: 1 %
Date Time Interrogation Session: 20220629065000
HighPow Impedance: 103 Ohm
Implantable Lead Implant Date: 20120913
Implantable Lead Location: 753860
Implantable Lead Model: 293
Implantable Lead Serial Number: 107958
Implantable Pulse Generator Implant Date: 20120913
Lead Channel Impedance Value: 442 Ohm
Lead Channel Pacing Threshold Amplitude: 0.8 V
Lead Channel Pacing Threshold Pulse Width: 0.5 ms
Lead Channel Setting Pacing Amplitude: 2 V
Lead Channel Setting Pacing Pulse Width: 0.5 ms
Lead Channel Setting Sensing Sensitivity: 0.6 mV
Pulse Gen Serial Number: 105130

## 2020-11-10 DIAGNOSIS — I5022 Chronic systolic (congestive) heart failure: Secondary | ICD-10-CM | POA: Diagnosis not present

## 2020-11-10 DIAGNOSIS — L299 Pruritus, unspecified: Secondary | ICD-10-CM | POA: Diagnosis not present

## 2020-11-10 DIAGNOSIS — I513 Intracardiac thrombosis, not elsewhere classified: Secondary | ICD-10-CM | POA: Diagnosis not present

## 2020-11-10 DIAGNOSIS — D696 Thrombocytopenia, unspecified: Secondary | ICD-10-CM | POA: Diagnosis not present

## 2020-11-10 DIAGNOSIS — N400 Enlarged prostate without lower urinary tract symptoms: Secondary | ICD-10-CM | POA: Diagnosis not present

## 2020-11-10 DIAGNOSIS — Z0000 Encounter for general adult medical examination without abnormal findings: Secondary | ICD-10-CM | POA: Diagnosis not present

## 2020-11-10 DIAGNOSIS — Z95810 Presence of automatic (implantable) cardiac defibrillator: Secondary | ICD-10-CM | POA: Diagnosis not present

## 2020-11-13 ENCOUNTER — Other Ambulatory Visit: Payer: Self-pay

## 2020-11-13 DIAGNOSIS — I513 Intracardiac thrombosis, not elsewhere classified: Secondary | ICD-10-CM

## 2020-11-17 NOTE — Progress Notes (Signed)
Remote ICD transmission.   

## 2020-12-03 ENCOUNTER — Ambulatory Visit: Payer: PPO | Admitting: Physician Assistant

## 2020-12-03 ENCOUNTER — Other Ambulatory Visit: Payer: Self-pay

## 2020-12-03 ENCOUNTER — Ambulatory Visit (INDEPENDENT_AMBULATORY_CARE_PROVIDER_SITE_OTHER): Payer: PPO

## 2020-12-03 ENCOUNTER — Encounter: Payer: Self-pay | Admitting: Physician Assistant

## 2020-12-03 DIAGNOSIS — M545 Low back pain, unspecified: Secondary | ICD-10-CM

## 2020-12-03 DIAGNOSIS — G8929 Other chronic pain: Secondary | ICD-10-CM

## 2020-12-03 MED ORDER — METHYLPREDNISOLONE 4 MG PO TABS: ORAL_TABLET | ORAL | 0 refills | Status: DC

## 2020-12-03 NOTE — Progress Notes (Signed)
l  Office Visit Note   Patient: [name] Hall           Date of Birth: April 19, 1951           MRN: [id number] Visit Date: 12/03/2020              Requested by: Benay Pike, MD [place],  [place] [id number] PCP: Benay Pike, MD   Assessment & Plan: Visit Diagnoses:  1. Chronic bilateral low back pain, unspecified whether sciatica present     Plan: We will place him on a Medrol Dosepak.  Send him to formal physical therapy prescriptions given.  The work on core strengthening, stretching, back exercises, modalities and home exercise program.  He will call us in 2 weeks and let us know if he is improving.  If he fails improvement may require CT myelogram of the lumbar spine given his implanted cardio defibrillator.  Questions encouraged and answered at length today.  Follow-Up Instructions: Return if symptoms worsen or fail to improve.   Orders:  Orders Placed This Encounter  Procedures   XR Lumbar Spine 2-3 Views   Meds ordered this encounter  Medications   methylPREDNISolone (MEDROL) 4 MG tablet    Sig: Take as directed    Dispense:  21 tablet    Refill:  0      Procedures: No procedures performed   Clinical Data: No additional findings.   Subjective: Chief Complaint  Patient presents with   Lower Back - Pain    HPI [name] Hall is a 70 year old male well-known to our department service comes in today due to low back pain for several months.  However her pain became worse recently to the point that he is having difficulty ambulating.  He does have a history of an L4-L5 fusion some years ago.  He denies any radicular pain.  However he does have some chronic right foot pain.  He has tried at least a muscle relaxer on 1 occasion since having this acute episode is unsure if it is helped.  He is also tried ibuprofen.  He has tried an inversion table.  He is having difficulty sleeping.  No bowel or bladder dysfunction.  No saddle anesthesia like symptoms.   States the pain is better after activities but if he is really active he has significant pain the next day and has difficulty even ambulating.  Mostly having pain lower back area that radiates into the abdominal area.  No shortness of breath chest pain.  Review of Systems Negative for fevers or chills.  Please see HPI otherwise negative or noncontributory.  Objective: Vital Signs: There were no vitals taken for this visit.  Physical Exam Constitutional:      Appearance: He is not ill-appearing or diaphoretic.  Cardiovascular:     Pulses: Normal pulses.  Pulmonary:     Effort: Pulmonary effort is normal.  Neurological:     Mental Status: He is alert and oriented to person, place, and time.  Psychiatric:        Mood and Affect: Mood normal.    Ortho Exam Lower extremities he has 5 5 strength throughout lower extremities against resistance.  Negative straight leg raise bilaterally.  Dorsal pedal pulses are intact bilaterally.  Sensation grossly intact bilateral feet.  Tight hamstrings bilaterally.  Tenderness left lower lumbar paraspinous region.  He does move slowly with slight kyphotic posture.  But is able to get on and off the exam table on his own.  Specialty Comments:  No specialty comments available.  Imaging: XR Lumbar Spine 2-3 Views  Result Date: 12/03/2020 Lumbar spine 2 views: No acute findings.  Prior L4-5 lumbar fusion with no hardware failure.  L5-S1 autofusion.  Degenerative disc disease with anterior endplates spurring at 624THL and L3-L4.     PMFS History: Patient Active Problem List   Diagnosis Date Noted   Hx of CABG 09/29/2020   Pure hypercholesterolemia 09/29/2020   Hyperlipidemia, unspecified 09/07/2018   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   Unilateral primary osteoarthritis, left hip 12/18/2017   Unilateral primary osteoarthritis, right hip 12/18/2017   Fitting or adjustment of automatic implantable cardioverter-defibrillator 06/07/2016    Hyperkalemia 04/16/2014   ICD (implantable cardioverter-defibrillator), single, in situ 03/14/2011   Coronary artery disease 03/07/2011   Essential hypertension 03/07/2011   Fibromyalgia 03/07/2011   Ischemic cardiomyopathy 03/07/2011   Left ventricular apical thrombus 03/07/2011   Low testosterone 03/07/2011   Obstructive sleep apnea on CPAP 03/07/2011   Past Medical History:  Diagnosis Date   CAD (coronary artery disease)    CHF (congestive heart failure) (HCC)    Complication of anesthesia    slow to wake after a surgery years ago ; but no issues with subsequent surgeries    Fibromyalgia    HLD (hyperlipidemia)    Hx of CABG    Hypertension    denies    Ischemic cardiomyopathy    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[place])    Myocardial infarction ([place])    2012     Sleep apnea    no CPAP in use     Family History  Problem Relation Age of Onset   Breast cancer Mother    Prostate cancer Father     Past Surgical History:  Procedure Laterality Date   CARDIAC CATHETERIZATION  2012   X2 with 2 stents    CORONARY ARTERY BYPASS GRAFT  2007   quadruple    ICD IMPLANT  2012   partial discectomy   [place]  2003   lumbar    TOTAL HIP ARTHROPLASTY Left 02/15/2018   Procedure: LEFT TOTAL HIP ARTHROPLASTY ANTERIOR APPROACH;  Surgeon: Mcarthur Rossetti, MD;  Location: [facility];  Service: Orthopedics;  Laterality: Left;   Social History   Occupational History   Not on file  Tobacco Use   Smoking status: Never   Smokeless tobacco: Never  Substance and Sexual Activity   Alcohol use: Yes    Comment: occ   Drug use: Not on file   Sexual activity: Not on file

## 2020-12-09 ENCOUNTER — Encounter: Payer: Self-pay | Admitting: Orthopaedic Surgery

## 2020-12-10 DIAGNOSIS — Z6827 Body mass index (BMI) 27.0-27.9, adult: Secondary | ICD-10-CM | POA: Diagnosis not present

## 2020-12-10 DIAGNOSIS — M5489 Other dorsalgia: Secondary | ICD-10-CM | POA: Diagnosis not present

## 2020-12-29 ENCOUNTER — Other Ambulatory Visit: Payer: Self-pay

## 2020-12-29 ENCOUNTER — Ambulatory Visit (HOSPITAL_BASED_OUTPATIENT_CLINIC_OR_DEPARTMENT_OTHER): Payer: PPO | Admitting: Cardiology

## 2020-12-29 ENCOUNTER — Encounter (HOSPITAL_BASED_OUTPATIENT_CLINIC_OR_DEPARTMENT_OTHER): Payer: Self-pay | Admitting: Cardiology

## 2020-12-29 VITALS — BP 100/68 | HR 54 | Ht 70.0 in | Wt 181.0 lb

## 2020-12-29 DIAGNOSIS — I519 Heart disease, unspecified: Secondary | ICD-10-CM

## 2020-12-29 DIAGNOSIS — E7800 Pure hypercholesterolemia, unspecified: Secondary | ICD-10-CM | POA: Diagnosis not present

## 2020-12-29 DIAGNOSIS — I255 Ischemic cardiomyopathy: Secondary | ICD-10-CM

## 2020-12-29 DIAGNOSIS — Z789 Other specified health status: Secondary | ICD-10-CM

## 2020-12-29 DIAGNOSIS — Z951 Presence of aortocoronary bypass graft: Secondary | ICD-10-CM

## 2020-12-29 DIAGNOSIS — I2510 Atherosclerotic heart disease of native coronary artery without angina pectoris: Secondary | ICD-10-CM

## 2020-12-29 NOTE — Progress Notes (Signed)
Cardiology Office Note:    Date:  12/29/2020   ID:  [name] Hall, DOB July 27, 1950, MRN [id number]  PCP:  Benay Pike, MD  Cardiologist:  Buford Dresser, MD  Referring MD: Benay Pike, MD   CC: follow up  History of Present Illness:    [name] Hall is a 70 y.o. male with a hx of CAD, CHF, fibromyalgia, HLD with statin intolerance, sleep apnea, Myocardial Infarction (2012) s/p CABG x4, ischemic cardiomyopathy who is seen as a new consult at the request of [name], [name], MD for the evaluation and management of Ischemic cardiomyopathy.  CV history/risk factors: Exercise level: Walks a golf course/mows the lawn without exertional symptoms Current diet: Vegan diet for 10 years. 2019: LV thrombus on echo, placed on apixaban. Did not have repeat study. We repeated in 09/2020, no thrombus, apixaban stopped. Repeat echo pending.   Today: Having issues with sharp back pain that wraps to his front with any movement. Has a history of back surgery, but this pain is different. No change in weakness/sensation.   Tried farxiga, had brain fog. Did not try again. Spent significant time discussing guidelines and options for management.  Saw Erasmo Downer PharmD, discussed non-statin options, he is interested but very worried about cost.  Did two lab, brings results today. 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 hsCRP 4.6. Discussed that given this, would recommend PCSK9i if it is affordable for him.   Denies chest pain, shortness of breath at rest or with normal exertion. No PND, orthopnea, LE edema or unexpected weight gain. No syncope or palpitations.   Past Medical History:  Diagnosis Date   CAD (coronary artery disease)    CHF (congestive heart failure) (HCC)    Complication of anesthesia    slow to wake after a surgery years ago ; but no issues with subsequent surgeries    Fibromyalgia    HLD (hyperlipidemia)    Hx of CABG    Hypertension    denies    Ischemic cardiomyopathy    Left ventricular  apical thrombus without MI ([name])    Myocardial infarction ([name])    2012     Sleep apnea    no CPAP in use     Past Surgical History:  Procedure Laterality Date   CARDIAC CATHETERIZATION  2012   X2 with 2 stents    CORONARY ARTERY BYPASS GRAFT  2007   quadruple    ICD IMPLANT  2012   partial discectomy   1995   SPINAL FUSION  2003   lumbar    TOTAL HIP ARTHROPLASTY Left 02/15/2018   Procedure: LEFT TOTAL HIP ARTHROPLASTY ANTERIOR APPROACH;  Surgeon: Mcarthur Rossetti, MD;  Location: [facility];  Service: Orthopedics;  Laterality: Left;    Current Medications: Current Outpatient Medications on File Prior to Visit  Medication Sig   aspirin EC 81 MG tablet Take 81 mg by mouth daily.   Cholecalciferol (VITAMIN D3) 2000 units TABS Take 2,000 Units by mouth daily. As needed   Cyanocobalamin (VITAMIN B-12 PO) Take 2,500 mcg by mouth every other day. As needed   lisinopril (PRINIVIL,ZESTRIL) 5 MG tablet Take 5 mg by mouth daily.    methylPREDNISolone (MEDROL) 4 MG tablet Take as directed   metoprolol succinate (TOPROL-XL) 25 MG 24 hr tablet Take 75 mg by mouth in the morning, at noon, and at bedtime.   Multiple Vitamin (MULTI-VITAMINS) TABS Take 1 tablet by mouth daily.    tadalafil (CIALIS) 5 MG tablet Take 5 mg by mouth daily.  No current facility-administered medications on file prior to visit.     Allergies:   Crestor [rosuvastatin], Lipitor [atorvastatin], Morphine and related, Other, and Statins   Social History   Tobacco Use   Smoking status: Never   Smokeless tobacco: Never  Substance Use Topics   Alcohol use: Yes    Comment: occ    Family History: family history includes Breast cancer in his mother; Prostate cancer in his father.  ROS:   Please see the history of present illness.  Additional pertinent ROS otherwise unremarkable.  EKGs/Labs/Other Studies Reviewed:    The following studies were reviewed today: Echo 10/23/2020 1. LV thrombus excluded by echo  contrast. Left ventricular ejection  fraction, by estimation, is 25 to 30%. The left ventricle has severely  decreased function. The left ventricle demonstrates regional wall motion  abnormalities (see scoring  diagram/findings for description). Left ventricular diastolic parameters  are indeterminate.   2. Right ventricular systolic function is normal. The right ventricular  size is normal.   3. The mitral valve is grossly normal. Trivial mitral valve  regurgitation. No evidence of mitral stenosis.   4. The aortic valve is tricuspid. There is mild calcification of the  aortic valve. Aortic valve regurgitation is not visualized. Mild aortic  valve sclerosis is present, with no evidence of aortic valve stenosis.   5. The inferior vena cava is normal in size with greater than 50%  respiratory variability, suggesting right atrial pressure of 3 mmHg.   Comparison(s): Prior images unable to be directly viewed, comparison made  by report only. 11/09/17 EF 25-30%.   Conclusion(s)/Recommendation(s): No evidence of LV thrombus with use of  echo contrast. EF similar to prior report.   Echo TTE 11/09/2017: - Left ventricle: The cavity size was moderately dilated. Wall    thickness was increased in a pattern of mild LVH. Systolic    function was severely reduced. The estimated ejection fraction    was in the range of 25% to 30%. Diffuse hypokinesis. There is    akinesis of the inferolateral and apical myocardium. Doppler    parameters are consistent with abnormal left ventricular    relaxation (grade 1 diastolic dysfunction).   Impressions:   - Akinesis of the inferolateral and apical walls; overall severe LV    dysfunction; layered apical thrombus noted using definity; mild    diastolic dysfunction; moderate LVE; mild LVH; results discussed    with Derrill Kay MD.   EKG:  EKG is personally reviewed.   09/29/2020: Sinus bradycardia at 52 bpm, IVCD, low voltage limb leads, lateral  TWI  Recent Labs: 07/02/2020: ALT 11; BUN 9; Creatinine 0.95; Potassium 4.9; Sodium 136 09/03/2020: Hemoglobin 14.2; Platelets 128  Recent Lipid Panel    Component Value Date/Time   CHOL  07/07/2010 1416    170        ATP III CLASSIFICATION:  <200     mg/dL   Desirable  200-239  mg/dL   Borderline High  >=240    mg/dL   High          TRIG 177 (H) 07/07/2010 1416   HDL 31 (L) 07/07/2010 1416   CHOLHDL 5.5 07/07/2010 1416   VLDL 35 07/07/2010 1416   LDLCALC (H) 07/07/2010 1416    104        Total Cholesterol/HDL:CHD Risk Coronary Heart Disease Risk Table                     Men  Women  1/2 Average Risk   3.4   3.3  Average Risk       5.0   4.4  2 X Average Risk   9.6   7.1  3 X Average Risk  23.4   11.0        Use the calculated Patient Ratio above and the CHD Risk Table to determine the patient's CHD Risk.        ATP III CLASSIFICATION (LDL):  <100     mg/dL   Optimal  100-129  mg/dL   Near or Above                    Optimal  130-159  mg/dL   Borderline  160-189  mg/dL   High  >190     mg/dL   Very High   LDLDIRECT 92.3 11/01/2006 0914    Physical Exam:    VS:  BP 100/68   Pulse (!) 54   Ht '5\' 10"'$  (1.778 m)   Wt 181 lb (82.1 kg)   SpO2 98%   BMI 25.97 kg/m     Wt Readings from Last 3 Encounters:  12/29/20 181 lb (82.1 kg)  10/22/20 187 lb (84.8 kg)  09/29/20 189 lb 3.2 oz (85.8 kg)    GEN: Well nourished, well developed in no acute distress HEENT: Normal, moist mucous membranes NECK: No JVD CARDIAC: regular rhythm, normal S1 and S2, no rubs or gallops. No murmur. VASCULAR: Radial and DP pulses 2+ bilaterally. No carotid bruits RESPIRATORY:  Clear to auscultation without rales, wheezing or rhonchi  ABDOMEN: Soft, non-tender, non-distended MUSCULOSKELETAL:  Ambulates independently SKIN: Warm and dry, no edema NEUROLOGIC:  Alert and oriented x 3. No focal neuro deficits noted. PSYCHIATRIC:  Normal affect    ASSESSMENT:    1. Ischemic cardiomyopathy    2. Coronary artery disease involving native coronary artery of native heart without angina pectoris   3. Hx of CABG   4. Pure hypercholesterolemia   5. Statin intolerance   6. Chronic systolic dysfunction of left ventricle     PLAN:    CAD with prior CABG Hypercholesterolemia, LDL goal <70, with statin intolerance Ischemic cardiomyopathy -continue aspirin -has not tolerated multiple statins. Saw pharmacy re: [id number] and other non-statin options. We discussed again today. With elevated lp(a) and CRP, would like him to be on PCSK9i. He is amenable if cost is not prohibitive. Will reach out to our pharmacy team. -NYHA class I-II (doesn't push himself but doesn't feel limited) -on metoprolol succinate 50 mg in the AM and 25 in the PM. Has ICD in place. -on lisinopril 5 mg daily, borderline low BP at baseline. Did not tolerate spironolactone in the past due to hyperkalemia. No blood pressure room for entresto -had brain fog on farxiga  History of LV thrombus 2019 -rechecked echo, no thrombus. Stopped anticoagulation, repeat echo ordered.  Cardiac risk counseling and prevention recommendations: -recommend heart healthy/Mediterranean diet, with whole grains, fruits, vegetable, fish, lean meats, nuts, and olive oil. Limit salt. -recommend moderate walking, 3-5 times/week for 30-50 minutes each session. Aim for at least 150 minutes.week. Goal should be pace of 3 miles/hours, or walking 1.5 miles in 30 minutes -recommend avoidance of tobacco products. Avoid excess alcohol.   Plan for follow up: 6 months or sooner as needed. Echo in late September  Buford Dresser, MD, PhD, [facility]    Medication Adjustments/Labs and Tests Ordered: Current medicines are reviewed at length  with the patient today.  Concerns regarding medicines are outlined above.  No orders of the defined types were placed in this encounter.  No orders of the defined types were placed in this  encounter.   Patient Instructions  Medication Instructions:  The current medical regimen is effective;  continue present plan and medications as directed. Please refer to the Current Medication list given to you today.  *If you need a refill on your cardiac medications before your next appointment, please call your pharmacy*  Lab Work: NONE  Testing/Procedures: Echocardiogram(SCHEDULE AFTER SEPT 23'ISH) - Your physician has requested that you have an echocardiogram. Echocardiography is a painless test that uses sound waves to create images of your heart. It provides your doctor with information about the size and shape of your heart and how well your heart's chambers and valves are working. This procedure takes approximately one hour. There are no restrictions for this procedure.   Follow-Up: Your next appointment:  6 month(s) In Person with Buford Dresser, MD   At [facility], you and your health needs are our priority.  As part of our continuing mission to provide you with exceptional heart care, we have created designated Provider Care Teams.  These Care Teams include your primary Cardiologist (physician) and Advanced Practice Providers (APPs -  Physician Assistants and Nurse Practitioners) who all work together to provide you with the care you need, when you need it.    Signed, Buford Dresser, MD PhD 12/29/2020 5:49 PM    [place]

## 2020-12-29 NOTE — Patient Instructions (Signed)
Medication Instructions:  The current medical regimen is effective;  continue present plan and medications as directed. Please refer to the Current Medication list given to you today.  *If you need a refill on your cardiac medications before your next appointment, please call your pharmacy*  Lab Work: NONE  Testing/Procedures: Echocardiogram(SCHEDULE AFTER SEPT 23'ISH) - Your physician has requested that you have an echocardiogram. Echocardiography is a painless test that uses sound waves to create images of your heart. It provides your doctor with information about the size and shape of your heart and how well your heart's chambers and valves are working. This procedure takes approximately one hour. There are no restrictions for this procedure.   Follow-Up: Your next appointment:  6 month(s) In Person with Buford Dresser, MD   At [facility], you and your health needs are our priority.  As part of our continuing mission to provide you with exceptional heart care, we have created designated Provider Care Teams.  These Care Teams include your primary Cardiologist (physician) and Advanced Practice Providers (APPs -  Physician Assistants and Nurse Practitioners) who all work together to provide you with the care you need, when you need it.

## 2020-12-30 ENCOUNTER — Telehealth: Payer: Self-pay | Admitting: Pharmacist Clinician (PhC)/ Clinical Pharmacy Specialist

## 2020-12-30 DIAGNOSIS — E785 Hyperlipidemia, unspecified: Secondary | ICD-10-CM

## 2020-12-30 NOTE — Telephone Encounter (Signed)
Pa sent for repatha to cmm Raahil Hoeger (Key: [id number])

## 2020-12-30 NOTE — Telephone Encounter (Signed)
Patient agreeable to starting Repatha [id number] mg Sureclick, [id number].  Saw Dr. Harrell Gave yesterday and discussed with her.    Will have [facility] start PA process and let patient know when approved.  He should repeat labs after 4-5 doses.  (He notes he may wait several weeks to start, depending on price, as he thinks he's in the coverage gap)

## 2020-12-31 MED ORDER — REPATHA SURECLICK 140 MG/ML ~~LOC~~ SOAJ: 140.0000 mg | SUBCUTANEOUS | 11 refills | Status: DC

## 2020-12-31 NOTE — Telephone Encounter (Signed)
Please order lipid panel in 2-3 months from starting medication

## 2020-12-31 NOTE — Telephone Encounter (Signed)
What labs?

## 2020-12-31 NOTE — Telephone Encounter (Signed)
Called and spoke w/ pt that rx is approved, rx sent, instructed the pt to complete fasting labs post 4th dose and to call if the cost is prohibitive and the pt voiced understanding

## 2020-12-31 NOTE — Addendum Note (Signed)
Addended by: Allean Found on: 12/31/2020 01:21 PM   Modules accepted: Orders

## 2020-12-31 NOTE — Telephone Encounter (Signed)
LIPID LAB ORDERED AND RELEASED AS INSTRUCTED

## 2020-12-31 NOTE — Addendum Note (Signed)
Addended by: Allean Found on: 12/31/2020 09:48 AM   Modules accepted: Orders

## 2021-01-05 ENCOUNTER — Inpatient Hospital Stay: Payer: PPO | Attending: Hematology and Oncology | Admitting: Hematology and Oncology

## 2021-01-05 ENCOUNTER — Encounter: Payer: Self-pay | Admitting: Hematology and Oncology

## 2021-01-05 ENCOUNTER — Other Ambulatory Visit: Payer: Self-pay

## 2021-01-05 ENCOUNTER — Inpatient Hospital Stay: Payer: PPO

## 2021-01-05 VITALS — BP 147/76 | HR 58 | Temp 96.9°F | Resp 18 | Wt 183.8 lb

## 2021-01-05 DIAGNOSIS — Z803 Family history of malignant neoplasm of breast: Secondary | ICD-10-CM | POA: Diagnosis not present

## 2021-01-05 DIAGNOSIS — Z7901 Long term (current) use of anticoagulants: Secondary | ICD-10-CM | POA: Insufficient documentation

## 2021-01-05 DIAGNOSIS — D72819 Decreased white blood cell count, unspecified: Secondary | ICD-10-CM

## 2021-01-05 DIAGNOSIS — Z96642 Presence of left artificial hip joint: Secondary | ICD-10-CM | POA: Insufficient documentation

## 2021-01-05 DIAGNOSIS — I110 Hypertensive heart disease with heart failure: Secondary | ICD-10-CM | POA: Diagnosis not present

## 2021-01-05 DIAGNOSIS — I255 Ischemic cardiomyopathy: Secondary | ICD-10-CM | POA: Insufficient documentation

## 2021-01-05 DIAGNOSIS — I509 Heart failure, unspecified: Secondary | ICD-10-CM | POA: Diagnosis not present

## 2021-01-05 DIAGNOSIS — D696 Thrombocytopenia, unspecified: Secondary | ICD-10-CM

## 2021-01-05 DIAGNOSIS — Z951 Presence of aortocoronary bypass graft: Secondary | ICD-10-CM | POA: Insufficient documentation

## 2021-01-05 DIAGNOSIS — Z8042 Family history of malignant neoplasm of prostate: Secondary | ICD-10-CM | POA: Diagnosis not present

## 2021-01-05 DIAGNOSIS — Z7982 Long term (current) use of aspirin: Secondary | ICD-10-CM | POA: Insufficient documentation

## 2021-01-05 DIAGNOSIS — I2510 Atherosclerotic heart disease of native coronary artery without angina pectoris: Secondary | ICD-10-CM | POA: Insufficient documentation

## 2021-01-05 LAB — CBC WITH DIFFERENTIAL/PLATELET
Abs Immature Granulocytes: 0.01 10*3/uL (ref 0.00–0.07)
Basophils Absolute: 0 10*3/uL (ref 0.0–0.1)
Basophils Relative: 1 %
Eosinophils Absolute: 0.1 10*3/uL (ref 0.0–0.5)
Eosinophils Relative: 3 %
HCT: 42.6 % (ref 39.0–52.0)
Hemoglobin: 14.4 g/dL (ref 13.0–17.0)
Immature Granulocytes: 0 %
Lymphocytes Relative: 20 %
Lymphs Abs: 0.9 10*3/uL (ref 0.7–4.0)
MCH: 30.3 pg (ref 26.0–34.0)
MCHC: 33.8 g/dL (ref 30.0–36.0)
MCV: 89.5 fL (ref 80.0–100.0)
Monocytes Absolute: 0.4 10*3/uL (ref 0.1–1.0)
Monocytes Relative: 10 %
Neutro Abs: 2.9 10*3/uL (ref 1.7–7.7)
Neutrophils Relative %: 66 %
Platelets: 124 10*3/uL — ABNORMAL LOW (ref 150–400)
RBC: 4.76 MIL/uL (ref 4.22–5.81)
RDW: 13.6 % (ref 11.5–15.5)
WBC: 4.4 10*3/uL (ref 4.0–10.5)
nRBC: 0 % (ref 0.0–0.2)

## 2021-01-05 NOTE — Progress Notes (Signed)
[facility] CONSULT NOTE  Patient Care Team: Benay Pike, MD as PCP - General (Hematology and Oncology) Buford Dresser, MD as PCP - Cardiology (Cardiology)  CHIEF COMPLAINTS/PURPOSE OF CONSULTATION:  Cytopenia of unknown significance.  ASSESSMENT & PLAN:   This is a very pleasant 70 year old male patient with past medical history significant for coronary artery disease, congestive heart failure and ischemic cardiomyopathy referred to hematology for evaluation of leukopenia and thrombocytopenia.  I reviewed his [id number], folic acid, hepatitis C, TSH labs which were unremarkable Iron, ANA, hemolysis labs normal.  We have discussed that possible causes of thrombocytopenia are medications, bone marrow disorders or chronic ITP Interim ROS pertinent for some back pain which has now resolved and some ongoing fatigue. PE today unremarkable, no concerning findings. CBC today showed platelet count of 124 K  We have once again discussed about possible causes of thrombocytopenia including ITP, early bone marrow disorder such as myelodysplasia.  However since he had mild thrombocytopenia and this has been longstanding, recommended follow-up in 6 months.  Thank you for consulting [place] in the care of this patient.  Please do not hesitate to contact us with any additional questions or concerns  HISTORY OF PRESENTING ILLNESS:   [name] 70 y.o. male is here because of Thrombocytopenia  This is a very pleasant 70 year old male patient with past medical history significant for coronary artery disease, congestive heart failure, hypertension, thrombocytopenia referred to hematology for thrombocytopenia.    Interval history  Since last visit, he had an episode of back pain which lasted for almost a month, finally improved.  It was thought to be possible shingles related pain.  He otherwise has noticed some improvement in fatigue.  No other B symptoms.  No change in breathing,  bowel habits or urinary habits.  No new neurological complaints.  REVIEW OF SYSTEMS:   Constitutional: Denies fevers, chills or abnormal night sweats Eyes: Denies blurriness of vision, double vision or watery eyes Ears, nose, mouth, throat, and face: Denies mucositis or sore throat Respiratory: Denies cough, dyspnea or wheezes Cardiovascular: Denies palpitation, chest discomfort or lower extremity swelling Gastrointestinal:  Denies nausea, heartburn or change in bowel habits Skin: Denies abnormal skin rashes Lymphatics: Denies new lymphadenopathy or easy bruising Neurological:Denies numbness, tingling or new weaknesses Behavioral/Psych: Mood is stable, no new changes  All other systems were reviewed with the patient and are negative.  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[place])    Myocardial infarction ([place])    2012     Sleep apnea    no CPAP in use     SURGICAL HISTORY: Past Surgical History:  Procedure Laterality Date   CARDIAC CATHETERIZATION  2012   X2 with 2 stents    CORONARY ARTERY BYPASS GRAFT  2007   quadruple    ICD IMPLANT  2012   partial discectomy   1995   SPINAL FUSION  2003   lumbar    TOTAL HIP ARTHROPLASTY Left 02/15/2018   Procedure: LEFT TOTAL HIP ARTHROPLASTY ANTERIOR APPROACH;  Surgeon: Ninfa Linden,  Lind Guest, MD;  Location: [facility];  Service: Orthopedics;  Laterality: Left;    SOCIAL HISTORY: Social History   Socioeconomic History   Marital status: Married    Spouse name: Not on file   Number of children: Not on file   Years of education: Not on file   Highest education level: Not on  file  Occupational History   Not on file  Tobacco Use   Smoking status: Never   Smokeless tobacco: Never  Substance and Sexual Activity   Alcohol use: Yes    Comment: occ   Drug use: Not on file   Sexual activity: Not on file  Other Topics Concern   Not on file  Social History Narrative   Not on file   Social Determinants of Health   Financial Resource Strain: Not on file  Food Insecurity: Not on file  Transportation Needs: Not on file  Physical Activity: Not on file  Stress: Not on file  Social Connections: Not on file  Intimate Partner Violence: Not on file    FAMILY HISTORY: Family History  Problem Relation Age of Onset   Breast cancer Mother    Prostate cancer Father     ALLERGIES:  is allergic to crestor [rosuvastatin], lipitor [atorvastatin], morphine and related, other, and statins.  MEDICATIONS:  Current Outpatient Medications  Medication Sig Dispense Refill   aspirin EC 81 MG tablet Take 81 mg by mouth daily.     Cholecalciferol (VITAMIN D3) 2000 units TABS Take 2,000 Units by mouth daily. As needed     Cyanocobalamin (VITAMIN B-12 PO) Take 2,500 mcg by mouth every other day. As needed     Evolocumab (REPATHA SURECLICK) [id number] MG/ML SOAJ Inject 140 mg into the skin every 14 (fourteen) days. 2 mL 11   lisinopril (PRINIVIL,ZESTRIL) 5 MG tablet Take 5 mg by mouth daily.      methylPREDNISolone (MEDROL) 4 MG tablet Take as directed 21 tablet 0   metoprolol succinate (TOPROL-XL) 25 MG 24 hr tablet Take 75 mg by mouth in the morning, at noon, and at bedtime.     Multiple Vitamin (MULTI-VITAMINS) TABS Take 1 tablet by mouth daily.      tadalafil (CIALIS) 5 MG tablet Take 5 mg by mouth daily.      No current facility-administered medications for this visit.     PHYSICAL EXAMINATION:  ECOG PERFORMANCE STATUS: 0 - Asymptomatic  Vitals:   01/05/21 1252  BP: (!) 147/76  Pulse: (!) 58  Resp: 18  Temp: (!) 96.9 F (36.1 C)  SpO2: 100%   Filed Weights    01/05/21 1252  Weight: 183 lb 12.8 oz (83.4 kg)   Physical Exam Constitutional:      Appearance: Normal appearance.  HENT:     Head: Normocephalic and atraumatic.  Cardiovascular:     Rate and Rhythm: Normal rate and regular rhythm.     Pulses: Normal pulses.     Heart sounds: Normal heart sounds.  Pulmonary:     Effort: Pulmonary effort is normal.     Breath sounds: Normal breath sounds.  Abdominal:     General: Abdomen is flat.     Palpations: Abdomen is soft.  Musculoskeletal:        General: Normal range of motion.  Skin:    General: Skin is warm and dry.  Neurological:     General: No focal deficit present.     Mental Status: He is alert.  Psychiatric:  Mood and Affect: Mood normal.     LABORATORY DATA:  I have reviewed the data as listed Lab Results  Component Value Date   WBC 4.4 01/05/2021   HGB 14.4 01/05/2021   HCT 42.6 01/05/2021   MCV 89.5 01/05/2021   PLT 124 (L) 01/05/2021     Chemistry      Component Value Date/Time   NA 136 07/02/2020 1232   K 4.9 07/02/2020 1232   CL 105 07/02/2020 1232   CO2 24 07/02/2020 1232   BUN 9 07/02/2020 1232   CREATININE 0.95 07/02/2020 1232      Component Value Date/Time   CALCIUM 9.0 07/02/2020 1232   ALKPHOS 107 07/02/2020 1232   AST 25 07/02/2020 1232   ALT 11 07/02/2020 1232   BILITOT 0.6 07/02/2020 1232     TSH of 1.8 B12 of 573 Folate of 18 WBC of 3.7 Hep C ab negative. Multiple labs reviewed CBC ranging from 3700 to 4100, platelets mildly low in 120 K 2 yr ago, mild thrombocytopenia, but WBC Count was normal 9 yrs ago, again thrombocytopenia, plt count in 140K, no leukopenia Iron panel and ferritin normal. No evidence of hemolysis  Labs from today with stable platelet count of 124,000.  RADIOGRAPHIC STUDIES: I have personally reviewed the radiological images as listed and agreed with the findings in the report. No results found.  All questions were answered. The patient knows to call  the clinic with any problems, questions or concerns. I spent 20 minutes in the care of this patient including H and P, review of records, counseling and coordination of care.     Benay Pike, MD 01/05/2021 12:56 PM

## 2021-01-21 ENCOUNTER — Telehealth: Payer: Self-pay | Admitting: Cardiology

## 2021-01-21 ENCOUNTER — Encounter (HOSPITAL_BASED_OUTPATIENT_CLINIC_OR_DEPARTMENT_OTHER): Payer: Self-pay

## 2021-01-21 MED ORDER — METOPROLOL SUCCINATE ER 25 MG PO TB24: 75.0000 mg | ORAL_TABLET | Freq: Three times a day (TID) | ORAL | 11 refills | Status: DC

## 2021-01-21 MED ORDER — LISINOPRIL 5 MG PO TABS: 5.0000 mg | ORAL_TABLET | Freq: Every day | ORAL | 11 refills | Status: DC

## 2021-01-21 NOTE — Telephone Encounter (Signed)
  *  STAT* If patient is at the pharmacy, call can be transferred to refill team.   1. Which medications need to be refilled? (please list name of each medication and dose if known)  lisinopril (PRINIVIL,ZESTRIL) 5 MG tablet metoprolol succinate (TOPROL-XL) 25 MG 24 hr tablet  2. Which pharmacy/location (including street and city if local pharmacy) is medication to be sent to? [facility] [id number] - [place], [address]  3. Do they need a 30 day or 90 day supply? 90 days  Pt said been trying to get refill for 5 days, he is out of meds, needs refill today

## 2021-01-26 DIAGNOSIS — E785 Hyperlipidemia, unspecified: Secondary | ICD-10-CM | POA: Diagnosis not present

## 2021-01-27 ENCOUNTER — Ambulatory Visit (INDEPENDENT_AMBULATORY_CARE_PROVIDER_SITE_OTHER): Payer: PPO

## 2021-01-27 DIAGNOSIS — I255 Ischemic cardiomyopathy: Secondary | ICD-10-CM

## 2021-01-27 LAB — LIPID PANEL
Chol/HDL Ratio: 2.6 ratio (ref 0.0–5.0)
Cholesterol, Total: 113 mg/dL (ref 100–199)
HDL: 43 mg/dL (ref >39)
LDL Chol Calc (NIH): 48 mg/dL (ref 0–99)
Triglycerides: 121 mg/dL (ref 0–149)
VLDL Cholesterol Cal: 22 mg/dL (ref 5–40)

## 2021-01-28 LAB — CUP PACEART REMOTE DEVICE CHECK
Battery Remaining Longevity: 54 mo
Battery Remaining Percentage: 57 %
Brady Statistic RV Percent Paced: 1 %
Date Time Interrogation Session: 20220928070800
HighPow Impedance: 97 Ohm
Implantable Lead Implant Date: 20120913
Implantable Lead Location: 753860
Implantable Lead Model: 293
Implantable Lead Serial Number: 107958
Implantable Pulse Generator Implant Date: 20120913
Lead Channel Impedance Value: 420 Ohm
Lead Channel Pacing Threshold Amplitude: 0.8 V
Lead Channel Pacing Threshold Pulse Width: 0.5 ms
Lead Channel Setting Pacing Amplitude: 2 V
Lead Channel Setting Pacing Pulse Width: 0.5 ms
Lead Channel Setting Sensing Sensitivity: 0.6 mV
Pulse Gen Serial Number: 105130

## 2021-01-29 DIAGNOSIS — H10022 Other mucopurulent conjunctivitis, left eye: Secondary | ICD-10-CM | POA: Diagnosis not present

## 2021-02-03 NOTE — Progress Notes (Signed)
Remote pacemaker transmission.   

## 2021-02-12 ENCOUNTER — Other Ambulatory Visit: Payer: Self-pay

## 2021-02-12 DIAGNOSIS — M4807 Spinal stenosis, lumbosacral region: Secondary | ICD-10-CM

## 2021-02-12 NOTE — Telephone Encounter (Signed)
Sent order for CT per Artis Delay patient aware

## 2021-03-05 ENCOUNTER — Encounter: Payer: Self-pay | Admitting: *Deleted

## 2021-03-08 NOTE — Telephone Encounter (Signed)
Noted, order cancelled

## 2021-03-09 ENCOUNTER — Ambulatory Visit (INDEPENDENT_AMBULATORY_CARE_PROVIDER_SITE_OTHER): Payer: PPO

## 2021-03-09 ENCOUNTER — Other Ambulatory Visit: Payer: Self-pay

## 2021-03-09 DIAGNOSIS — I513 Intracardiac thrombosis, not elsewhere classified: Secondary | ICD-10-CM

## 2021-03-09 LAB — ECHOCARDIOGRAM COMPLETE
AR max vel: 3.07 cm2
AV Area VTI: 3.11 cm2
AV Area mean vel: 2.78 cm2
AV Mean grad: 1.5 mmHg
AV Peak grad: 3.2 mmHg
Ao pk vel: 0.89 m/s
Area-P 1/2: 3.53 cm2
S' Lateral: 5.35 cm

## 2021-03-09 MED ORDER — PERFLUTREN LIPID MICROSPHERE
1.0000 mL | INTRAVENOUS | Status: AC | PRN
Start: 2021-03-09 — End: 2021-03-09
  Administered 2021-03-09: 3 mL via INTRAVENOUS

## 2021-03-10 ENCOUNTER — Telehealth: Payer: Self-pay | Admitting: Cardiology

## 2021-03-10 NOTE — Telephone Encounter (Signed)
Pt updated with ECHO results and inquiring if there's anything he can do to help with increasing heart pumping function.  Will forward to MD   Buford Dresser, MD  Meryl Crutch, RN Squeeze of the heart remains low, but there is no evidence of clot re-forming in the heart. OK to remain off of blood thinners.

## 2021-03-10 NOTE — Telephone Encounter (Signed)
Patient returned call for Echo results.

## 2021-03-11 ENCOUNTER — Telehealth: Payer: Self-pay

## 2021-03-11 NOTE — Telephone Encounter (Signed)
Pt updated with MD's recommendations and verbalized understanding.  

## 2021-03-11 NOTE — Telephone Encounter (Signed)
Pt made aware of ECHO results. 

## 2021-03-11 NOTE — Telephone Encounter (Signed)
This is unfortunately not a new issue (which means it is usually not reversible), and we are working with medications as best we can. Right now that is the plan, and often the pump function stays low, but we want to make sure we are doing everything we can with medication to try to take stress off the heart.

## 2021-03-11 NOTE — Telephone Encounter (Signed)
-----   Message from Buford Dresser, MD sent at 03/10/2021  8:43 AM EST ----- Squeeze of the heart remains low, but there is no evidence of clot re-forming in the heart. OK to remain off of blood thinners.

## 2021-04-21 ENCOUNTER — Encounter: Payer: Self-pay | Admitting: Hematology and Oncology

## 2021-04-27 ENCOUNTER — Other Ambulatory Visit (HOSPITAL_BASED_OUTPATIENT_CLINIC_OR_DEPARTMENT_OTHER): Payer: Self-pay

## 2021-04-28 ENCOUNTER — Ambulatory Visit (INDEPENDENT_AMBULATORY_CARE_PROVIDER_SITE_OTHER): Payer: PPO

## 2021-04-28 DIAGNOSIS — I255 Ischemic cardiomyopathy: Secondary | ICD-10-CM | POA: Diagnosis not present

## 2021-04-28 LAB — CUP PACEART REMOTE DEVICE CHECK
Battery Remaining Longevity: 54 mo
Battery Remaining Percentage: 54 %
Brady Statistic RV Percent Paced: 1 %
Date Time Interrogation Session: 20221228071900
HighPow Impedance: 102 Ohm
Implantable Lead Implant Date: 20120913
Implantable Lead Location: 753860
Implantable Lead Model: 293
Implantable Lead Serial Number: 107958
Implantable Pulse Generator Implant Date: 20120913
Lead Channel Impedance Value: 420 Ohm
Lead Channel Pacing Threshold Amplitude: 0.8 V
Lead Channel Pacing Threshold Pulse Width: 0.5 ms
Lead Channel Setting Pacing Amplitude: 2 V
Lead Channel Setting Pacing Pulse Width: 0.5 ms
Lead Channel Setting Sensing Sensitivity: 0.6 mV
Pulse Gen Serial Number: 105130

## 2021-05-04 ENCOUNTER — Telehealth (HOSPITAL_BASED_OUTPATIENT_CLINIC_OR_DEPARTMENT_OTHER): Payer: Self-pay | Admitting: Cardiology

## 2021-05-04 NOTE — Telephone Encounter (Signed)
Received fax from [facility] on [place] requesting PA for [place].   PA sent to plan today.  [name] (Key: [id number])  Arloa Koh is processing your PA request and will respond shortly with next steps. You may close this dialog, return to your dashboard, and perform other tasks. To check for an update later, open this request again from your dashboard.  If you need assistance, please chat with CoverMyMeds or call us at [phone].

## 2021-05-10 ENCOUNTER — Encounter: Payer: Self-pay | Admitting: Internal Medicine

## 2021-05-10 NOTE — Progress Notes (Signed)
Remote ICD transmission.   

## 2021-06-09 ENCOUNTER — Other Ambulatory Visit: Payer: Self-pay

## 2021-06-22 DIAGNOSIS — D1801 Hemangioma of skin and subcutaneous tissue: Secondary | ICD-10-CM | POA: Diagnosis not present

## 2021-06-22 DIAGNOSIS — Z85828 Personal history of other malignant neoplasm of skin: Secondary | ICD-10-CM | POA: Diagnosis not present

## 2021-06-22 DIAGNOSIS — L570 Actinic keratosis: Secondary | ICD-10-CM | POA: Diagnosis not present

## 2021-06-22 DIAGNOSIS — L821 Other seborrheic keratosis: Secondary | ICD-10-CM | POA: Diagnosis not present

## 2021-06-22 DIAGNOSIS — D225 Melanocytic nevi of trunk: Secondary | ICD-10-CM | POA: Diagnosis not present

## 2021-06-22 DIAGNOSIS — D485 Neoplasm of uncertain behavior of skin: Secondary | ICD-10-CM | POA: Diagnosis not present

## 2021-06-22 DIAGNOSIS — C44319 Basal cell carcinoma of skin of other parts of face: Secondary | ICD-10-CM | POA: Diagnosis not present

## 2021-06-30 ENCOUNTER — Other Ambulatory Visit: Payer: Self-pay | Admitting: Cardiology

## 2021-06-30 NOTE — Telephone Encounter (Signed)
Rx(s) sent to pharmacy electronically.  

## 2021-07-01 ENCOUNTER — Inpatient Hospital Stay: Payer: PPO

## 2021-07-01 ENCOUNTER — Inpatient Hospital Stay: Payer: PPO | Attending: Hematology and Oncology | Admitting: Hematology and Oncology

## 2021-07-01 ENCOUNTER — Other Ambulatory Visit: Payer: Self-pay

## 2021-07-01 ENCOUNTER — Encounter: Payer: Self-pay | Admitting: Hematology and Oncology

## 2021-07-01 VITALS — BP 116/62 | HR 56 | Temp 97.9°F | Resp 16 | Ht 70.0 in | Wt 188.9 lb

## 2021-07-01 DIAGNOSIS — Z803 Family history of malignant neoplasm of breast: Secondary | ICD-10-CM | POA: Insufficient documentation

## 2021-07-01 DIAGNOSIS — Z8042 Family history of malignant neoplasm of prostate: Secondary | ICD-10-CM | POA: Diagnosis not present

## 2021-07-01 DIAGNOSIS — I509 Heart failure, unspecified: Secondary | ICD-10-CM | POA: Insufficient documentation

## 2021-07-01 DIAGNOSIS — I2510 Atherosclerotic heart disease of native coronary artery without angina pectoris: Secondary | ICD-10-CM | POA: Insufficient documentation

## 2021-07-01 DIAGNOSIS — D696 Thrombocytopenia, unspecified: Secondary | ICD-10-CM | POA: Diagnosis not present

## 2021-07-01 DIAGNOSIS — D72819 Decreased white blood cell count, unspecified: Secondary | ICD-10-CM

## 2021-07-01 LAB — CBC WITH DIFFERENTIAL/PLATELET
Abs Immature Granulocytes: 0.01 10*3/uL (ref 0.00–0.07)
Basophils Absolute: 0 10*3/uL (ref 0.0–0.1)
Basophils Relative: 1 %
Eosinophils Absolute: 0.2 10*3/uL (ref 0.0–0.5)
Eosinophils Relative: 4 %
HCT: 44.4 % (ref 39.0–52.0)
Hemoglobin: 14.7 g/dL (ref 13.0–17.0)
Immature Granulocytes: 0 %
Lymphocytes Relative: 22 %
Lymphs Abs: 0.9 10*3/uL (ref 0.7–4.0)
MCH: 30.2 pg (ref 26.0–34.0)
MCHC: 33.1 g/dL (ref 30.0–36.0)
MCV: 91.4 fL (ref 80.0–100.0)
Monocytes Absolute: 0.4 10*3/uL (ref 0.1–1.0)
Monocytes Relative: 10 %
Neutro Abs: 2.7 10*3/uL (ref 1.7–7.7)
Neutrophils Relative %: 63 %
Platelets: 132 10*3/uL — ABNORMAL LOW (ref 150–400)
RBC: 4.86 MIL/uL (ref 4.22–5.81)
RDW: 13.3 % (ref 11.5–15.5)
WBC: 4.2 10*3/uL (ref 4.0–10.5)
nRBC: 0 % (ref 0.0–0.2)

## 2021-07-01 NOTE — Progress Notes (Signed)
[name] CONSULT NOTE  Patient Care Team: Benay Pike, MD as PCP - General (Hematology and Oncology) Buford Dresser, MD as PCP - Cardiology (Cardiology)  CHIEF COMPLAINTS/PURPOSE OF CONSULTATION:  Cytopenia of unknown significance.  ASSESSMENT & PLAN:   This is a very pleasant 71 year old male patient with past medical history significant for coronary artery disease, congestive heart failure and ischemic cardiomyopathy referred to hematology for evaluation of thrombocytopenia.  I reviewed his F09, folic acid, hepatitis C, TSH labs which were unremarkable Iron, ANA, hemolysis labs normal.  We have discussed that possible causes of thrombocytopenia are medications, bone marrow disorders or chronic ITP. During his last visit, we discussed about surveillance and [name] Hall is here for a follow up [name] Hall has been feeling a bit more tired lately but otherwise denies any complaints. PE unremarkable, no concerns. Labs reviewed, platelets stable, no other concerns We will follow up with him in 6 months.  Thank you for consulting [place] in the care of this patient.  Please do not hesitate to contact us with any additional questions or concerns  HISTORY OF PRESENTING ILLNESS:   [name] Hall 71 y.o. male is here because of Thrombocytopenia  This is a very pleasant 71 year old male patient with past medical history significant for coronary artery disease, congestive heart failure, hypertension, thrombocytopenia referred to hematology for thrombocytopenia.    Interval history  Since last visit, [name] Hall has been feeling more fatigued. [name] Hall has gained 4/5 lbs mostly in hia abdomen. No fevers, drenching night sweats, loss of appetite. No bleeding complaints. No change in breathing, bowel habits or urinary habits No new neurological complaints Has a basal cell removed  REVIEW OF SYSTEMS:   Constitutional: Denies fevers, chills or abnormal night sweats Eyes: Denies blurriness of  vision, double vision or watery eyes Ears, nose, mouth, throat, and face: Denies mucositis or sore throat Respiratory: Denies cough, dyspnea or wheezes Cardiovascular: Denies palpitation, chest discomfort or lower extremity swelling Gastrointestinal:  Denies nausea, heartburn or change in bowel habits Skin: Denies abnormal skin rashes Lymphatics: Denies new lymphadenopathy or easy bruising Neurological:Denies numbness, tingling or new weaknesses Behavioral/Psych: Mood is stable, no new changes  All other systems were reviewed with the patient and are negative.  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[place])    Left ventricular thrombus    Myocardial infarction ([place])    2012     Sleep apnea    no CPAP in use    Thrombocytopenia ([place])     SURGICAL HISTORY: Past Surgical History:  Procedure Laterality Date   APPENDECTOMY     CARDIAC CATHETERIZATION  2012   X2 with 2 stents    CORONARY ARTERY BYPASS GRAFT  2007   quadruple    ICD IMPLANT  2012   partial discectomy   1995   SPINAL FUSION  2003   lumbar    TOTAL HIP ARTHROPLASTY Left 02/15/2018   Procedure: LEFT TOTAL HIP ARTHROPLASTY ANTERIOR APPROACH;  Surgeon: Mcarthur Rossetti, MD;  Location: [facility];  Service: Orthopedics;  Laterality: Left;    SOCIAL HISTORY: Social History   Socioeconomic History   Marital status: Married    Spouse name: Not on file   Number of children: Not on file   Years of education: Not on file   Highest education level: Not on file  Occupational History   Not  on file  Tobacco Use   Smoking status: Never   Smokeless tobacco: Never  Substance and Sexual Activity   Alcohol use: Yes    Comment: occ   Drug use: Not on file   Sexual activity: Not on file  Other Topics Concern   Not on file  Social History Narrative   Not on file   Social Determinants of Health   Financial Resource Strain: Not on file  Food Insecurity: Not on file  Transportation Needs: Not on file  Physical Activity: Not on file  Stress: Not on file  Social Connections: Not on file  Intimate Partner Violence: Not on file    FAMILY HISTORY: Family History  Problem Relation Age of Onset   Breast cancer Mother    Depression Mother    Hypertension Father    Cancer Father    Prostate cancer Father    Heart disease Father     ALLERGIES:  is allergic to crestor [rosuvastatin], lipitor [atorvastatin], morphine and related, other, and statins.  MEDICATIONS:  Current Outpatient Medications  Medication Sig Dispense Refill   aspirin EC 81 MG tablet Take 81 mg by mouth daily.     Cholecalciferol (VITAMIN D3) 2000 units TABS Take 2,000 Units by mouth daily. As needed     Cyanocobalamin (VITAMIN B-12 PO) Take 2,500 mcg by mouth every other day. As needed     Evolocumab (REPATHA SURECLICK) 244 MG/ML SOAJ Inject 140 mg into the skin every 14 (fourteen) days. 2 mL 11   lisinopril (ZESTRIL) 5 MG tablet Take 1 tablet (5 mg total) by mouth daily. 30 tablet 11   metoprolol succinate (TOPROL-XL) 25 MG 24 hr tablet TAKE THREE TABLETS BY MOUTH EVERY MORNING, AT NOON AND AT BEDTIME 270 tablet 6   Multiple Vitamin (MULTI-VITAMINS) TABS Take 1 tablet by mouth daily.      tadalafil (CIALIS) 5 MG tablet Take 5 mg by mouth daily.      No current facility-administered medications for this visit.     PHYSICAL EXAMINATION:  ECOG PERFORMANCE STATUS: 0 - Asymptomatic  There were no vitals filed for this visit.  There were no vitals filed for this visit.  Physical Exam Constitutional:       Appearance: Normal appearance.  HENT:     Head: Normocephalic and atraumatic.  Cardiovascular:     Rate and Rhythm: Normal rate and regular rhythm.     Pulses: Normal pulses.     Heart sounds: Normal heart sounds.  Pulmonary:     Effort: Pulmonary effort is normal.     Breath sounds: Normal breath sounds.  Abdominal:     General: Abdomen is flat.     Palpations: Abdomen is soft.  Musculoskeletal:        General: Normal range of motion.  Skin:    General: Skin is warm and dry.  Neurological:     General: No focal deficit present.     Mental Status: [name] Hall is alert.  Psychiatric:        Mood and Affect: Mood normal.     LABORATORY DATA:  I have reviewed the data as listed Lab Results  Component Value Date   WBC 4.4 01/05/2021   HGB 14.4 01/05/2021   HCT 42.6 01/05/2021   MCV 89.5 01/05/2021   PLT 124 (L) 01/05/2021     Chemistry      Component Value Date/Time   NA 136 07/02/2020 1232   NA 139 05/06/2020 0000   K 4.9 07/02/2020 1232   CL 105 07/02/2020 1232   CO2 24 07/02/2020 1232   BUN 9 07/02/2020 1232   BUN 11 05/06/2020 0000   CREATININE 0.95 07/02/2020 1232   GLU 78 05/06/2020 0000      Component Value Date/Time   CALCIUM 9.0 07/02/2020 1232   ALKPHOS 107 07/02/2020 1232   AST 25 07/02/2020 1232   ALT 11 07/02/2020 1232   BILITOT 0.6 07/02/2020 1232     TSH of 1.8 B12 of 573 Folate of 18 WBC of 3.7 Hep C ab negative. Multiple labs reviewed CBC ranging from 3700 to 4100, platelets mildly low in 120 K 2 yr ago, mild thrombocytopenia, but WBC Count was normal 9 yrs ago, again thrombocytopenia, plt count in 140K, no leukopenia Iron panel and ferritin normal. No evidence of hemolysis  Labs from today with stable platelet count of 132000  RADIOGRAPHIC STUDIES: I have personally reviewed the radiological images as listed and agreed with the findings in the report. No results found.  All questions were answered. The patient knows to call the  clinic with any problems, questions or concerns. I spent 20 minutes in the care of this patient including H and P, review of records, counseling and coordination of care.     Benay Pike, MD 07/01/2021 1:18 PM

## 2021-07-02 ENCOUNTER — Telehealth: Payer: Self-pay | Admitting: Hematology and Oncology

## 2021-07-02 NOTE — Telephone Encounter (Signed)
Scheduled appointment per 03/02 los. Patient aware.   ?

## 2021-07-06 ENCOUNTER — Other Ambulatory Visit: Payer: Self-pay

## 2021-07-06 ENCOUNTER — Ambulatory Visit (HOSPITAL_BASED_OUTPATIENT_CLINIC_OR_DEPARTMENT_OTHER): Payer: PPO | Admitting: Cardiology

## 2021-07-06 VITALS — BP 111/65 | HR 53 | Ht 70.0 in | Wt 190.4 lb

## 2021-07-06 DIAGNOSIS — I255 Ischemic cardiomyopathy: Secondary | ICD-10-CM

## 2021-07-06 DIAGNOSIS — E7800 Pure hypercholesterolemia, unspecified: Secondary | ICD-10-CM | POA: Diagnosis not present

## 2021-07-06 DIAGNOSIS — I2510 Atherosclerotic heart disease of native coronary artery without angina pectoris: Secondary | ICD-10-CM

## 2021-07-06 DIAGNOSIS — Z951 Presence of aortocoronary bypass graft: Secondary | ICD-10-CM | POA: Diagnosis not present

## 2021-07-06 DIAGNOSIS — Z789 Other specified health status: Secondary | ICD-10-CM | POA: Diagnosis not present

## 2021-07-06 DIAGNOSIS — Z7189 Other specified counseling: Secondary | ICD-10-CM

## 2021-07-06 DIAGNOSIS — I519 Heart disease, unspecified: Secondary | ICD-10-CM

## 2021-07-06 MED ORDER — METOPROLOL SUCCINATE ER 25 MG PO TB24: 25.0000 mg | ORAL_TABLET | Freq: Two times a day (BID) | ORAL | 3 refills | Status: DC

## 2021-07-06 NOTE — Progress Notes (Signed)
Cardiology Office Note:    Date:  07/08/2021   ID:  [name] Hall, DOB [birth year], MRN [id number]  PCP:  Benay Pike, MD  Cardiologist:  Buford Dresser, MD  Referring MD: Benay Pike, MD   CC: follow up  History of Present Illness:    [name] Hall is a 71 y.o. male with a hx of CAD, CHF, fibromyalgia, HLD with statin intolerance, sleep apnea, Myocardial Infarction (2012) s/p CABG x4, ischemic cardiomyopathy who is seen for follow-up. He was initially seen 12/29/20 as a new consult at the request of [name], [name], MD for the evaluation and management of Ischemic cardiomyopathy.  CV history/risk factors: Exercise level: Walks a golf course/mows the lawn without exertional symptoms Current diet: Vegan diet for 10 years. 2019: LV thrombus on echo, placed on apixaban. Did not have repeat study. We repeated in 09/2020, no thrombus, apixaban stopped. Repeat echo pending.   Today: He is doing well. However, he has gained weight and reports having less energy. He endorses back pain starting 6 weeks ago that prevented him from being active. However, he was able to play golf yesterday and felt well. He also uses an exercise bike when he can.   He endorses 2 episodes of positional dizziness. Bending over and getting back up would cause him to feel dizzy.   He takes 2 tablets of metoprolol in the morning and 1 tablet in the evening. Since discontinuing Eliquis, he has been taking a full 325 mg aspirin and using an inversion table twice a week because he is nervous about thrombosis. The inversion also alleviates his back pain.   He has a basal cell carcinoma behind his L ear that is followed by his dermatologist. Previous skin procedures have healed well.   Denies chest pain, shortness of breath at rest or with normal exertion. No PND, orthopnea, LE edema or unexpected weight gain. No syncope or palpitations.   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[place])    Left ventricular thrombus    Myocardial infarction ([place])    2012     Sleep apnea    no CPAP in use    Thrombocytopenia ([place])     Past Surgical History:  Procedure Laterality Date   APPENDECTOMY     CARDIAC CATHETERIZATION  2012   X2 with 2 stents    CORONARY ARTERY BYPASS GRAFT  2007   quadruple    ICD IMPLANT  2012   partial discectomy   1995   SPINAL FUSION  2003   lumbar    TOTAL HIP ARTHROPLASTY Left 02/15/2018   Procedure: LEFT TOTAL HIP ARTHROPLASTY ANTERIOR APPROACH;  Surgeon: Mcarthur Rossetti, MD;  Location: [facility];  Service: Orthopedics;  Laterality: Left;    Current Medications: Current Outpatient Medications on File Prior to Visit  Medication Sig   aspirin EC 81 MG tablet Take 81 mg by mouth daily.   Cholecalciferol (VITAMIN D3) 2000 units TABS Take 2,000 Units by mouth daily.  As needed   Cyanocobalamin (VITAMIN B-12 PO) Take 2,500 mcg by mouth every other day. As needed   Evolocumab (REPATHA SURECLICK) 527 MG/ML SOAJ Inject 140 mg into the skin every 14 (fourteen) days.   lisinopril (ZESTRIL) 5 MG tablet Take 1 tablet (5 mg total) by mouth daily.   Multiple Vitamin (MULTI-VITAMINS) TABS Take 1 tablet by mouth daily.    tadalafil (CIALIS) 5 MG tablet Take 5 mg by mouth daily.    No current facility-administered medications on file prior to visit.     Allergies:   Crestor [rosuvastatin], Lipitor [atorvastatin], Morphine and related, Other, and Statins   Social History   Tobacco Use   Smoking status: Never   Smokeless tobacco: Never  Substance Use Topics    Alcohol use: Yes    Comment: occ    Family History: family history includes Breast cancer in his mother; Cancer in his father; Depression in his mother; Heart disease in his father; Hypertension in his father; Prostate cancer in his father.  ROS:   Please see the history of present illness.   (+) Back pain (+) Fatigue (+) Positional dizziness Additional pertinent ROS otherwise unremarkable.  EKGs/Labs/Other Studies Reviewed:    The following studies were reviewed today: Echo 03/09/21 1. With Definity contrast, the LV apex is seen to be akinetic. There is  no evidence of LV thrombus at this point but there is swirling of blood  and stagnant pooling of blood in the apex. . Left ventricular ejection  fraction, by estimation, is 20 to 25%.   The left ventricle has severely decreased function. The left ventricle  demonstrates global hypokinesis. The left ventricular internal cavity size  was moderately to severely dilated. Left ventricular diastolic parameters  are consistent with Grade II diastolic dysfunction (pseudonormalization). Elevated left ventricular end-diastolic pressure. There is severe akinesis of the left ventricular, apical apical segment and anteroseptal wall.   2. Right ventricular systolic function is moderately reduced. The right  ventricular size is mildly enlarged.   3. Left atrial size was mild to moderately dilated.   4. The mitral valve is grossly normal. Mild mitral valve regurgitation.   5. The aortic valve is grossly normal. Aortic valve regurgitation is not  visualized.   Comparison(s): EF 25-30 %; The entire apex is akinetic. The anterior wall,  antero-lateral wall, anterior septum, inferior wall, posterior wall, mid  inferoseptal segment, and basal inferoseptal segment are hypokinetic. LV  thrombus excluded by echo contrast.   Echo 10/23/2020 1. LV thrombus excluded by echo contrast. Left ventricular ejection  fraction, by estimation, is 25 to 30%.  The left ventricle has severely  decreased function. The left ventricle demonstrates regional wall motion  abnormalities (see scoring  diagram/findings for description). Left ventricular diastolic parameters  are indeterminate.   2. Right ventricular systolic function is normal. The right ventricular  size is normal.   3. The mitral valve is grossly normal. Trivial mitral valve  regurgitation. No evidence of mitral stenosis.   4. The aortic valve is tricuspid. There is mild calcification of the  aortic valve. Aortic valve regurgitation is not visualized. Mild aortic  valve sclerosis is present, with no evidence of aortic valve stenosis.   5. The inferior vena cava is normal in size with greater than 50%  respiratory variability, suggesting right atrial pressure of 3 mmHg.   Comparison(s): Prior images unable to be directly viewed, comparison made  by report only. 11/09/17 EF 25-30%.   Conclusion(s)/Recommendation(s): No evidence of LV  thrombus with use of  echo contrast. EF similar to prior report.   Echo TTE 11/09/2017: - Left ventricle: The cavity size was moderately dilated. Wall    thickness was increased in a pattern of mild LVH. Systolic    function was severely reduced. The estimated ejection fraction    was in the range of 25% to 30%. Diffuse hypokinesis. There is    akinesis of the inferolateral and apical myocardium. Doppler    parameters are consistent with abnormal left ventricular    relaxation (grade 1 diastolic dysfunction).   Impressions:   - Akinesis of the inferolateral and apical walls; overall severe LV    dysfunction; layered apical thrombus noted using definity; mild    diastolic dysfunction; moderate LVE; mild LVH; results discussed    with Derrill Kay MD.   EKG:  EKG is personally reviewed.   07/06/2021: Sinus bradycardia, rate 53 bpm, IVCD, low voltage limb leads 09/29/2020: Sinus bradycardia at 52 bpm, IVCD, low voltage limb leads, lateral TWI  Recent  Labs: 07/01/2021: Hemoglobin 14.7; Platelets 132  Recent Lipid Panel    Component Value Date/Time   CHOL 113 01/26/2021 1353   TRIG 121 01/26/2021 1353   HDL 43 01/26/2021 1353   CHOLHDL 2.6 01/26/2021 1353   CHOLHDL 5.5 07/07/2010 1416   VLDL 35 07/07/2010 1416   LDLCALC 48 01/26/2021 1353   LDLDIRECT 92.3 11/01/2006 0914    Physical Exam:    VS:  BP 111/65 (BP Location: Right Arm, Patient Position: Sitting, Cuff Size: Normal)    Pulse (!) 53    Ht '5\' 10"'$  (1.778 m)    Wt 190 lb 6.4 oz (86.4 kg)    BMI 27.32 kg/m     Wt Readings from Last 3 Encounters:  07/06/21 190 lb 6.4 oz (86.4 kg)  07/01/21 188 lb 14.4 oz (85.7 kg)  01/05/21 183 lb 12.8 oz (83.4 kg)    GEN: Well nourished, well developed in no acute distress HEENT: Normal, moist mucous membranes NECK: No JVD CARDIAC: regular rhythm, normal S1 and S2, no rubs or gallops. No murmur. VASCULAR: Radial and DP pulses 2+ bilaterally. No carotid bruits RESPIRATORY:  Clear to auscultation without rales, wheezing or rhonchi  ABDOMEN: Soft, non-tender, non-distended MUSCULOSKELETAL:  Ambulates independently SKIN: Warm and dry, no edema NEUROLOGIC:  Alert and oriented x 3. No focal neuro deficits noted. PSYCHIATRIC:  Normal affect    ASSESSMENT:    1. Coronary artery disease involving native coronary artery of native heart without angina pectoris   2. Hx of CABG   3. Ischemic cardiomyopathy   4. Pure hypercholesterolemia   5. Statin intolerance   6. Cardiac risk counseling   7. Counseling on health promotion and disease prevention    PLAN:    CAD with prior CABG Hypercholesterolemia, LDL goal <70, with statin intolerance Ischemic cardiomyopathy -continue aspirin, recommend 81 mg dose -has not tolerated multiple statins. Tolerating PCSK9i. Last LDL 48 -NYHA class I -on metoprolol succinate 50 mg in the AM and 25 in the PM. Has ICD in place. With some symptoms and bradycardia, will decrease to metoprolol succinate 25 mg  BID -on lisinopril 5 mg daily, borderline low BP at baseline. Did not tolerate spironolactone in the past due to hyperkalemia. No blood pressure room for entresto -had brain fog on farxiga  History of LV thrombus 2019 -rechecked echo, no thrombus. Stopped anticoagulation, no thrombus on repeat echo  Cardiac risk counseling and prevention recommendations: -recommend heart healthy/Mediterranean diet, with whole  grains, fruits, vegetable, fish, lean meats, nuts, and olive oil. Limit salt. -recommend moderate walking, 3-5 times/week for 30-50 minutes each session. Aim for at least 150 minutes.week. Goal should be pace of 3 miles/hours, or walking 1.5 miles in 30 minutes -recommend avoidance of tobacco products. Avoid excess alcohol.   Plan for follow up: 6 months or sooner as needed.   Buford Dresser, MD, PhD, [facility]    Medication Adjustments/Labs and Tests Ordered: Current medicines are reviewed at length with the patient today.  Concerns regarding medicines are outlined above.  Orders Placed This Encounter  Procedures   EKG 12-Lead    Meds ordered this encounter  Medications   metoprolol succinate (TOPROL-XL) 25 MG 24 hr tablet    Sig: Take 1 tablet (25 mg total) by mouth in the morning and at bedtime.    Dispense:  180 tablet    Refill:  3    Please fill in 90 days (just filled prior script)     Patient Instructions  Medication Instructions:  We are cutting back on the metoprolol dose, please call with any issues  *If you need a refill on your cardiac medications before your next appointment, please call your pharmacy*   Lab Work: None  If you have labs (blood work) drawn today and your tests are completely normal, you will receive your results only by: [place] (if you have MyChart) OR A paper copy in the mail If you have any lab test that is abnormal or we need to change your treatment, we will call you to review the  results.   Testing/Procedures: None   Follow-Up: At [facility], you and your health needs are our priority.  As part of our continuing mission to provide you with exceptional heart care, we have created designated Provider Care Teams.  These Care Teams include your primary Cardiologist (physician) and Advanced Practice Providers (APPs -  Physician Assistants and Nurse Practitioners) who all work together to provide you with the care you need, when you need it.  We recommend signing up for the patient portal called "MyChart".  Sign up information is provided on this After Visit Summary.  MyChart is used to connect with patients for Virtual Visits (Telemedicine).  Patients are able to view lab/test results, encounter notes, upcoming appointments, etc.  Non-urgent messages can be sent to your provider as well.   To learn more about what you can do with MyChart, go to [email or web address].    Your next appointment:   6 month(s)  The format for your next appointment:   In Person  Provider:   Buford Dresser, MD      Wilhemina Bonito as a scribe for Buford Dresser, MD.,have documented all relevant documentation on the behalf of Buford Dresser, MD,as directed by  Buford Dresser, MD while in the presence of Buford Dresser, MD.  I, Buford Dresser, MD, have reviewed all documentation for this visit. The documentation on 07/08/21 for the exam, diagnosis, procedures, and orders are all accurate and complete.   Signed, Buford Dresser, MD PhD 07/08/2021 2:15 PM    [facility]

## 2021-07-06 NOTE — Patient Instructions (Signed)
Medication Instructions:  ?We are cutting back on the metoprolol dose, please call with any issues ? ?*If you need a refill on your cardiac medications before your next appointment, please call your pharmacy* ? ? ?Lab Work: ?None ? ?If you have labs (blood work) drawn today and your tests are completely normal, you will receive your results only by: ?MyChart Message (if you have MyChart) OR ?A paper copy in the mail ?If you have any lab test that is abnormal or we need to change your treatment, we will call you to review the results. ? ? ?Testing/Procedures: ?None ? ? ?Follow-Up: ?At [facility], you and your health needs are our priority.  As part of our continuing mission to provide you with exceptional heart care, we have created designated Provider Care Teams.  These Care Teams include your primary Cardiologist (physician) and Advanced Practice Providers (APPs -  Physician Assistants and Nurse Practitioners) who all work together to provide you with the care you need, when you need it. ? ?We recommend signing up for the patient portal called "MyChart".  Sign up information is provided on this After Visit Summary.  MyChart is used to connect with patients for Virtual Visits (Telemedicine).  Patients are able to view lab/test results, encounter notes, upcoming appointments, etc.  Non-urgent messages can be sent to your provider as well.   ?To learn more about what you can do with MyChart, go to [email or web address].   ? ?Your next appointment:   ?6 month(s) ? ?The format for your next appointment:   ?In Person ? ?Provider:   ?Buford Dresser, MD  ?  ?

## 2021-07-08 ENCOUNTER — Encounter (HOSPITAL_BASED_OUTPATIENT_CLINIC_OR_DEPARTMENT_OTHER): Payer: Self-pay | Admitting: Cardiology

## 2021-07-20 ENCOUNTER — Ambulatory Visit (INDEPENDENT_AMBULATORY_CARE_PROVIDER_SITE_OTHER): Payer: PPO | Admitting: Family Medicine

## 2021-07-20 ENCOUNTER — Encounter: Payer: Self-pay | Admitting: Family Medicine

## 2021-07-20 ENCOUNTER — Other Ambulatory Visit: Payer: Self-pay

## 2021-07-20 VITALS — BP 115/66 | HR 65 | Temp 97.7°F | Ht 69.5 in | Wt 191.0 lb

## 2021-07-20 DIAGNOSIS — I5022 Chronic systolic (congestive) heart failure: Secondary | ICD-10-CM | POA: Diagnosis not present

## 2021-07-20 DIAGNOSIS — I5020 Unspecified systolic (congestive) heart failure: Secondary | ICD-10-CM | POA: Diagnosis not present

## 2021-07-20 DIAGNOSIS — N400 Enlarged prostate without lower urinary tract symptoms: Secondary | ICD-10-CM | POA: Diagnosis not present

## 2021-07-20 DIAGNOSIS — E782 Mixed hyperlipidemia: Secondary | ICD-10-CM

## 2021-07-20 DIAGNOSIS — E291 Testicular hypofunction: Secondary | ICD-10-CM | POA: Insufficient documentation

## 2021-07-20 DIAGNOSIS — Z95810 Presence of automatic (implantable) cardiac defibrillator: Secondary | ICD-10-CM | POA: Diagnosis not present

## 2021-07-20 DIAGNOSIS — D696 Thrombocytopenia, unspecified: Secondary | ICD-10-CM

## 2021-07-20 DIAGNOSIS — I10 Essential (primary) hypertension: Secondary | ICD-10-CM | POA: Diagnosis not present

## 2021-07-20 DIAGNOSIS — Z8639 Personal history of other endocrine, nutritional and metabolic disease: Secondary | ICD-10-CM | POA: Insufficient documentation

## 2021-07-20 DIAGNOSIS — G479 Sleep disorder, unspecified: Secondary | ICD-10-CM | POA: Insufficient documentation

## 2021-07-20 DIAGNOSIS — N529 Male erectile dysfunction, unspecified: Secondary | ICD-10-CM | POA: Insufficient documentation

## 2021-07-20 DIAGNOSIS — Z951 Presence of aortocoronary bypass graft: Secondary | ICD-10-CM | POA: Diagnosis not present

## 2021-07-20 DIAGNOSIS — Z7689 Persons encountering health services in other specified circumstances: Secondary | ICD-10-CM

## 2021-07-20 MED ORDER — TADALAFIL 5 MG PO TABS: 5.0000 mg | ORAL_TABLET | Freq: Every day | ORAL | 1 refills | Status: DC

## 2021-07-20 NOTE — Patient Instructions (Addendum)
?  Great to see you today.  ?I have refilled the medication(s) we provide.  ? ?Next appt end of September for your physical and chronic conditions together.  We will collect all your labs this visit.  ?  ? ? ? ?

## 2021-07-20 NOTE — Progress Notes (Signed)
Patient ID: [name] Hall, male  DOB: March 01, 1951, 71 y.o.   MRN: [id number] Patient Care Team    Relationship Specialty Notifications Start End  Natalia Leatherwood, DO PCP - General Family Medicine  07/20/21   Jodelle Red, MD Consulting Physician Cardiology  07/20/21   Rachel Moulds, MD Consulting Physician Hematology and Oncology  07/20/21   Hillis Range, MD Consulting Physician Cardiology  07/20/21   Arminda Resides, MD Consulting Physician Dermatology  07/20/21     Chief Complaint  Patient presents with   Establish Care    [facility];    Benign Prostatic Hypertrophy    Subjective: [name] Hall is a 71 y.o.  male present for new patient establishment. All past medical history, surgical history, allergies, family history, immunizations, medications and social history were updated in the electronic medical record today. All recent labs, ED visits and hospitalizations within the last year were reviewed.  BPH: Presents today for new patient establishment.  He has a past medical history of BPH that is well controlled on Cialis 5 mg daily.  He receives printed prescriptions for his Cialis so that he can shop for cheapest price.  He states this medicine worked the best for him.  He also feels it helps with his memory.  He is currently diagnosed as NYHA class I through his cardiology team.  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  Followed closely by cardiology. Depression screen [facility] 2/9 07/20/2021  Decreased Interest 0  Down, Depressed, Hopeless 0  PHQ - 2 Score 0   No flowsheet data found.      Fall Risk  07/20/2021  Falls in the past year? 0  Number falls in past yr: 0  Injury with Fall? 0  Risk for fall due to : No Fall Risks  Follow up Falls evaluation completed   Immunization History  Administered Date(s) Administered   Influenza Split 01/06/2012, 05/02/2017, 03/10/2020   Influenza, High Dose Seasonal PF 05/13/2017, 02/16/2018    Influenza,inj,Quad PF,6+ Mos 04/09/2013, 02/26/2015   Influenza-Unspecified 01/18/2012, 05/06/2017, 02/04/2019, 02/18/2021   PFIZER(Purple Top)SARS-COV-2 Vaccination 05/21/2019, 06/11/2019, 02/08/2020   Pneumococcal Conjugate-13 06/08/2017   Pneumococcal Polysaccharide-23 02/16/2018   Tdap 04/17/2007    No results found.  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   Thrombocytopenia (HCC)    Unilateral primary osteoarthritis, left hip 12/18/2017   Unilateral primary osteoarthritis, right hip 12/18/2017   Vegan diet    Allergies  Allergen Reactions   Crestor [Rosuvastatin]     myalgias   Lipitor [Atorvastatin]     myalgias   Morphine And Related     Nausea   Other     VEGAN ( no dairy , no meat, no  fish, no eggs);    Statins     Muscle pain   Past Surgical History:  Procedure Laterality Date   APPENDECTOMY     CARDIAC CATHETERIZATION  2012   X2 with 2 stents    CORONARY ARTERY BYPASS GRAFT  2007   quadruple    ICD IMPLANT  2012   partial discectomy   1995   SPINAL FUSION  2003   lumbar    TOTAL HIP ARTHROPLASTY Left 02/15/2018   Procedure: LEFT TOTAL HIP ARTHROPLASTY ANTERIOR APPROACH;  Surgeon: Kathryne Hitch, MD;  Location: [facility];  Service: Orthopedics;  Laterality: Left;   Family History  Problem Relation Age of Onset   Breast cancer Mother    Depression Mother    Alcohol abuse  Mother    Hypertension Father    Prostate cancer Father    Heart disease Father    Thyroid disease Sister    Social History   Social History Narrative   Marital status/children/pets: Married.   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   Safety:      -Wears a bicycle helmet riding a bike: Yes     -smoke alarm in the home:Yes     - wears seatbelt: Yes          Allergies as of 07/20/2021       Reactions   Crestor [rosuvastatin]    myalgias   Lipitor [atorvastatin]    myalgias   Morphine And Related    Nausea   Other    VEGAN ( no dairy , no meat, no fish, no eggs);    Statins    Muscle pain        Medication List        Accurate as of July 20, 2021  3:13 PM. If you have any questions, ask your nurse or doctor.          aspirin EC 81 MG tablet Take 81 mg by mouth daily.   lisinopril 5 MG tablet Commonly known as: ZESTRIL Take 1 tablet (5 mg total) by mouth daily.   metoprolol succinate 25 MG 24 hr tablet Commonly known as: TOPROL-XL Take 1 tablet (25 mg total) by mouth in the morning and at bedtime.   Multi-Vitamins Tabs Take 1 tablet by mouth daily.   Repatha SureClick 140 MG/ML Soaj Generic drug: Evolocumab Inject 140 mg into the skin every 14 (fourteen) days.   tadalafil 5 MG tablet Commonly known as: CIALIS Take 1 tablet (5 mg total) by mouth daily.   VITAMIN B-12 PO Take 2,500 mcg by mouth every other day. As needed   Vitamin D3 50 MCG (2000 UT) Tabs Take 2,000 Units by mouth daily. As needed        All past medical history, surgical history, allergies, family history, immunizations andmedications were updated in the EMR today and reviewed under the history and medication portions of their EMR.      ROS 14 pt review of systems performed and negative (unless mentioned in an HPI)  Objective: BP 115/66   Pulse 65   Temp 97.7 F (36.5 C) (Oral)   Ht 5' 9.5" (1.765 m)   Wt 191 lb (86.6 kg)   SpO2 98%   BMI  27.80 kg/m  Physical Exam Vitals and nursing note reviewed.  Constitutional:      General: He is not in acute distress.    Appearance: Normal appearance. He is not ill-appearing, toxic-appearing or diaphoretic.  HENT:  Head: Normocephalic and atraumatic.  Eyes:     General: No scleral icterus.       Right eye: No discharge.        Left eye: No discharge.     Extraocular Movements: Extraocular movements intact.     Pupils: Pupils are equal, round, and reactive to light.  Cardiovascular:     Rate and Rhythm: Normal rate and regular rhythm.     Heart sounds: No murmur heard.   No friction rub. No gallop.  Pulmonary:     Effort: Pulmonary effort is normal. No respiratory distress.     Breath sounds: Normal breath sounds. No wheezing, rhonchi or rales.  Musculoskeletal:     Cervical back: Neck supple.     Right lower leg: No edema.     Left lower leg: No edema.  Lymphadenopathy:     Cervical: No cervical adenopathy.  Skin:    General: Skin is warm and dry.     Coloration: Skin is not jaundiced or pale.     Findings: No rash.  Neurological:     Mental Status: He is alert and oriented to person, place, and time. Mental status is at baseline.  Psychiatric:        Mood and Affect: Mood normal.        Behavior: Behavior normal.        Thought Content: Thought content normal.        Judgment: Judgment normal.    Assessment/plan: ELIODORO DAHMS is a 71 y.o. male present for Establishing care with new doctor, encounter for Benign prostatic hyperplasia without lower urinary tract symptoms Stable. Continue Cialis 5 mg daily.  Printed and handed prescription him today. NYHA class I heart failure patient.  He understands if his heart failure progresses, he has symptoms or he is in need of nitrates, he will need to discontinue Cialis use. Encouraged him to discuss safety with his cardiology team Follow-up in September for physical and chronic condition combined appointment with  fasting labs.   NYHA class 1 heart failure with reduced ejection fraction (HCC)-20%/Chronic HFrEF (HCC)-20%/Presence of aortocoronary bypass graft/ Hx of CABG/ ICD (implantable cardioverter-defibrillator), single, in situ/Mixed hyperlipidemia/Essential hypertension Managed closely by cardiology.  Currently prescribed metoprolol 25 mg twice daily, lisinopril 5 mg daily, Repatha and baby aspirin 81 mg daily. Recent echo with decreasing EF 20-25%.(03/09/2022)  Thrombocytopenia (HCC) Managed by hematology.  Borderline platelets with baseline 120-140k   Return in about 24 weeks (around 01/04/2022) for cpe (20 min), Routine chronic condition follow-up.  No orders of the defined types were placed in this encounter.  Meds ordered this encounter  Medications   tadalafil (CIALIS) 5 MG tablet    Sig: Take 1 tablet (5 mg total) by mouth daily.    Dispense:  90 tablet    Refill:  1   Referral Orders  No referral(s) requested today     Note is dictated utilizing voice recognition software. Although note has been proof read prior to signing, occasional typographical errors still can be missed. If any questions arise, please do not hesitate to call for verification.  Electronically signed by: Felix Pacini, DO [facility] [place]

## 2021-07-22 DIAGNOSIS — Z85828 Personal history of other malignant neoplasm of skin: Secondary | ICD-10-CM | POA: Diagnosis not present

## 2021-07-22 DIAGNOSIS — C44319 Basal cell carcinoma of skin of other parts of face: Secondary | ICD-10-CM | POA: Diagnosis not present

## 2021-07-28 ENCOUNTER — Ambulatory Visit (INDEPENDENT_AMBULATORY_CARE_PROVIDER_SITE_OTHER): Payer: PPO

## 2021-07-28 DIAGNOSIS — I255 Ischemic cardiomyopathy: Secondary | ICD-10-CM

## 2021-07-28 LAB — CUP PACEART REMOTE DEVICE CHECK
Battery Remaining Longevity: 48 mo
Battery Remaining Percentage: 51 %
Brady Statistic RV Percent Paced: 1 %
Date Time Interrogation Session: 20230329095600
HighPow Impedance: 113 Ohm
Implantable Lead Implant Date: 20120913
Implantable Lead Location: 753860
Implantable Lead Model: 293
Implantable Lead Serial Number: 107958
Implantable Pulse Generator Implant Date: 20120913
Lead Channel Impedance Value: 480 Ohm
Lead Channel Pacing Threshold Amplitude: 0.8 V
Lead Channel Pacing Threshold Pulse Width: 0.5 ms
Lead Channel Setting Pacing Amplitude: 2 V
Lead Channel Setting Pacing Pulse Width: 0.5 ms
Lead Channel Setting Sensing Sensitivity: 0.6 mV
Pulse Gen Serial Number: 105130

## 2021-08-10 NOTE — Progress Notes (Signed)
Remote ICD transmission.   

## 2021-09-09 ENCOUNTER — Encounter (HOSPITAL_BASED_OUTPATIENT_CLINIC_OR_DEPARTMENT_OTHER): Payer: Self-pay | Admitting: Internal Medicine

## 2021-09-09 ENCOUNTER — Ambulatory Visit (INDEPENDENT_AMBULATORY_CARE_PROVIDER_SITE_OTHER): Payer: PPO | Admitting: Internal Medicine

## 2021-09-09 VITALS — BP 118/86 | HR 59 | Ht 70.0 in | Wt 189.6 lb

## 2021-09-09 DIAGNOSIS — I255 Ischemic cardiomyopathy: Secondary | ICD-10-CM

## 2021-09-09 NOTE — Progress Notes (Signed)
? ? ?PCP: Kuneff, Renee A, DO ?Primary Cardiologist: Dr Harrell Gave ?Primary EP: Dr Rayann Heman ? ?[name] Hall is a 71 y.o. male who presents today for routine electrophysiology followup.  Since last being seen in our clinic, the patient reports doing very well.  Today, he denies symptoms of palpitations, chest pain, shortness of breath,  lower extremity edema, dizziness, presyncope, syncope, or ICD shocks.  The patient is otherwise without complaint today.  ? ?Past Medical History:  ?Diagnosis Date  ? BPH (benign prostatic hyperplasia)   ? CAD (coronary artery disease)   ? Cardiac defibrillator in place   ? CHF (congestive heart failure) ([place])   ? EF 25%  ? Complication of anesthesia   ? slow to wake after a surgery years ago ; but no issues with subsequent surgeries   ? Fibromyalgia   ? HLD (hyperlipidemia)   ? Hx of CABG   ? Hyperkalemia 04/16/2014  ? Hypertension   ? Ischemic cardiomyopathy   ? 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 Myocardial infarction [facility])   ? 2012    ? Sleep apnea   ? no CPAP in use   ? 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 Thrombocytopenia ([place])   ? Unilateral primary osteoarthritis, left hip 12/18/2017  ? Unilateral primary osteoarthritis, right hip 12/18/2017  ? Vegan diet   ? ?Past Surgical History:  ?Procedure Laterality Date  ? APPENDECTOMY    ? CARDIAC CATHETERIZATION  2012  ? X2 with 2 stents   ? CORONARY ARTERY BYPASS GRAFT  2007  ? quadruple   ? ICD IMPLANT  2012  ? partial discectomy   1995  ? SPINAL FUSION  2003  ? lumbar   ? TOTAL HIP ARTHROPLASTY Left 02/15/2018  ? Procedure: LEFT TOTAL HIP ARTHROPLASTY ANTERIOR APPROACH;  Surgeon: Mcarthur Rossetti, MD;  Location: [facility];  Service: Orthopedics;  Laterality: Left;  ? ? ?ROS- all systems are reviewed and negative except as per HPI above ? ?Current Outpatient Medications  ?Medication Sig Dispense Refill  ? aspirin EC 81 MG tablet Take 81 mg by mouth daily.    ? Cholecalciferol (VITAMIN D3)  2000 units TABS Take 2,000 Units by mouth daily. As needed    ? Cyanocobalamin (VITAMIN B-12 PO) Take 2,500 mcg by mouth every other day. As needed    ? Evolocumab (REPATHA SURECLICK) 564 MG/ML SOAJ Inject 140 mg into the skin every 14 (fourteen) days. 2 mL 11  ? lisinopril (ZESTRIL) 5 MG tablet Take 1 tablet (5 mg total) by mouth daily. 30 tablet 11  ? metoprolol succinate (TOPROL-XL) 25 MG 24 hr tablet Take 1 tablet (25 mg total) by mouth in the morning and at bedtime. 180 tablet 3  ? Multiple Vitamin (MULTI-VITAMINS) TABS Take 1 tablet by mouth daily.     ? tadalafil (CIALIS) 5 MG tablet Take 1 tablet (5 mg total) by mouth daily. 90 tablet 1  ? ?No current facility-administered medications for this visit.  ? ? ?Physical Exam: ?Vitals:  ? 09/09/21 1343  ?BP: 118/86  ?Pulse: (!) 59  ?Weight: 189 lb 9.6 oz (86 kg)  ?Height: '5\' 10"'$  (1.778 m)  ? ? ?GEN- The patient is well appearing, alert and oriented x 3 today.   ?Head- normocephalic, atraumatic ?Eyes-  Sclera clear, conjunctiva pink ?Ears- hearing intact ?Oropharynx- clear ?Lungs- Clear to ausculation bilaterally, normal work of breathing ?Chest- ICD pocket is well healed ?Heart- Regular rate and rhythm, no  murmurs, rubs or gallops, PMI not laterally displaced ?GI- soft, NT, ND, + BS ?Extremities- no clubbing, cyanosis, or edema ? ?ICD interrogation- reviewed in detail today,  See PACEART report ? ?ekg tracing ordered 07/06/21 is personally reviewed and shows sinus bradycardia 52 bpm, PR 176 msec, IVCD with QRS 122 msec ? ?Wt Readings from Last 3 Encounters:  ?09/09/21 189 lb 9.6 oz (86 kg)  ?07/20/21 191 lb (86.6 kg)  ?07/06/21 190 lb 6.4 oz (86.4 kg)  ? ? ?Assessment and Plan: ? ?1.  Chronic systolic dysfunction/ ischemic CM/ CAD ?euvolemic today ?No ischemic symptoms ?Stable on an appropriate medical regimen ?Normal ICD function ?See Claudia Desanct[facility] Art report ?No changes today ?he is not device dependant today ? ?2. HTN ?Stable ?No change required today ? ? ?Risks, benefits  and potential toxicities for medications prescribed and/or refilled reviewed with patient today.  ? ?Return in a year ? ?Thompson Grayer MD, FACC ?09/09/2021 ?1:57 PM ? ?

## 2021-09-09 NOTE — Patient Instructions (Addendum)
Medication Instructions:  ?Your physician recommends that you continue on your current medications as directed. Please refer to the Current Medication list given to you today. ?*If you need a refill on your cardiac medications before your next appointment, please call your pharmacy* ? ?Lab Work: ?None ordered. ?If you have labs (blood work) drawn today and your tests are completely normal, you will receive your results only by: ?MyChart Message (if you have MyChart) OR ?A paper copy in the mail ?If you have any lab test that is abnormal or we need to change your treatment, we will call you to review the results. ? ?Testing/Procedures: ?None ordered. ? ?Follow-Up: ?At [facility], you and your health needs are our priority.  As part of our continuing mission to provide you with exceptional heart care, we have created designated Provider Care Teams.  These Care Teams include your primary Cardiologist (physician) and Advanced Practice Providers (APPs -  Physician Assistants and Nurse Practitioners) who all work together to provide you with the care you need, when you need it. ? ?Your next appointment:   ?Your physician wants you to follow-up in: one year with Dr. Rayann Heman  ?You will receive a reminder letter in the mail two months in advance. If you don't receive a letter, please call our office to schedule the follow-up appointment. ? ?Remote monitoring is used to monitor your ICD from home. This monitoring reduces the number of office visits required to check your device to one time per year. It allows [place] to keep an eye on the functioning of your device to ensure it is working properly. You are scheduled for a device check from home on 10/27/2021. You may send your transmission at any time that day. If you have a wireless device, the transmission will be sent automatically. After your physician reviews your transmission, you will receive a postcard with your next transmission date. ? ? ? ?Important Information About  [place] ? ? ? ? ? ? ? ? ?

## 2021-09-21 ENCOUNTER — Telehealth: Payer: Self-pay | Admitting: Family Medicine

## 2021-09-21 NOTE — Telephone Encounter (Signed)
Spoke with spouse they were out of town req CB in June 2023

## 2021-10-27 ENCOUNTER — Ambulatory Visit (INDEPENDENT_AMBULATORY_CARE_PROVIDER_SITE_OTHER): Payer: PPO

## 2021-10-27 DIAGNOSIS — Z95810 Presence of automatic (implantable) cardiac defibrillator: Secondary | ICD-10-CM | POA: Diagnosis not present

## 2021-10-27 DIAGNOSIS — I255 Ischemic cardiomyopathy: Secondary | ICD-10-CM | POA: Diagnosis not present

## 2021-10-27 LAB — CUP PACEART REMOTE DEVICE CHECK
Battery Remaining Longevity: 42 mo
Battery Remaining Percentage: 47 %
Brady Statistic RV Percent Paced: 0 %
Date Time Interrogation Session: 20230628122300
HighPow Impedance: 110 Ohm
Implantable Lead Implant Date: 20120913
Implantable Lead Location: 753860
Implantable Lead Model: 293
Implantable Lead Serial Number: 107958
Implantable Pulse Generator Implant Date: 20120913
Lead Channel Impedance Value: 446 Ohm
Lead Channel Pacing Threshold Amplitude: 0.8 V
Lead Channel Pacing Threshold Pulse Width: 0.5 ms
Lead Channel Setting Pacing Amplitude: 2 V
Lead Channel Setting Pacing Pulse Width: 0.5 ms
Lead Channel Setting Sensing Sensitivity: 0.6 mV
Pulse Gen Serial Number: 105130

## 2021-11-16 NOTE — Progress Notes (Signed)
Remote ICD transmission.   

## 2021-11-17 ENCOUNTER — Ambulatory Visit (INDEPENDENT_AMBULATORY_CARE_PROVIDER_SITE_OTHER): Payer: PPO

## 2021-11-17 DIAGNOSIS — Z0000 Encounter for general adult medical examination without abnormal findings: Secondary | ICD-10-CM | POA: Diagnosis not present

## 2021-11-17 NOTE — Progress Notes (Signed)
Virtual Visit via Telephone Note  I connected with  [name] Hall on 11/17/21 at  3:00 PM EDT by telephone and verified that I am speaking with the correct person using two identifiers.  Medicare Annual Wellness visit completed telephonically due to Covid-19 pandemic.   Persons participating in this call: This Health Coach and this patient.   Location: Patient: Home Provider: Office    I discussed the limitations, risks, security and privacy concerns of performing an evaluation and management service by telephone and the availability of in person appointments. The patient expressed understanding and agreed to proceed.  Unable to perform video visit due to video visit attempted and failed and/or patient does not have video capability.   Some vital signs may be absent or patient reported.   Willette Brace, LPN   Subjective:   [name] Hall is a 71 y.o. male who presents for Medicare Annual/Subsequent preventive examination.  Review of Systems     Cardiac Risk Factors include: advanced age (>23mn, >>63women)     Objective:    There were no vitals filed for this visit. There is no height or weight on file to calculate BMI.     11/17/2021    3:06 PM 04/21/2020    1:03 PM 02/15/2018    2:14 PM 02/08/2018    4:21 PM  Advanced Directives  Does Patient Have a Medical Advance Directive? No No No No  Would patient like information on creating a medical advance directive? Yes (MAU/Ambulatory/Procedural Areas - Information given) No - Patient declined No - Patient declined No - Patient declined    Current Medications (verified) Outpatient Encounter Medications as of 11/17/2021  Medication Sig   aspirin EC 81 MG tablet Take 81 mg by mouth daily.   Cholecalciferol (VITAMIN D3) 2000 units TABS Take 2,000 Units by mouth daily. As needed   Cyanocobalamin (VITAMIN B-12 PO) Take 2,500 mcg by mouth every other day. As needed   Evolocumab (REPATHA SURECLICK) 1099MG/ML SOAJ Inject  140 mg into the skin every 14 (fourteen) days.   lisinopril (ZESTRIL) 5 MG tablet Take 1 tablet (5 mg total) by mouth daily.   metoprolol succinate (TOPROL-XL) 25 MG 24 hr tablet Take 1 tablet (25 mg total) by mouth in the morning and at bedtime.   Multiple Vitamin (MULTI-VITAMINS) TABS Take 1 tablet by mouth daily.    tadalafil (CIALIS) 5 MG tablet Take 1 tablet (5 mg total) by mouth daily.   No facility-administered encounter medications on file as of 11/17/2021.    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[facility]    Myocardial infarction ([place]    2012     Sleep apnea    no CPAP in use    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   Thrombocytopenia ([facility])    Unilateral primary osteoarthritis, left hip 12/18/2017  Unilateral primary osteoarthritis, right hip 12/18/2017   Vegan diet    Past Surgical History:  Procedure Laterality Date   APPENDECTOMY     CARDIAC CATHETERIZATION  2012   X2 with 2 stents    CORONARY ARTERY BYPASS GRAFT  2007   quadruple    ICD IMPLANT  2012   partial discectomy   1995   SPINAL FUSION  2003   lumbar    TOTAL HIP ARTHROPLASTY Left 02/15/2018   Procedure: LEFT TOTAL HIP ARTHROPLASTY ANTERIOR APPROACH;  Surgeon: Mcarthur Rossetti, MD;  Location: [facility];  Service: Orthopedics;  Laterality: Left;   Family History  Problem Relation Age of Onset   Breast cancer Mother     Depression Mother    Alcohol abuse Mother    Hypertension Father    Prostate cancer Father    Heart disease Father    Thyroid disease Sister    Social History   Socioeconomic History   Marital status: Married    Spouse name: Not on file   Number of children: Not on file   Years of education: Not on file   Highest education level: Not on file  Occupational History   Not on file  Tobacco Use   Smoking status: Never    Passive exposure: Never   Smokeless tobacco: Never  Vaping Use   Vaping Use: Never used  Substance and Sexual Activity   Alcohol use: Yes    Comment: occ   Drug use: Never   Sexual activity: Yes    Partners: Female    Birth control/protection: None    Comment: married  Other Topics Concern   Not on file  Social History Narrative   Marital status/children/pets: Married.   Education/employment: Printmaker.  Retired Scientist, research (physical sciences).   Safety:      -Wears a bicycle helmet riding a bike: Yes     -smoke alarm in the home:Yes     - wears seatbelt: Yes         Social Determinants of Health   Financial Resource Strain: Low Risk  (11/17/2021)   Overall Financial Resource Strain (CARDIA)    Difficulty of Paying Living Expenses: Not hard at all  Food Insecurity: No Food Insecurity (11/17/2021)   Hunger Vital Sign    Worried About Running Out of Food in the Last Year: Never true    Ran Out of Food in the Last Year: Never true  Transportation Needs: No Transportation Needs (11/17/2021)   PRAPARE - Hydrologist (Medical): No    Lack of Transportation (Non-Medical): No  Physical Activity: Inactive (11/17/2021)   Exercise Vital Sign    Days of Exercise per Week: 0 days    Minutes of Exercise per Session: 0 min  Stress: No Stress Concern Present (11/17/2021)   [place]    Feeling of Stress : Not at all  Social Connections: Moderately Integrated (11/17/2021)   Social  Connection and Isolation Panel [NHANES]    Frequency of Communication with Friends and Family: Once a week    Frequency of Social Gatherings with Friends and Family: More than three times a week    Attends Religious Services: Never    Marine scientist or Organizations: Yes    Attends Music therapist: More than 4 times per year    Marital Status: Married    Tobacco Counseling Counseling given: Not Answered   Clinical Intake:  Pre-visit  preparation completed: Yes  Pain : No/denies pain     BMI - recorded: 27.2 Nutritional Status: BMI 25 -29 Overweight Nutritional Risks: None Diabetes: No  How often do you need to have someone help you when you read instructions, pamphlets, or other written materials from your doctor or pharmacy?: 1 - Never  Diabetic?no  Interpreter Needed?: No  Information entered by :: Charlott Rakes, LPN   Activities of Daily Living    11/17/2021    3:07 PM  In your present state of health, do you have any difficulty performing the following activities:  Hearing? 0  Vision? 0  Difficulty concentrating or making decisions? 0  Walking or climbing stairs? 0  Dressing or bathing? 0  Doing errands, shopping? 0  Preparing Food and eating ? N  Using the Toilet? N  In the past six months, have you accidently leaked urine? N  Do you have problems with loss of bowel control? N  Managing your Medications? N  Managing your Finances? N  Housekeeping or managing your Housekeeping? N    Patient Care Team: Ma Hillock, DO as PCP - General (Family Medicine) Buford Dresser, MD as Consulting Physician (Cardiology) Benay Pike, MD as Consulting Physician (Hematology and Oncology) Thompson Grayer, MD as Consulting Physician (Cardiology) Danella Sensing, MD as Consulting Physician (Dermatology)  Indicate any recent Medical Services you may have received from other than [facility] providers in the past year (date may be  approximate).     Assessment:   This is a routine wellness examination for [name] Hall.  Hearing/Vision screen Hearing Screening - Comments:: Pt denies any hearing issues  Vision Screening - Comments:: [facility] with [facility] for [facility] exams   Dietary issues and exercise activities discussed: Current Exercise Habits: The patient does not participate in regular exercise at present   Goals Addressed             This Visit's Progress    Patient Stated       None at this time        Depression Screen    11/17/2021    3:04 PM 07/20/2021    1:32 PM  PHQ 2/9 Scores  PHQ - 2 Score 0 0    Fall Risk    11/17/2021    3:07 PM 07/20/2021    1:32 PM  [place] in the past year? 0 0  Number falls in past yr: 0 0  Injury with Fall? 0 0  Risk for fall due to :  No Fall Risks  Follow up Falls prevention discussed Falls evaluation completed    [place]:  Any stairs in or around the home? Yes  If so, are there any without handrails? Yes  Home free of loose throw rugs in walkways, pet beds, electrical cords, etc? Yes  Adequate lighting in your home to reduce risk of falls? Yes   ASSISTIVE DEVICES UTILIZED TO PREVENT FALLS:  Life alert? No  Use of a cane, walker or w/c? No  Grab bars in the bathroom? Yes  Shower chair or bench in shower? Yes  Elevated toilet seat or a handicapped toilet? yes  TIMED UP AND GO:  Was the test performed? No .   Cognitive Function: declined at this time         Immunizations Immunization History  Administered Date(s) Administered   Influenza Split 01/06/2012, 05/02/2017, 03/10/2020   Influenza, High Dose Seasonal PF 05/13/2017, 02/16/2018  Influenza,inj,Quad PF,6+ Mos 04/09/2013, 02/26/2015   Influenza-Unspecified 01/18/2012, 05/06/2017, 02/04/2019, 02/18/2021   PFIZER(Purple Top)SARS-COV-2 Vaccination 05/21/2019, 06/11/2019, 02/08/2020   Pneumococcal Conjugate-13 06/08/2017    Pneumococcal Polysaccharide-23 02/16/2018   Tdap 04/17/2007    TDAP status: Due, Education has been provided regarding the importance of this vaccine. Advised may receive this vaccine at local pharmacy or Health Dept. Aware to provide a copy of the vaccination record if obtained from local pharmacy or Health Dept. Verbalized acceptance and understanding.  Flu Vaccine status: Up to date  Pneumococcal vaccine status: Up to date  Covid-19 vaccine status: Completed vaccines  Qualifies for Shingles Vaccine? Yes   Zostavax completed No   Shingrix Completed?: No.    Education has been provided regarding the importance of this vaccine. Patient has been advised to call insurance company to determine out of pocket expense if they have not yet received this vaccine. Advised may also receive vaccine at local pharmacy or Health Dept. Verbalized acceptance and understanding.  Screening Tests Health Maintenance  Topic Date Due   TETANUS/TDAP  04/16/2017   Zoster Vaccines- Shingrix (1 of 2) 02/17/2022 (Originally 03/30/1970)   COLONOSCOPY (Pts 45-80yr Insurance coverage will need to be confirmed)  11/18/2022 (Originally 05/02/2016)   COVID-19 Vaccine (4 - Booster for PRoyseries) 08/06/2023 (Originally 04/04/2020)   INFLUENZA VACCINE  11/30/2021   Pneumonia Vaccine 71 Years old  Completed   Hepatitis C Screening  Completed   HPV VACCINES  Aged Out    Health Maintenance  Health Maintenance Due  Topic Date Due   TETANUS/TDAP  04/16/2017    Pt declined colonoscopy at this time    Additional Screening:  Hepatitis C Screening:Completed 06/09/17  Vision Screening: Recommended annual ophthalmology exams for early detection of glaucoma and other disorders of the eye. Is the patient up to date with their annual eye exam?  No  Who is the provider or what is the name of the office in which the patient attends annual eye exams? [facility]  If pt is not established with a provider, would they like to  be referred to a provider to establish care? No .   Dental Screening: Recommended annual dental exams for proper oral hygiene  Community Resource Referral / Chronic Care Management: CRR required this visit?  No   CCM required this visit?  No      Plan:     I have personally reviewed and noted the following in the patient's chart:   Medical and social history Use of alcohol, tobacco or illicit drugs  Current medications and supplements including opioid prescriptions. Patient is not currently taking opioid prescriptions. Functional ability and status Nutritional status Physical activity Advanced directives List of other physicians Hospitalizations, surgeries, and ER visits in previous 12 months Vitals Screenings to include cognitive, depression, and falls Referrals and appointments  In addition, I have reviewed and discussed with patient certain preventive protocols, quality metrics, and Hall practice recommendations. A written personalized care plan for preventive services as well as general preventive health recommendations were provided to patient.     TWillette Brace LPN   [id number]/12/[id number]  Nurse Notes: None

## 2021-11-17 NOTE — Patient Instructions (Signed)
Mr. [name] Hall ,,  Thank you for taking time to come for your Medicare Wellness Visit. I appreciate your ongoing commitment to your health goals. Please review the following plan we discussed and let me know if I can assist you in the future.   Screening recommendations/referrals: Colonoscopy: declined at this time  Recommended yearly ophthalmology/optometry visit for glaucoma screening and checkup Recommended yearly dental visit for hygiene and checkup  Vaccinations: Influenza vaccine: done 02/18/21 repeat every year  Pneumococcal vaccine: Up to date Tdap vaccine: due and discussed  Shingles vaccine: Shingrix discussed. Please contact your pharmacy for coverage information.    Covid-19: completed 1/19, 2/9, & 02/08/20  Advanced directives: Advance directive discussed with you today. I have provided a copy for you to complete at home and have notarized. Once this is complete please bring a copy in to our office so we can scan it into your chart.  Conditions/risks identified: none at this time   Next appointment: Follow up in one year for your annual wellness visit.   Preventive Care 61 Years and Older, Male Preventive care refers to lifestyle choices and visits with your health care provider that can promote health and wellness. What does preventive care include? A yearly physical exam. This is also called an annual well check. Dental exams once or twice a year. Routine eye exams. Ask your health care provider how often you should have your eyes checked. Personal lifestyle choices, including: Daily care of your teeth and gums. Regular physical activity. Eating a healthy diet. Avoiding tobacco and drug use. Limiting alcohol use. Practicing safe sex. Taking low doses of aspirin every day. Taking vitamin and mineral supplements as recommended by your health care provider. What happens during an annual well check? The services and screenings done by your health care provider during your  annual well check will depend on your age, overall health, lifestyle risk factors, and family history of disease. Counseling  Your health care provider may ask you questions about your: Alcohol use. Tobacco use. Drug use. Emotional well-being. Home and relationship well-being. Sexual activity. Eating habits. History of falls. Memory and ability to understand (cognition). Work and work Statistician. Screening  You may have the following tests or measurements: Height, weight, and BMI. Blood pressure. Lipid and cholesterol levels. These may be checked every 5 years, or more frequently if you are over 51 years old. Skin check. Lung cancer screening. You may have this screening every year starting at age 72 if you have a 30-pack-year history of smoking and currently smoke or have quit within the past 15 years. Fecal occult blood test (FOBT) of the stool. You may have this test every year starting at age 108. Flexible sigmoidoscopy or colonoscopy. You may have a sigmoidoscopy every 5 years or a colonoscopy every 10 years starting at age 16. Prostate cancer screening. Recommendations will vary depending on your family history and other risks. Hepatitis C blood test. Hepatitis B blood test. Sexually transmitted disease (STD) testing. Diabetes screening. This is done by checking your blood sugar (glucose) after you have not eaten for a while (fasting). You may have this done every 1-3 years. Abdominal aortic aneurysm (AAA) screening. You may need this if you are a current or former smoker. Osteoporosis. You may be screened starting at age 27 if you are at high risk. Talk with your health care provider about your test results, treatment options, and if necessary, the need for more tests. Vaccines  Your health care provider may recommend certain  vaccines, such as: Influenza vaccine. This is recommended every year. Tetanus, diphtheria, and acellular pertussis (Tdap, Td) vaccine. You may need a Td  booster every 10 years. Zoster vaccine. You may need this after age 63. Pneumococcal 13-valent conjugate (PCV13) vaccine. One dose is recommended after age 69. Pneumococcal polysaccharide (PPSV23) vaccine. One dose is recommended after age 70. Talk to your health care provider about which screenings and vaccines you need and how often you need them. This information is not intended to replace advice given to you by your health care provider. Make sure you discuss any questions you have with your health care provider. Document Released: 05/15/2015 Document Revised: 01/06/2016 Document Reviewed: 02/17/2015 Elsevier Interactive Patient Education  2017 [place] Prevention in the Home Falls can cause injuries. They can happen to people of all ages. There are many things you can do to make your home safe and to help prevent falls. What can I do on the outside of my home? Regularly fix the edges of walkways and driveways and fix any cracks. Remove anything that might make you trip as you walk through a door, such as a raised step or threshold. Trim any bushes or trees on the path to your home. Use bright outdoor lighting. Clear any walking paths of anything that might make someone trip, such as rocks or tools. Regularly check to see if handrails are loose or broken. Make sure that both sides of any steps have handrails. Any raised decks and porches should have guardrails on the edges. Have any leaves, snow, or ice cleared regularly. Use sand or salt on walking paths during winter. Clean up any spills in your garage right away. This includes oil or grease spills. What can I do in the bathroom? Use night lights. Install grab bars by the toilet and in the tub and shower. Do not use towel bars as grab bars. Use non-skid mats or decals in the tub or shower. If you need to sit down in the shower, use a plastic, non-slip stool. Keep the floor dry. Clean up any water that spills on the floor  as soon as it happens. Remove soap buildup in the tub or shower regularly. Attach bath mats securely with double-sided non-slip rug tape. Do not have throw rugs and other things on the floor that can make you trip. What can I do in the bedroom? Use night lights. Make sure that you have a light by your bed that is easy to reach. Do not use any sheets or blankets that are too big for your bed. They should not hang down onto the floor. Have a firm chair that has side arms. You can use this for support while you get dressed. Do not have throw rugs and other things on the floor that can make you trip. What can I do in the kitchen? Clean up any spills right away. Avoid walking on wet floors. Keep items that you use a lot in easy-to-reach places. If you need to reach something above you, use a strong step stool that has a grab bar. Keep electrical cords out of the way. Do not use floor polish or wax that makes floors slippery. If you must use wax, use non-skid floor wax. Do not have throw rugs and other things on the floor that can make you trip. What can I do with my stairs? Do not leave any items on the stairs. Make sure that there are handrails on both sides of the stairs  and use them. Fix handrails that are broken or loose. Make sure that handrails are as long as the stairways. Check any carpeting to make sure that it is firmly attached to the stairs. Fix any carpet that is loose or worn. Avoid having throw rugs at the top or bottom of the stairs. If you do have throw rugs, attach them to the floor with carpet tape. Make sure that you have a light switch at the top of the stairs and the bottom of the stairs. If you do not have them, ask someone to add them for you. What else can I do to help prevent falls? Wear shoes that: Do not have high heels. Have rubber bottoms. Are comfortable and fit you well. Are closed at the toe. Do not wear sandals. If you use a stepladder: Make sure that it is  fully opened. Do not climb a closed stepladder. Make sure that both sides of the stepladder are locked into place. Ask someone to hold it for you, if possible. Clearly mark and make sure that you can see: Any grab bars or handrails. First and last steps. Where the edge of each step is. Use tools that help you move around (mobility aids) if they are needed. These include: Canes. Walkers. Scooters. Crutches. Turn on the lights when you go into a dark area. Replace any light bulbs as soon as they burn out. Set up your furniture so you have a clear path. Avoid moving your furniture around. If any of your floors are uneven, fix them. If there are any pets around you, be aware of where they are. Review your medicines with your doctor. Some medicines can make you feel dizzy. This can increase your chance of falling. Ask your doctor what other things that you can do to help prevent falls. This information is not intended to replace advice given to you by your health care provider. Make sure you discuss any questions you have with your health care provider. Document Released: 02/12/2009 Document Revised: 09/24/2015 Document Reviewed: 05/23/2014 Elsevier Interactive Patient Education  2017 [employer].

## 2021-12-07 ENCOUNTER — Other Ambulatory Visit: Payer: Self-pay | Admitting: Cardiology

## 2021-12-07 NOTE — Telephone Encounter (Signed)
Rx request sent to pharmacy.  

## 2021-12-10 ENCOUNTER — Other Ambulatory Visit: Payer: Self-pay | Admitting: Cardiology

## 2021-12-10 DIAGNOSIS — I255 Ischemic cardiomyopathy: Secondary | ICD-10-CM

## 2021-12-10 NOTE — Telephone Encounter (Signed)
Rx(s) sent to pharmacy electronically.  

## 2021-12-13 DIAGNOSIS — U071 COVID-19: Secondary | ICD-10-CM | POA: Diagnosis not present

## 2021-12-13 DIAGNOSIS — R509 Fever, unspecified: Secondary | ICD-10-CM | POA: Diagnosis not present

## 2021-12-16 ENCOUNTER — Encounter (HOSPITAL_BASED_OUTPATIENT_CLINIC_OR_DEPARTMENT_OTHER): Payer: Self-pay

## 2021-12-17 MED ORDER — METOPROLOL SUCCINATE ER 25 MG PO TB24: 25.0000 mg | ORAL_TABLET | Freq: Two times a day (BID) | ORAL | 3 refills | Status: DC

## 2021-12-17 NOTE — Telephone Encounter (Signed)
Looks like patient was on succincate and an order for tartrate was in fact sent in, please confirm dose for metop tarte and I will reorder.

## 2022-01-04 ENCOUNTER — Other Ambulatory Visit: Payer: PPO

## 2022-01-04 ENCOUNTER — Ambulatory Visit: Payer: PPO | Admitting: Hematology and Oncology

## 2022-01-04 ENCOUNTER — Encounter: Payer: PPO | Admitting: Family Medicine

## 2022-01-07 NOTE — Progress Notes (Signed)
[facility])                                            [place] Team                                        Statin Quality Measure Assessment    01/07/2022  [name] Hall Oct 16, 1950 [id number]  Per review of chart and payor information, this patient has been flagged for non-adherence to the following CMS Quality Measure:   '[]'$  Statin Use in Persons with Diabetes  '[x]'$  Statin Use in Persons with Cardiovascular Disease  The ASCVD Risk score (Arnett DK, et al., 2019) failed to calculate for the following reasons:   The valid total cholesterol range is 130 to 320 mg/dL  Patient is failing [facility] CMS as a result of not being a statin. H/o myalgia d/t to statin and Repatha on file. Next appointment with PCP on 01/11/2022. If deemed clinically appropriate, please consider associating exclusion (see options) at the next appointment.   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

## 2022-01-11 ENCOUNTER — Encounter: Payer: Self-pay | Admitting: Family Medicine

## 2022-01-11 ENCOUNTER — Ambulatory Visit (INDEPENDENT_AMBULATORY_CARE_PROVIDER_SITE_OTHER): Payer: PPO | Admitting: Family Medicine

## 2022-01-11 VITALS — BP 103/56 | HR 54 | Temp 97.9°F | Ht 70.0 in | Wt 183.0 lb

## 2022-01-11 DIAGNOSIS — I10 Essential (primary) hypertension: Secondary | ICD-10-CM

## 2022-01-11 DIAGNOSIS — Z79899 Other long term (current) drug therapy: Secondary | ICD-10-CM

## 2022-01-11 DIAGNOSIS — N401 Enlarged prostate with lower urinary tract symptoms: Secondary | ICD-10-CM

## 2022-01-11 DIAGNOSIS — E782 Mixed hyperlipidemia: Secondary | ICD-10-CM

## 2022-01-11 DIAGNOSIS — Z0000 Encounter for general adult medical examination without abnormal findings: Secondary | ICD-10-CM

## 2022-01-11 DIAGNOSIS — D696 Thrombocytopenia, unspecified: Secondary | ICD-10-CM

## 2022-01-11 DIAGNOSIS — R351 Nocturia: Secondary | ICD-10-CM | POA: Diagnosis not present

## 2022-01-11 DIAGNOSIS — Z125 Encounter for screening for malignant neoplasm of prostate: Secondary | ICD-10-CM

## 2022-01-11 DIAGNOSIS — I5020 Unspecified systolic (congestive) heart failure: Secondary | ICD-10-CM

## 2022-01-11 DIAGNOSIS — N528 Other male erectile dysfunction: Secondary | ICD-10-CM | POA: Diagnosis not present

## 2022-01-11 DIAGNOSIS — Z1211 Encounter for screening for malignant neoplasm of colon: Secondary | ICD-10-CM | POA: Diagnosis not present

## 2022-01-11 DIAGNOSIS — E291 Testicular hypofunction: Secondary | ICD-10-CM

## 2022-01-11 LAB — CBC
HCT: 41.1 % (ref 39.0–52.0)
Hemoglobin: 13.6 g/dL (ref 13.0–17.0)
MCHC: 33.1 g/dL (ref 30.0–36.0)
MCV: 91.7 fl (ref 78.0–100.0)
Platelets: 114 10*3/uL — ABNORMAL LOW (ref 150.0–400.0)
RBC: 4.49 Mil/uL (ref 4.22–5.81)
RDW: 14.2 % (ref 11.5–15.5)
WBC: 3.6 10*3/uL — ABNORMAL LOW (ref 4.0–10.5)

## 2022-01-11 LAB — LIPID PANEL
Cholesterol: 93 mg/dL (ref 0–200)
HDL: 39.3 mg/dL (ref >39.00)
LDL Cholesterol: 38 mg/dL (ref 0–99)
NonHDL: 54.12
Total CHOL/HDL Ratio: 2
Triglycerides: 83 mg/dL (ref 0.0–149.0)
VLDL: 16.6 mg/dL (ref 0.0–40.0)

## 2022-01-11 LAB — COMPREHENSIVE METABOLIC PANEL
ALT: 9 U/L (ref 0–53)
AST: 16 U/L (ref 0–37)
Albumin: 3.9 g/dL (ref 3.5–5.2)
Alkaline Phosphatase: 101 U/L (ref 39–117)
BUN: 19 mg/dL (ref 6–23)
CO2: 25 mEq/L (ref 19–32)
Calcium: 9.1 mg/dL (ref 8.4–10.5)
Chloride: 103 mEq/L (ref 96–112)
Creatinine, Ser: 1.01 mg/dL (ref 0.40–1.50)
GFR: 75.2 mL/min (ref >60.00)
Glucose, Bld: 86 mg/dL (ref 70–99)
Potassium: 5.2 mEq/L — ABNORMAL HIGH (ref 3.5–5.1)
Sodium: 135 mEq/L (ref 135–145)
Total Bilirubin: 0.5 mg/dL (ref 0.2–1.2)
Total Protein: 6.6 g/dL (ref 6.0–8.3)

## 2022-01-11 LAB — HEMOGLOBIN A1C: Hgb A1c MFr Bld: 5.8 % (ref 4.6–6.5)

## 2022-01-11 LAB — PSA: PSA: 1.32 ng/mL (ref 0.10–4.00)

## 2022-01-11 LAB — TSH: TSH: 1.41 u[IU]/mL (ref 0.35–5.50)

## 2022-01-11 MED ORDER — TADALAFIL 5 MG PO TABS: 5.0000 mg | ORAL_TABLET | Freq: Every day | ORAL | 1 refills | Status: DC

## 2022-01-11 MED ORDER — TETANUS-DIPHTH-ACELL PERTUSSIS 5-2.5-18.5 LF-MCG/0.5 IM SUSP: 0.5000 mL | Freq: Once | INTRAMUSCULAR | 0 refills | Status: AC

## 2022-01-11 MED ORDER — SHINGRIX 50 MCG/0.5ML IM SUSR: 0.5000 mL | Freq: Once | INTRAMUSCULAR | 1 refills | Status: AC

## 2022-01-11 NOTE — Progress Notes (Signed)
Patient ID: [name] Hall, male  DOB: [birth year], 71 y.o.   MRN: [id number] Patient Care Team    Relationship Specialty Notifications Start End  Ma Hillock, DO PCP - General Family Medicine  07/20/21   Buford Dresser, MD Consulting Physician Cardiology  07/20/21   Benay Pike, MD Consulting Physician Hematology and Oncology  07/20/21   Thompson Grayer, MD Consulting Physician Cardiology  07/20/21   Danella Sensing, MD Consulting Physician Dermatology  07/20/21     Chief Complaint  Patient presents with   Annual Exam    Pt is fasting    Subjective:  [name] Hall is a 71 y.o. male present for CPE and chronic medical condition combination appointment All past medical history, surgical history, allergies, family history, immunizations, medications and social history were updated in the electronic medical record today. All recent labs, ED visits and hospitalizations within the last year were reviewed.  Health maintenance:  Colonoscopy: last screen "many years ago "he does not remember there being colon polyps removed.  He is electing to have Cologuard screening today.  He understands if positive then a colonoscopy would be recommended.  He has reservations about receiving anesthesia if not needed.   Immunizations:  tdap printed, influenza declined, PNA series completed, zostavax printed Infectious disease screening: Hep C completed.  PSA: No results found for: "PSA", pt was counseled on prostate cancer screenings.  Family history in his father. Assistive device: None Oxygen use: None* Patient has a Dental home. Hospitalizations/ED visits: Reviewed  BPH:He has a past medical history of BPH that is well controlled on Cialis 5 mg daily.  He receives printed prescriptions for his Cialis so that he can shop for cheapest price.  He states this medicine works very well for him.  He also feels it helps with his memory.  He is currently diagnosed as NYHA class I through his  cardiology team.  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  Followed closely by cardiology. Denies any negative side effects to medication, chest pain, dizziness or shortness of breath.     01/11/2022    9:38 AM 11/17/2021    3:04 PM 07/20/2021    1:32 PM  Depression screen PHQ 2/9  Decreased Interest 1 0 0  Down, Depressed, Hopeless 0 0 0  PHQ - 2 Score 1 0 0       No data to display           Current Exercise Habits: The patient does not participate in regular exercise at present Exercise limited by: Other - see comments    01/11/2022    9:35 AM 11/17/2021    3:07 PM 07/20/2021    1:32 PM  Fall Risk   Falls in the past year? 0 0 0  Number falls in past yr: 0 0 0  Injury with Fall? 0 0 0  Risk for fall due to :   No Fall Risks  Follow up Falls evaluation completed Falls prevention discussed Falls evaluation completed    Immunization History  Administered Date(s) Administered   Influenza Split 01/06/2012, 05/02/2017, 03/10/2020   Influenza, High Dose Seasonal PF 05/13/2017, 02/16/2018   Influenza,inj,Quad PF,6+ Mos 04/09/2013, 02/26/2015   Influenza-Unspecified 01/18/2012, 05/06/2017, 02/04/2019, 02/18/2021   PFIZER(Purple Top)SARS-COV-2 Vaccination 05/21/2019, 06/11/2019, 02/08/2020   Pneumococcal Conjugate-13 06/08/2017   Pneumococcal Polysaccharide-23 02/16/2018   Tdap 04/17/2007    Past Medical History:  Diagnosis Date   BPH (benign prostatic hyperplasia)  CAD (coronary artery disease)    Cardiac defibrillator in place    CHF (congestive heart failure) (HCC)    EF 58%   Complication of anesthesia    slow to wake after a surgery years ago ; but no issues with subsequent surgeries    Fibromyalgia    HLD (hyperlipidemia)    Hx of CABG    Hyperkalemia 04/16/2014   Hypertension    Ischemic cardiomyopathy    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 [facility])    Myocardial infarction ([place])    2012     Sleep apnea    no CPAP in use     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   Thrombocytopenia (HCC)    Unilateral primary osteoarthritis, left hip 12/18/2017   Unilateral primary osteoarthritis, right hip 12/18/2017   Vegan diet    Allergies  Allergen Reactions   Crestor [Rosuvastatin]     myalgias   Lipitor [Atorvastatin]     myalgias   Morphine And Related     Nausea   Other     VEGAN ( no dairy , no meat, no fish, no eggs);    Statins     Muscle pain   Past Surgical History:  Procedure Laterality Date   APPENDECTOMY     CARDIAC CATHETERIZATION  2012   X2 with 2 stents    CORONARY ARTERY BYPASS GRAFT  2007   quadruple    ICD IMPLANT  2012   JOINT REPLACEMENT  2019   Left Hip   partial discectomy   [place]  2003   lumbar    TOTAL HIP ARTHROPLASTY Left 02/15/2018   Procedure: LEFT TOTAL HIP ARTHROPLASTY ANTERIOR APPROACH;  Surgeon: Mcarthur Rossetti, MD;  Location: [facility];  Service: Orthopedics;  Laterality: Left;   Family History  Problem Relation Age of Onset   Breast cancer Mother    Depression Mother    Alcohol abuse Mother    Hypertension Father    Prostate cancer Father    Heart disease Father    Thyroid disease Sister    Social History   Social History Narrative   Marital status/children/pets: Married.   Education/employment: Printmaker.  Retired Scientist, research (physical sciences).   Safety:      -Wears a bicycle helmet riding a bike: Yes     -smoke alarm in the home:Yes     - wears seatbelt: Yes          Allergies as of 01/11/2022       Reactions   Crestor [rosuvastatin]    myalgias   Lipitor [atorvastatin]    myalgias   Morphine And Related    Nausea   Other    VEGAN ( no dairy , no meat, no fish, no eggs);    Statins    Muscle pain        Medication List        Accurate as of January 11, 2022 11:46 AM. If you have any questions, ask your nurse or doctor.          aspirin EC 81 MG tablet Take 81 mg by mouth daily.   lisinopril 5 MG  tablet Commonly known as: ZESTRIL Take 1 tablet (5 mg total) by mouth daily.   metoprolol succinate 25 MG 24 hr tablet Commonly known as: Toprol XL Take 1 tablet (25 mg total) by mouth in the morning and at bedtime.   Multi-Vitamins Tabs Take 1 tablet by mouth daily.  Repatha SureClick 401 MG/ML Soaj Generic drug: Evolocumab INJECT '140MG'$  UNDER THE SKIN EVERY 14 DAYS   Shingrix injection Generic drug: Zoster Vaccine Adjuvanted Inject 0.5 mLs into the muscle once for 1 dose. Started by: Howard Pouch, DO   tadalafil 5 MG tablet Commonly known as: CIALIS Take 1 tablet (5 mg total) by mouth daily.   Tdap 5-2.5-18.5 LF-MCG/0.5 injection Commonly known as: BOOSTRIX Inject 0.5 mLs into the muscle once for 1 dose. Started by: Howard Pouch, DO   VITAMIN B-12 PO Take 2,500 mcg by mouth every other day. As needed   Vitamin D3 50 MCG (2000 UT) Tabs Take 2,000 Units by mouth daily. As needed       All past medical history, surgical history, allergies, family history, immunizations andmedications were updated in the EMR today and reviewed under the history and medication portions of their EMR.     Recent Results (from the past 2160 hour(s))  CUP PACEART REMOTE DEVICE CHECK     Status: None   Collection Time: 10/27/21 12:23 PM  Result Value Ref Range   Date Time Interrogation Session [id number]    Pulse Generator Manufacturer BOST    Pulse Gen Model E140 ENERGEN VR    Pulse Gen Serial Number [id number]    Clinic Name [facility]    Implantable Pulse Generator Type Implantable Cardiac Defibulator    Implantable Pulse Generator Implant Date [id number]    Implantable Lead Manufacturer BOST    Implantable Lead Model [id number] [place] [employer] 4-Site SG    Implantable Lead Serial Number [id number]    Implantable Lead Implant Date [id number]    Implantable Lead Location Detail 1 UNKNOWN    Implantable Lead Location [phone]    Lead Channel Setting Sensing Sensitivity 0.6 mV   Lead Channel  Setting Sensing Adaptation Mode Adaptive Sensing    Lead Channel Setting Pacing Pulse Width 0.5 ms   Lead Channel Setting Pacing Amplitude 2.0 V   Lead Channel Impedance Value 446 ohm   Lead Channel Pacing Threshold Amplitude 0.8 V   Lead Channel Pacing Threshold Pulse Width 0.5 ms   HighPow Impedance 110 ohm   Battery Status BOS    Battery Remaining Longevity 42 mo   Battery Remaining Percentage 47 %   Brady Statistic RV Percent Paced 0 %    ROS 14 pt review of systems performed and negative (unless mentioned in an HPI)  Objective: BP (!) 103/56   Pulse (!) 54   Temp 97.9 F (36.6 C) (Oral)   Ht '5\' 10"'$  (1.778 m)   Wt 183 lb (83 kg)   SpO2 98%   BMI 26.26 kg/m  Physical Exam Constitutional:      General: He is not in acute distress.    Appearance: Normal appearance. He is not ill-appearing, toxic-appearing or diaphoretic.  HENT:     Head: Normocephalic and atraumatic.     Right Ear: Tympanic membrane, ear canal and external ear normal. There is no impacted cerumen.     Left Ear: Tympanic membrane, ear canal and external ear normal. There is no impacted cerumen.     Nose: Nose normal. No congestion or rhinorrhea.     Mouth/Throat:     Mouth: Mucous membranes are moist.     Pharynx: Oropharynx is clear. No oropharyngeal exudate or posterior oropharyngeal erythema.  Eyes:     General: No scleral icterus.       Right eye: No discharge.        Left eye: No  discharge.     Extraocular Movements: Extraocular movements intact.     Pupils: Pupils are equal, round, and reactive to light.  Cardiovascular:     Rate and Rhythm: Normal rate and regular rhythm.     Pulses: Normal pulses.     Heart sounds: Normal heart sounds. No murmur heard.    No friction rub. No gallop.  Pulmonary:     Effort: Pulmonary effort is normal. No respiratory distress.     Breath sounds: Normal breath sounds. No stridor. No wheezing, rhonchi or rales.  Chest:     Chest wall: No tenderness.   Abdominal:     General: Abdomen is flat. Bowel sounds are normal. There is no distension.     Palpations: Abdomen is soft. There is no mass.     Tenderness: There is no abdominal tenderness. There is no right CVA tenderness, left CVA tenderness, guarding or rebound.     Hernia: No hernia is present.  Musculoskeletal:        General: No swelling or tenderness. Normal range of motion.     Cervical back: Normal range of motion and neck supple.     Right lower leg: No edema.     Left lower leg: No edema.  Lymphadenopathy:     Cervical: No cervical adenopathy.  Skin:    General: Skin is warm and dry.     Coloration: Skin is not jaundiced.     Findings: No bruising, lesion or rash.  Neurological:     General: No focal deficit present.     Mental Status: He is alert and oriented to person, place, and time. Mental status is at baseline.     Cranial Nerves: No cranial nerve deficit.     Sensory: No sensory deficit.     Motor: No weakness.     Coordination: Coordination normal.     Gait: Gait normal.     Deep Tendon Reflexes: Reflexes normal.  Psychiatric:        Mood and Affect: Mood normal.        Behavior: Behavior normal.        Thought Content: Thought content normal.        Judgment: Judgment normal.     No results found.  Assessment/plan: [name] Hall is a 71 y.o. male present for [place] Essential hypertension/hyperlipidemia/NYHA class 1 heart failure with reduced ejection fraction (HCC)-20% Medications managed by cardiology. 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  Printed and handed prescription him today. NYHA class I heart failure patient.  He understands if his heart failure progresses, he has symptoms or he is in need of nitrates, he will need to discontinue Cialis  use. Encouraged him to discuss safety with his cardiology team  Encounter for long-term current use of medication - Hemoglobin A1c Prostate cancer screening - PSA Colon cancer screening - Cologuard  Routine general medical examination at a health care facility Colonoscopy: last screen "many years ago "he does not remember there being colon polyps removed.  He is electing to have Cologuard screening today.  He understands if positive then a colonoscopy would be recommended.  He has reservations about receiving anesthesia if not needed.   Immunizations:  tdap printed, influenza declined, PNA series completed, zostavax printed Infectious disease screening: Hep C completed. Patient was encouraged to exercise greater than 150  minutes a week. Patient was encouraged to choose a diet filled with fresh fruits and vegetables, and lean meats. AVS provided to patient today for education/recommendation on gender specific health and safety maintenance.  Return in about 24 weeks (around 06/28/2022) for Routine chronic condition follow-up.   Orders Placed This Encounter  Procedures   CBC   Comprehensive metabolic panel   Hemoglobin A1c   Lipid panel   PSA   TSH   Cologuard   Meds ordered this encounter  Medications   Tdap (BOOSTRIX) 5-2.5-18.5 LF-MCG/0.5 injection    Sig: Inject 0.5 mLs into the muscle once for 1 dose.    Dispense:  0.5 mL    Refill:  0   Zoster Vaccine Adjuvanted [facility]) injection    Sig: Inject 0.5 mLs into the muscle once for 1 dose.    Dispense:  1 each    Refill:  1   tadalafil (CIALIS) 5 MG tablet    Sig: Take 1 tablet (5 mg total) by mouth daily.    Dispense:  90 tablet    Refill:  1   Referral Orders  No referral(s) requested today     Note is dictated utilizing voice recognition software. Although note has been proof read prior to signing, occasional typographical errors still can be missed. If any questions arise, please do not hesitate to call for  verification.  Electronically signed by: Howard Pouch, DO [place]

## 2022-01-11 NOTE — Patient Instructions (Addendum)
Return in about 24 weeks (around 06/28/2022) for Routine chronic condition follow-up.        Great to see you today.  I have refilled the medication(s) we provide.   If labs were collected, we will inform you of lab results once received either by echart message or telephone call.   - echart message- for normal results that have been seen by the patient already.   - telephone call: abnormal results or if patient has not viewed results in their echart.  Health Maintenance, Male Adopting a healthy lifestyle and getting preventive care are important in promoting health and wellness. Ask your health care provider about: The right schedule for you to have regular tests and exams. Things you can do on your own to prevent diseases and keep yourself healthy. What should I know about diet, weight, and exercise? Eat a healthy diet  Eat a diet that includes plenty of vegetables, fruits, low-fat dairy products, and lean protein. Do not eat a lot of foods that are high in solid fats, added sugars, or sodium. Maintain a healthy weight Body mass index (BMI) is a measurement that can be used to identify possible weight problems. It estimates body fat based on height and weight. Your health care provider can help determine your BMI and help you achieve or maintain a healthy weight. Get regular exercise Get regular exercise. This is one of the most important things you can do for your health. Most adults should: Exercise for at least 150 minutes each week. The exercise should increase your heart rate and make you sweat (moderate-intensity exercise). Do strengthening exercises at least twice a week. This is in addition to the moderate-intensity exercise. Spend less time sitting. Even light physical activity can be beneficial. Watch cholesterol and blood lipids Have your blood tested for lipids and cholesterol at 71 years of age, then have this test every 5 years. You may need to have your cholesterol  levels checked more often if: Your lipid or cholesterol levels are high. You are older than 71 years of age. You are at high risk for heart disease. What should I know about cancer screening? Many types of cancers can be detected early and may often be prevented. Depending on your health history and family history, you may need to have cancer screening at various ages. This may include screening for: Colorectal cancer. Prostate cancer. Skin cancer. Lung cancer. What should I know about heart disease, diabetes, and high blood pressure? Blood pressure and heart disease High blood pressure causes heart disease and increases the risk of stroke. This is more likely to develop in people who have high blood pressure readings or are overweight. Talk with your health care provider about your target blood pressure readings. Have your blood pressure checked: Every 3-5 years if you are 26-43 years of age. Every year if you are 31 years old or older. If you are between the ages of 59 and 60 and are a current or former smoker, ask your health care provider if you should have a one-time screening for abdominal aortic aneurysm (AAA). Diabetes Have regular diabetes screenings. This checks your fasting blood sugar level. Have the screening done: Once every three years after age 50 if you are at a normal weight and have a low risk for diabetes. More often and at a younger age if you are overweight or have a high risk for diabetes. What should I know about preventing infection? Hepatitis B If you have a higher risk  for hepatitis B, you should be screened for this virus. Talk with your health care provider to find out if you are at risk for hepatitis B infection. Hepatitis C Blood testing is recommended for: Everyone born from [birth year] through 1965. Anyone with known risk factors for hepatitis C. Sexually transmitted infections (STIs) You should be screened each year for STIs, including gonorrhea and chlamydia,  if: You are sexually active and are younger than 71 years of age. You are older than 71 years of age and your health care provider tells you that you are at risk for this type of infection. Your sexual activity has changed since you were last screened, and you are at increased risk for chlamydia or gonorrhea. Ask your health care provider if you are at risk. Ask your health care provider about whether you are at high risk for HIV. Your health care provider may recommend a prescription medicine to help prevent HIV infection. If you choose to take medicine to prevent HIV, you should first get tested for HIV. You should then be tested every 3 months for as long as you are taking the medicine. Follow these instructions at home: Alcohol use Do not drink alcohol if your health care provider tells you not to drink. If you drink alcohol: Limit how much you have to 0-2 drinks a day. Know how much alcohol is in your drink. In the [place] one drink equals one 12 oz bottle of beer (355 mL), one 5 oz glass of wine (148 mL), or one 1 oz glass of hard liquor (44 mL). Lifestyle Do not use any products that contain nicotine or tobacco. These products include cigarettes, chewing tobacco, and vaping devices, such as e-cigarettes. If you need help quitting, ask your health care provider. Do not use street drugs. Do not share needles. Ask your health care provider for help if you need support or information about quitting drugs. General instructions Schedule regular health, dental, and eye exams. Stay current with your vaccines. Tell your health care provider if: You often feel depressed. You have ever been abused or do not feel safe at home. Summary Adopting a healthy lifestyle and getting preventive care are important in promoting health and wellness. Follow your health care provider's instructions about healthy diet, exercising, and getting tested or screened for diseases. Follow your health care provider's  instructions on monitoring your cholesterol and blood pressure. This information is not intended to replace advice given to you by your health care provider. Make sure you discuss any questions you have with your health care provider. Document Revised: 09/07/2020 Document Reviewed: 09/07/2020 Elsevier Patient Education  [place].

## 2022-01-12 ENCOUNTER — Encounter: Payer: Self-pay | Admitting: Family Medicine

## 2022-01-12 ENCOUNTER — Inpatient Hospital Stay: Payer: PPO

## 2022-01-12 ENCOUNTER — Inpatient Hospital Stay: Payer: PPO | Admitting: Hematology and Oncology

## 2022-01-12 ENCOUNTER — Telehealth: Payer: Self-pay | Admitting: Family Medicine

## 2022-01-12 DIAGNOSIS — D729 Disorder of white blood cells, unspecified: Secondary | ICD-10-CM

## 2022-01-12 NOTE — Telephone Encounter (Signed)
Attempted to contact pt and was unable to LVM 

## 2022-01-12 NOTE — Telephone Encounter (Signed)
Please inform patient: His liver and kidney function are normal. Cholesterol panel looks great and is at goal for him. A1c is a little higher from last year, but still in normal range with a normal fasting glucose.  Continue to consume healthy diet and exercise. Thyroid is functioning normal. PSA/prostate cancer screening is normal His potassium is very mildly elevated at 5.2 (5.1 is the highest end of normal).  Has had mildly elevated potassium intermittently throughout the last couple years.  I would encourage him to cut back on some of the potassium rich foods he may be eating: Bananas, oranges, cantaloupe, grapefruit ,prunes, raisins, dates,Cooked spinach., Cooked broccoli.,Potatoes.,Sweet potatoes,Mushrooms, Peas,Cucumbers, tomatoes are some common sources.  His blood cell counts have a very mild decrease in his total WBC 3.6.  Normal level started 4.0.  His platelets are very mildly decreased at 114.  This is a little lower than his usual but not by much to be concerned.    I would recommend he have a repeat CBC with differential in about 4 weeks, we can ensure his WBC counts go back to normal.  This can be a lab appointment only.

## 2022-01-13 NOTE — Telephone Encounter (Signed)
Spoke with pt regarding labs and instructions.   

## 2022-01-13 NOTE — Telephone Encounter (Signed)
Spoke with pt regarding labs and instructions. Pt declined sched lab appt would like to follow wit onc.

## 2022-01-13 NOTE — Telephone Encounter (Signed)
Pt decline sched lab appt.

## 2022-01-13 NOTE — Telephone Encounter (Signed)
Please advise if pt needs to call Onc sooner than appt

## 2022-01-18 DIAGNOSIS — Z1211 Encounter for screening for malignant neoplasm of colon: Secondary | ICD-10-CM | POA: Diagnosis not present

## 2022-01-19 ENCOUNTER — Other Ambulatory Visit: Payer: Self-pay | Admitting: *Deleted

## 2022-01-19 DIAGNOSIS — D759 Disease of blood and blood-forming organs, unspecified: Secondary | ICD-10-CM

## 2022-01-19 DIAGNOSIS — D696 Thrombocytopenia, unspecified: Secondary | ICD-10-CM

## 2022-01-20 ENCOUNTER — Inpatient Hospital Stay: Payer: PPO | Attending: Hematology and Oncology

## 2022-01-20 ENCOUNTER — Other Ambulatory Visit: Payer: Self-pay

## 2022-01-20 ENCOUNTER — Encounter: Payer: Self-pay | Admitting: Hematology and Oncology

## 2022-01-20 ENCOUNTER — Inpatient Hospital Stay: Payer: PPO | Admitting: Hematology and Oncology

## 2022-01-20 VITALS — BP 130/60 | HR 58 | Temp 97.7°F | Resp 18 | Ht 70.0 in | Wt 183.1 lb

## 2022-01-20 DIAGNOSIS — D72819 Decreased white blood cell count, unspecified: Secondary | ICD-10-CM | POA: Diagnosis not present

## 2022-01-20 DIAGNOSIS — D696 Thrombocytopenia, unspecified: Secondary | ICD-10-CM | POA: Insufficient documentation

## 2022-01-20 DIAGNOSIS — D759 Disease of blood and blood-forming organs, unspecified: Secondary | ICD-10-CM

## 2022-01-20 LAB — CBC WITH DIFFERENTIAL (CANCER CENTER ONLY)
Abs Immature Granulocytes: 0.01 10*3/uL (ref 0.00–0.07)
Basophils Absolute: 0 10*3/uL (ref 0.0–0.1)
Basophils Relative: 1 %
Eosinophils Absolute: 0.2 10*3/uL (ref 0.0–0.5)
Eosinophils Relative: 4 %
HCT: 40 % (ref 39.0–52.0)
Hemoglobin: 13.4 g/dL (ref 13.0–17.0)
Immature Granulocytes: 0 %
Lymphocytes Relative: 22 %
Lymphs Abs: 1 10*3/uL (ref 0.7–4.0)
MCH: 30.8 pg (ref 26.0–34.0)
MCHC: 33.5 g/dL (ref 30.0–36.0)
MCV: 92 fL (ref 80.0–100.0)
Monocytes Absolute: 0.5 10*3/uL (ref 0.1–1.0)
Monocytes Relative: 10 %
Neutro Abs: 3 10*3/uL (ref 1.7–7.7)
Neutrophils Relative %: 63 %
Platelet Count: 130 10*3/uL — ABNORMAL LOW (ref 150–400)
RBC: 4.35 MIL/uL (ref 4.22–5.81)
RDW: 13.8 % (ref 11.5–15.5)
WBC Count: 4.6 10*3/uL (ref 4.0–10.5)
nRBC: 0 % (ref 0.0–0.2)

## 2022-01-20 NOTE — Progress Notes (Signed)
[place] CONSULT NOTE  Patient Care Team: Ma Hillock, DO as PCP - General (Family Medicine) Buford Dresser, MD as Consulting Physician (Cardiology) Benay Pike, MD as Consulting Physician (Hematology and Oncology) Thompson Grayer, MD as Consulting Physician (Cardiology) Danella Sensing, MD as Consulting Physician (Dermatology)  CHIEF COMPLAINTS/PURPOSE OF CONSULTATION:  Cytopenia of unknown significance.  ASSESSMENT & PLAN:   This is a very pleasant 71 year old male patient with past medical history significant for coronary artery disease, congestive heart failure and ischemic cardiomyopathy referred to hematology for evaluation of thrombocytopenia.  I reviewed his Z61, folic acid, hepatitis C, TSH labs which were unremarkable Iron, ANA, hemolysis labs normal.  We have discussed that possible causes of thrombocytopenia are medications, bone marrow disorders or chronic ITP. During his last visit, we discussed about surveillance and he is here for a follow up Since last visit, he is doing quite well.  No concerning review of systems or physical examination findings.  CBC from today reviewed, stable thrombocytopenia.  Return to clinic in 6 months or sooner as needed.  Encouraged his healthy lifestyle.  Thank you for consulting [place] in the care of this patient.  Please do not hesitate to contact us with any additional questions or concerns  HISTORY OF PRESENTING ILLNESS:   [name] Hall 71 y.o. male is here because of Thrombocytopenia  This is a very pleasant 71 year old male patient with past medical history significant for coronary artery disease, congestive heart failure, hypertension, thrombocytopenia referred to hematology for thrombocytopenia.    Interval history Since last visit, he states he had COVID about 6 weeks ago and is slowly recovering.  He has fatigue from COVID otherwise no complaints.  No B symptoms.  No other infections.  Rest of the  pertinent 10 point ROS reviewed and negative  REVIEW OF SYSTEMS:   Constitutional: Denies fevers, chills or abnormal night sweats Eyes: Denies blurriness of vision, double vision or watery eyes Ears, nose, mouth, throat, and face: Denies mucositis or sore throat Respiratory: Denies cough, dyspnea or wheezes Cardiovascular: Denies palpitation, chest discomfort or lower extremity swelling Gastrointestinal:  Denies nausea, heartburn or change in bowel habits Skin: Denies abnormal skin rashes Lymphatics: Denies new lymphadenopathy or easy bruising Neurological:Denies numbness, tingling or new weaknesses Behavioral/Psych: Mood is stable, no new changes  All other systems were reviewed with the patient and are negative.  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 [facility])    Myocardial infarction ([place])    2012     Sleep apnea    no CPAP in use    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   Thrombocytopenia ([place])    Unilateral primary osteoarthritis, left hip 12/18/2017   Unilateral primary osteoarthritis, right hip 12/18/2017   Vegan diet     SURGICAL HISTORY: Past Surgical History:  Procedure Laterality Date   APPENDECTOMY     CARDIAC CATHETERIZATION  2012   X2 with 2  stents    CORONARY ARTERY BYPASS GRAFT  2007   quadruple    ICD IMPLANT  2012   JOINT REPLACEMENT  2019   Left Hip   partial discectomy   1995   SPINAL FUSION  2003   lumbar    TOTAL HIP ARTHROPLASTY Left 02/15/2018   Procedure: LEFT TOTAL HIP  ARTHROPLASTY ANTERIOR APPROACH;  Surgeon: Mcarthur Rossetti, MD;  Location: [facility];  Service: Orthopedics;  Laterality: Left;    SOCIAL HISTORY: Social History   Socioeconomic History   Marital status: Married    Spouse name: Not on file   Number of children: Not on file   Years of education: Not on file   Highest education level: Not on file  Occupational History   Not on file  Tobacco Use   Smoking status: Never    Passive exposure: Never   Smokeless tobacco: Never  Vaping Use   Vaping Use: Never used  Substance and Sexual Activity   Alcohol use: Not Currently    Comment: occ   Drug use: Never   Sexual activity: Yes    Partners: Female    Birth control/protection: None    Comment: married  Other Topics Concern   Not on file  Social History Narrative   Marital status/children/pets: Married.   Education/employment: Printmaker.  Retired Scientist, research (physical sciences).   Safety:      -Wears a bicycle helmet riding a bike: Yes     -smoke alarm in the home:Yes     - wears seatbelt: Yes         Social Determinants of Health   Financial Resource Strain: Low Risk  (11/17/2021)   Overall Financial Resource Strain (CARDIA)    Difficulty of Paying Living Expenses: Not hard at all  Food Insecurity: No Food Insecurity (11/17/2021)   Hunger Vital Sign    Worried About Running Out of Food in the Last Year: Never true    Ran Out of Food in the Last Year: Never true  Transportation Needs: No Transportation Needs (11/17/2021)   PRAPARE - Hydrologist (Medical): No    Lack of Transportation (Non-Medical): No  Physical Activity: Inactive (11/17/2021)   Exercise Vital Sign    Days of Exercise per Week: 0 days    Minutes of Exercise per Session: 0 min  Stress: No Stress Concern Present (11/17/2021)   [place]    Feeling of Stress : Not at all  Social Connections: Moderately Integrated (11/17/2021)    Social Connection and Isolation Panel [NHANES]    Frequency of Communication with Friends and Family: Once a week    Frequency of Social Gatherings with Friends and Family: More than three times a week    Attends Religious Services: Never    Marine scientist or Organizations: Yes    Attends Music therapist: More than 4 times per year    Marital Status: Married  Human resources officer Violence: Not At Risk (11/17/2021)   Humiliation, Afraid, Rape, and Kick questionnaire    Fear of Current or Ex-Partner: No    Emotionally Abused: No    Physically Abused: No    Sexually Abused: No    FAMILY HISTORY: Family History  Problem Relation Age of Onset   Breast cancer Mother    Depression Mother    Alcohol abuse Mother    Hypertension Father    Prostate cancer Father    Heart  disease Father    Thyroid disease Sister     ALLERGIES:  is allergic to crestor [rosuvastatin], lipitor [atorvastatin], morphine and related, other, and statins.  MEDICATIONS:  Current Outpatient Medications  Medication Sig Dispense Refill   aspirin EC 81 MG tablet Take 81 mg by mouth daily.     Cholecalciferol (VITAMIN D3) 2000 units TABS Take 2,000 Units by mouth daily. As needed     Cyanocobalamin (VITAMIN B-12 PO) Take 2,500 mcg by mouth every other day. As needed     lisinopril (ZESTRIL) 5 MG tablet Take 1 tablet (5 mg total) by mouth daily. 30 tablet 11   metoprolol succinate (TOPROL XL) 25 MG 24 hr tablet Take 1 tablet (25 mg total) by mouth in the morning and at bedtime. 180 tablet 3   Multiple Vitamin (MULTI-VITAMINS) TABS Take 1 tablet by mouth daily.      REPATHA SURECLICK 382 MG/ML SOAJ INJECT '140MG'$  UNDER THE SKIN EVERY 14 DAYS 2 mL 11   tadalafil (CIALIS) 5 MG tablet Take 1 tablet (5 mg total) by mouth daily. 90 tablet 1   No current facility-administered medications for this visit.     PHYSICAL EXAMINATION:  ECOG PERFORMANCE STATUS: 0 - Asymptomatic  Vitals:   01/20/22 0841   BP: 130/60  Pulse: (!) 58  Resp: 18  Temp: 97.7 F (36.5 C)  SpO2: 98%   Filed Weights   01/20/22 0841  Weight: 183 lb 1.6 oz (83.1 kg)   Physical Exam Constitutional:      Appearance: Normal appearance.  HENT:     Head: Normocephalic and atraumatic.  Cardiovascular:     Rate and Rhythm: Normal rate and regular rhythm.     Pulses: Normal pulses.     Heart sounds: Normal heart sounds.  Pulmonary:     Effort: Pulmonary effort is normal.     Breath sounds: Normal breath sounds.  Abdominal:     General: Abdomen is flat.     Palpations: Abdomen is soft.  Musculoskeletal:        General: Normal range of motion.  Skin:    General: Skin is warm and dry.  Neurological:     General: No focal deficit present.     Mental Status: He is alert.  Psychiatric:        Mood and Affect: Mood normal.      LABORATORY DATA:  I have reviewed the data as listed Lab Results  Component Value Date   WBC 4.6 01/20/2022   HGB 13.4 01/20/2022   HCT 40.0 01/20/2022   MCV 92.0 01/20/2022   PLT 130 (L) 01/20/2022     Chemistry      Component Value Date/Time   NA 135 01/11/2022 1001   NA 139 05/06/2020 0000   K 5.2 (H) 01/11/2022 1001   CL 103 01/11/2022 1001   CO2 25 01/11/2022 1001   BUN 19 01/11/2022 1001   BUN 11 05/06/2020 0000   CREATININE 1.01 01/11/2022 1001   CREATININE 0.95 07/02/2020 1232   GLU 78 05/06/2020 0000      Component Value Date/Time   CALCIUM 9.1 01/11/2022 1001   ALKPHOS 101 01/11/2022 1001   AST 16 01/11/2022 1001   AST 25 07/02/2020 1232   ALT 9 01/11/2022 1001   ALT 11 07/02/2020 1232   BILITOT 0.5 01/11/2022 1001   BILITOT 0.6 07/02/2020 1232     TSH of 1.8 B12 of 573 Folate of 18 WBC of 3.7 Hep C ab negative. Multiple  labs reviewed CBC ranging from 3700 to 4100, platelets mildly low in 120 K 2 yr ago, mild thrombocytopenia, but WBC Count was normal 9 yrs ago, again thrombocytopenia, plt count in 140K, no leukopenia Iron panel and ferritin  normal. No evidence of hemolysis  Labs from today with stable platelet count of 130000, no concerns.  RADIOGRAPHIC STUDIES: I have personally reviewed the radiological images as listed and agreed with the findings in the report. No results found.  All questions were answered. The patient knows to call the clinic with any problems, questions or concerns. I spent 20 minutes in the care of this patient including H and P, review of records, counseling and coordination of care.     Benay Pike, MD 01/20/2022 9:02 AM

## 2022-01-23 LAB — COLOGUARD: COLOGUARD: POSITIVE — AB

## 2022-01-24 ENCOUNTER — Telehealth: Payer: Self-pay | Admitting: Family Medicine

## 2022-01-24 DIAGNOSIS — R195 Other fecal abnormalities: Secondary | ICD-10-CM

## 2022-01-24 NOTE — Telephone Encounter (Signed)
Attempted to call pt. Unable to lvm 

## 2022-01-24 NOTE — Telephone Encounter (Signed)
Please inform patient his cologuard test is positive.   A positive result does not necessarily mean cancer. It means that Cologuard detected DNA and/or  biomarkers in the stool which are associated with colon cancer or precancer. Patients with a positive result should have a diagnostic colonoscopy. Not investigating further now, could result in missed opportunity to diagnose and treat colon cancer or precancer.   I have placed a referral to GI urgently

## 2022-01-25 NOTE — Telephone Encounter (Signed)
Spoke with pt regarding labs and instructions.   

## 2022-01-26 ENCOUNTER — Ambulatory Visit (INDEPENDENT_AMBULATORY_CARE_PROVIDER_SITE_OTHER): Payer: PPO

## 2022-01-26 DIAGNOSIS — I255 Ischemic cardiomyopathy: Secondary | ICD-10-CM | POA: Diagnosis not present

## 2022-01-26 LAB — CUP PACEART REMOTE DEVICE CHECK
Battery Remaining Longevity: 42 mo
Battery Remaining Percentage: 43 %
Brady Statistic RV Percent Paced: 0 %
Date Time Interrogation Session: 20230927062500
HighPow Impedance: 107 Ohm
Implantable Lead Implant Date: 20120913
Implantable Lead Location: 753860
Implantable Lead Model: 293
Implantable Lead Serial Number: 107958
Implantable Pulse Generator Implant Date: 20120913
Lead Channel Impedance Value: 421 Ohm
Lead Channel Pacing Threshold Amplitude: 0.8 V
Lead Channel Pacing Threshold Pulse Width: 0.5 ms
Lead Channel Setting Pacing Amplitude: 2 V
Lead Channel Setting Pacing Pulse Width: 0.5 ms
Lead Channel Setting Sensing Sensitivity: 0.6 mV
Pulse Gen Serial Number: 105130

## 2022-02-01 ENCOUNTER — Encounter: Payer: Self-pay | Admitting: Gastroenterology

## 2022-02-02 ENCOUNTER — Telehealth: Payer: Self-pay | Admitting: *Deleted

## 2022-02-02 NOTE — Telephone Encounter (Signed)
John please review pt. Chart and advise if can be done in [place] ?

## 2022-02-03 NOTE — Telephone Encounter (Signed)
Patient called states he was left a message his procedure is canceled would like to know why.

## 2022-02-03 NOTE — Telephone Encounter (Signed)
[name] Hall,  This pt's cardiac EF is less than 35% and his procedure will nee to be done at the hospital.  Thanks,  Osvaldo Angst

## 2022-02-03 NOTE — Telephone Encounter (Signed)
Dr. Lyndel Safe see Osvaldo Angst recommendations,would you like a direct to hospital or OV ?

## 2022-02-03 NOTE — Telephone Encounter (Signed)
Explained to pt. That the reason why pre-visit and procedure was cancelled is because he needs to be scheduled at the hospital for procedure per [facility] protocol and for his safety.made pt. Aware that the office will schedule his procedure and contact him depending on response from Dr. Alford Highland questions answered.

## 2022-02-03 NOTE — Progress Notes (Signed)
Remote ICD transmission.   

## 2022-02-07 ENCOUNTER — Other Ambulatory Visit: Payer: Self-pay | Admitting: Physician Assistant

## 2022-02-07 DIAGNOSIS — M4807 Spinal stenosis, lumbosacral region: Secondary | ICD-10-CM

## 2022-02-09 NOTE — Telephone Encounter (Signed)
Please do OV, then his procedure needs to be scheduled at [facility] long d/t low EF. RG

## 2022-02-14 NOTE — Telephone Encounter (Signed)
Pt. Sscheduled for OV with Dr. Lyndel Safe on 04/12/22 @ 1020 am

## 2022-02-14 NOTE — Telephone Encounter (Signed)
Attempted to call pt. To schedule ov ,no answer and unable to leave voicemail.

## 2022-02-15 ENCOUNTER — Encounter: Payer: Self-pay | Admitting: Family Medicine

## 2022-02-19 ENCOUNTER — Other Ambulatory Visit: Payer: Self-pay | Admitting: Cardiology

## 2022-02-21 NOTE — Telephone Encounter (Signed)
Rx(s) sent to pharmacy electronically.  

## 2022-03-15 ENCOUNTER — Encounter: Payer: PPO | Admitting: Gastroenterology

## 2022-03-28 ENCOUNTER — Other Ambulatory Visit (HOSPITAL_BASED_OUTPATIENT_CLINIC_OR_DEPARTMENT_OTHER): Payer: Self-pay

## 2022-03-28 MED ORDER — FLUAD QUADRIVALENT 0.5 ML IM PRSY
PREFILLED_SYRINGE | INTRAMUSCULAR | 0 refills | Status: DC
  Filled 2022-03-28: qty 0.5, 1d supply, fill #0

## 2022-04-12 ENCOUNTER — Encounter: Payer: Self-pay | Admitting: Gastroenterology

## 2022-04-12 ENCOUNTER — Ambulatory Visit (INDEPENDENT_AMBULATORY_CARE_PROVIDER_SITE_OTHER): Payer: PPO | Admitting: Gastroenterology

## 2022-04-12 VITALS — BP 118/70 | HR 54 | Ht 70.0 in | Wt 185.0 lb

## 2022-04-12 DIAGNOSIS — R195 Other fecal abnormalities: Secondary | ICD-10-CM

## 2022-04-12 NOTE — Progress Notes (Signed)
Chief Complaint:   Referring Provider:  Ma Hillock, DO      ASSESSMENT AND PLAN;   #1. + cologuard test  #2. CAD with history of ischemic cardiomyopathy (EF 20 to 25%) on bASA  Plan: -Colon at [facility] with miralax after cardiology clearence    Discussed risks & benefits of colonoscopy. Risks including rare perforation req laparotomy, bleeding after bx/polypectomy req blood transfusion, rarely missing neoplasms, risks of anesthesia/sedation, rare risk of damage to internal organs. Benefits outweigh the risks. Patient agrees to proceed. All the questions were answered. Pt consents to proceed.  HPI:    [name] Hall is a 71 y.o. male  With CAD with CHF (EF 20-25%, AICD), fibromyalgia, HLD with statin intolerance, sleep apnea, Myocardial Infarction (2012) s/p CABG x4, thrombocytopenia (no Liver problems, followed by hematology)   with positive Cologuard  No nausea, vomiting, heartburn, regurgitation, odynophagia or dysphagia.  No significant diarrhea or constipation.  No melena or hematochezia. No unintentional weight loss. No abdominal pain.  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[place])    Myocardial infarction ([place])    2012     Sleep apnea    no CPAP in use    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   Thrombocytopenia ([place])    Unilateral primary osteoarthritis, left hip 12/18/2017   Unilateral  primary osteoarthritis, right hip 12/18/2017   Vegan diet     Past Surgical History:  Procedure Laterality Date   APPENDECTOMY     CARDIAC CATHETERIZATION  2012   X2 with 2 stents    CORONARY ARTERY BYPASS GRAFT  2007   quadruple    ICD IMPLANT  2012   JOINT REPLACEMENT  2019   Left Hip   partial discectomy   1995   SPINAL FUSION  2003   lumbar    TOTAL HIP ARTHROPLASTY Left 02/15/2018   Procedure: LEFT TOTAL HIP ARTHROPLASTY ANTERIOR APPROACH;  Surgeon: Mcarthur Rossetti, MD;  Location: [facility];  Service: Orthopedics;  Laterality: Left;    Family History  Problem Relation Age of Onset   Breast cancer Mother    Depression Mother    Alcohol abuse Mother    Hypertension Father    Prostate cancer Father    Heart disease Father    Thyroid disease Sister     Social History   Tobacco Use   Smoking status: Never    Passive exposure: Never   Smokeless tobacco: Never  Vaping Use   Vaping Use: Never used  Substance Use Topics   Alcohol use: Not Currently    Comment: occ   Drug use: Never    Current Outpatient Medications  Medication Sig Dispense Refill   aspirin EC 81 MG tablet Take 81 mg by mouth daily.  Cholecalciferol (VITAMIN D3) 2000 units TABS Take 2,000 Units by mouth daily. As needed     Cyanocobalamin (VITAMIN B-12 PO) Take 2,500 mcg by mouth every other day. As needed     influenza vaccine adjuvanted (FLUAD QUADRIVALENT) 0.5 ML injection Inject into the muscle. 0.5 mL 0   lisinopril (ZESTRIL) 5 MG tablet TAKE ONE TABLET BY MOUTH DAILY 90 tablet 1   metoprolol succinate (TOPROL XL) 25 MG 24 hr tablet Take 1 tablet (25 mg total) by mouth in the morning and at bedtime. 180 tablet 3   Multiple Vitamin (MULTI-VITAMINS) TABS Take 1 tablet by mouth daily.      REPATHA SURECLICK 630 MG/ML SOAJ INJECT '140MG'$  UNDER THE SKIN EVERY 14 DAYS 2 mL 11   tadalafil (CIALIS) 5 MG tablet Take 1 tablet (5 mg total) by mouth daily. 90 tablet 1   No current  facility-administered medications for this visit.    Allergies  Allergen Reactions   Crestor [Rosuvastatin]     myalgias   Lipitor [Atorvastatin]     myalgias   Morphine And Related     Nausea   Other     VEGAN ( no dairy , no meat, no fish, no eggs);    Statins     Muscle pain    Review of Systems:  Constitutional: Denies fever, chills, diaphoresis, appetite change and fatigue.  HEENT: Denies photophobia, eye pain, redness, hearing loss, ear pain, congestion, sore throat, rhinorrhea, sneezing, mouth sores, neck pain, neck stiffness and tinnitus.   Respiratory: Denies SOB, DOE, cough, chest tightness,  and wheezing.   Cardiovascular: Denies chest pain, palpitations and leg swelling.  Genitourinary: Denies dysuria, urgency, frequency, hematuria, flank pain and difficulty urinating.  Musculoskeletal: Denies myalgias, back pain, joint swelling, arthralgias and gait problem.  Skin: No rash.  Neurological: Denies dizziness, seizures, syncope, weakness, light-headedness, numbness and headaches.  Hematological: Denies adenopathy. Easy bruising, personal or family bleeding history  Psychiatric/Behavioral: No anxiety or depression     Physical Exam:    BP 118/70   Pulse (!) 54   Ht '5\' 10"'$  (1.778 m)   Wt 185 lb (83.9 kg)   SpO2 100%   BMI 26.54 kg/m  Wt Readings from Last 3 Encounters:  04/12/22 185 lb (83.9 kg)  01/20/22 183 lb 1.6 oz (83.1 kg)  01/11/22 183 lb (83 kg)   Constitutional:  Well-developed, in no acute distress. Psychiatric: Normal mood and affect. Behavior is normal. HEENT: Pupils normal.  Conjunctivae are normal. No scleral icterus. Neck supple.  Cardiovascular: Normal rate, regular rhythm. No edema Pulmonary/chest: Effort normal and breath sounds normal. No wheezing, rales or rhonchi. Abdominal: Soft, nondistended. Nontender. Bowel sounds active throughout. There are no masses palpable. No hepatomegaly. Rectal: Deferred Neurological: Alert and oriented to  person place and time. Skin: Skin is warm and dry. No rashes noted.  Data Reviewed: I have personally reviewed following labs and imaging studies  CBC:    Latest Ref Rng & Units 01/20/2022    8:23 AM 01/11/2022   10:01 AM 07/01/2021    1:14 PM  CBC  WBC 4.0 - 10.5 K/uL 4.6  3.6  4.2   Hemoglobin 13.0 - 17.0 g/dL 13.4  13.6  14.7   Hematocrit 39.0 - 52.0 % 40.0  41.1  44.4   Platelets 150 - 400 K/uL 130  114.0  132     CMP:    Latest Ref Rng & Units 01/11/2022   10:01 AM 07/02/2020   12:32 PM  05/06/2020   12:00 AM  CMP  Glucose 70 - 99 mg/dL 86  94    BUN 6 - 23 mg/dL '19  9  11      '$ Creatinine 0.40 - 1.50 mg/dL 1.01  0.95  0.9      Sodium 135 - 145 mEq/L 135  136  139      Potassium 3.5 - 5.1 mEq/L 5.2  4.9  5.5      Chloride 96 - 112 mEq/L 103  105  105      CO2 19 - 32 mEq/L '25  24  28      '$ Calcium 8.4 - 10.5 mg/dL 9.1  9.0  9.9      Total Protein 6.0 - 8.3 g/dL 6.6  7.3    Total Bilirubin 0.2 - 1.2 mg/dL 0.5  0.6    Alkaline Phos 39 - 117 U/L 101  107  116      AST 0 - 37 U/L '16  25  16      '$ ALT 0 - 53 U/L '9  11  10         '$ This result is from an external source.       Carmell Austria, MD 04/12/2022, 10:38 AM  Cc: Ma Hillock, DO

## 2022-04-12 NOTE — Patient Instructions (Addendum)
_______________________________________________________  If you are age 71 or older, your body mass index should be between 23-30. Your Body mass index is 26.54 kg/m. If this is out of the aforementioned range listed, please consider follow up with your Primary Care Provider.  If you are age 22 or younger, your body mass index should be between 19-25. Your Body mass index is 26.54 kg/m. If this is out of the aformentioned range listed, please consider follow up with your Primary Care Provider.   ________________________________________________________  The [place] [facility] providers would like to encourage you to use [facility] to communicate with providers for non-urgent requests or questions.  Due to long hold times on the telephone, sending your provider a message by [facility] may be a faster and more efficient way to get a response.  Please allow 48 business hours for a response.  Please remember that this is for non-urgent requests.  _______________________________________________________  It has been recommended to you by your physician that you have a(n) Colon at [facility] completed. Per your request, we did not schedule the procedure(s) today. Please contact our office at [phone] after the holidays to see about March schedule.  You will be contacted by our office prior to your procedure for directions on your cardiac clearance.  If you do not hear from our office 2 week prior to your scheduled procedure, please call [phone] to discuss.   Thank you,  Dr. Jackquline Denmark   Thank you,  Dr. Jackquline Denmark

## 2022-04-27 ENCOUNTER — Ambulatory Visit (INDEPENDENT_AMBULATORY_CARE_PROVIDER_SITE_OTHER): Payer: PPO

## 2022-04-27 DIAGNOSIS — I255 Ischemic cardiomyopathy: Secondary | ICD-10-CM | POA: Diagnosis not present

## 2022-04-28 DIAGNOSIS — Z6826 Body mass index (BMI) 26.0-26.9, adult: Secondary | ICD-10-CM | POA: Diagnosis not present

## 2022-04-28 DIAGNOSIS — J0100 Acute maxillary sinusitis, unspecified: Secondary | ICD-10-CM | POA: Diagnosis not present

## 2022-04-28 LAB — CUP PACEART REMOTE DEVICE CHECK
Battery Remaining Longevity: 36 mo
Battery Remaining Percentage: 37 %
Brady Statistic RV Percent Paced: 0 %
Date Time Interrogation Session: 20231227134900
HighPow Impedance: 111 Ohm
Implantable Lead Connection Status: 753985
Implantable Lead Implant Date: 20120913
Implantable Lead Location: 753860
Implantable Lead Model: 293
Implantable Lead Serial Number: 107958
Implantable Pulse Generator Implant Date: 20120913
Lead Channel Impedance Value: 442 Ohm
Lead Channel Pacing Threshold Amplitude: 0.8 V
Lead Channel Pacing Threshold Pulse Width: 0.5 ms
Lead Channel Setting Pacing Amplitude: 2 V
Lead Channel Setting Pacing Pulse Width: 0.5 ms
Lead Channel Setting Sensing Sensitivity: 0.6 mV
Pulse Gen Serial Number: 105130
Zone Setting Status: 755011

## 2022-05-09 ENCOUNTER — Telehealth: Payer: Self-pay

## 2022-05-09 NOTE — Telephone Encounter (Signed)
LVM for patient to call back regarding scheduling a colon at [facility] possibly in March

## 2022-05-10 NOTE — Addendum Note (Signed)
Addended by: Curlene Labrum E on: 05/10/2022 01:44 PM   Modules accepted: Orders

## 2022-05-10 NOTE — Telephone Encounter (Signed)
LVM again for patient to call back 

## 2022-05-10 NOTE — Telephone Encounter (Signed)
Got in touch with patient and he said ok to 07-18-2022 and got the referral and scheduled it.  Instructions made and will call back tomorrow to go over the instructions.  Instructions mailed

## 2022-05-11 NOTE — Telephone Encounter (Signed)
LVM for patient to call back to go over his instructions

## 2022-05-16 ENCOUNTER — Telehealth: Payer: Self-pay

## 2022-05-16 NOTE — Telephone Encounter (Signed)
  Patient Consent for Virtual Visit         [name] Hall has provided verbal consent on 05/16/2022 for a virtual visit (video or telephone).   CONSENT FOR VIRTUAL VISIT FOR:  [name] Hall  By participating in this virtual visit I agree to the following:  I hereby voluntarily request, consent and authorize [facility] and its employed or contracted physicians, physician assistants, nurse practitioners or other licensed health care professionals (the Practitioner), to provide me with telemedicine health care services (the "Services") as deemed necessary by the treating Practitioner. I acknowledge and consent to receive the Services by the Practitioner via telemedicine. I understand that the telemedicine visit will involve communicating with the Practitioner through live audiovisual communication technology and the disclosure of certain medical information by electronic transmission. I acknowledge that I have been given the opportunity to request an in-person assessment or other available alternative prior to the telemedicine visit and am voluntarily participating in the telemedicine visit.  I understand that I have the right to withhold or withdraw my consent to the use of telemedicine in the course of my care at any time, without affecting my right to future care or treatment, and that the Practitioner or I may terminate the telemedicine visit at any time. I understand that I have the right to inspect all information obtained and/or recorded in the course of the telemedicine visit and may receive copies of available information for a reasonable fee.  I understand that some of the potential risks of receiving the Services via telemedicine include:  Delay or interruption in medical evaluation due to technological equipment failure or disruption; Information transmitted may not be sufficient (e.g. poor resolution of images) to allow for appropriate medical decision making by the  Practitioner; and/or  In rare instances, security protocols could fail, causing a breach of personal health information.  Furthermore, I acknowledge that it is my responsibility to provide information about my medical history, conditions and care that is complete and accurate to the Hall of my ability. I acknowledge that Practitioner's advice, recommendations, and/or decision may be based on factors not within their control, such as incomplete or inaccurate data provided by me or distortions of diagnostic images or specimens that may result from electronic transmissions. I understand that the practice of medicine is not an exact science and that Practitioner makes no warranties or guarantees regarding treatment outcomes. I acknowledge that a copy of this consent can be made available to me via my patient portal ([facility]), or I can request a printed copy by calling the office of [facility].    I understand that my insurance will be billed for this visit.   I have read or had this consent read to me. I understand the contents of this consent, which adequately explains the benefits and risks of the Services being provided via telemedicine.  I have been provided ample opportunity to ask questions regarding this consent and the Services and have had my questions answered to my satisfaction. I give my informed consent for the services to be provided through the use of telemedicine in my medical care

## 2022-05-16 NOTE — Telephone Encounter (Signed)
[facility] Pre-operative Risk Assessment     Request for surgical clearance:     Endoscopy Procedure  What type of surgery is being performed?     Colon at [facility]   When is this surgery scheduled?     07-18-2022  What type of clearance is required ?   Medical  Are there any medications that need to be held prior to surgery and how long? No medications to hold but we need to know if patient can have this procedure due to medical history  Practice name and name of physician performing surgery?      [facility]  What is your office phone and fax number?      Phone- [phone]  [phone]  Anesthesia type (None, local, MAC, general) ?       MAC

## 2022-05-16 NOTE — Telephone Encounter (Signed)
   Name: [name] Hall  DOB: 1950/05/17  MRN: [id number]  Primary Cardiologist: None   Preoperative team, please contact this patient and set up a phone call appointment for further preoperative risk assessment. Please obtain consent and complete medication review. Thank you for your help.  I confirm that guidance regarding antiplatelet and oral anticoagulation therapy has been completed and, if necessary, noted below.  Regarding ASA therapy, we recommend continuation of ASA throughout the perioperative period.  However, if the surgeon feels that cessation of ASA is required in the perioperative period, it may be stopped 5-7 days prior to surgery with a plan to resume it as soon as felt to be feasible from a surgical standpoint in the post-operative period.    Mable Fill, Marissa Nestle, NP 05/16/2022, 3:24 PM [place]

## 2022-05-16 NOTE — Telephone Encounter (Signed)
Patient agreeable with vitrual appointment. Consent given and med list reviewed.

## 2022-05-16 NOTE — Telephone Encounter (Signed)
LVM and spoke to wife and she said that he isnt up yet and will she will have him to give [place] a call when he gets up

## 2022-05-16 NOTE — Telephone Encounter (Signed)
Instructed patient and he said he will reach out to his cardiologist as well regarding if he should have this procedure or not. Told him as a vegan he can't have anything listed on paper but if its not on the paper he can have since he was asking

## 2022-05-17 NOTE — Progress Notes (Signed)
Remote ICD transmission.   

## 2022-06-23 DIAGNOSIS — D485 Neoplasm of uncertain behavior of skin: Secondary | ICD-10-CM | POA: Diagnosis not present

## 2022-06-23 DIAGNOSIS — L905 Scar conditions and fibrosis of skin: Secondary | ICD-10-CM | POA: Diagnosis not present

## 2022-06-23 DIAGNOSIS — L821 Other seborrheic keratosis: Secondary | ICD-10-CM | POA: Diagnosis not present

## 2022-06-23 DIAGNOSIS — D225 Melanocytic nevi of trunk: Secondary | ICD-10-CM | POA: Diagnosis not present

## 2022-06-23 DIAGNOSIS — Z85828 Personal history of other malignant neoplasm of skin: Secondary | ICD-10-CM | POA: Diagnosis not present

## 2022-06-23 DIAGNOSIS — D0461 Carcinoma in situ of skin of right upper limb, including shoulder: Secondary | ICD-10-CM | POA: Diagnosis not present

## 2022-06-23 DIAGNOSIS — L570 Actinic keratosis: Secondary | ICD-10-CM | POA: Diagnosis not present

## 2022-06-23 DIAGNOSIS — D0359 Melanoma in situ of other part of trunk: Secondary | ICD-10-CM | POA: Diagnosis not present

## 2022-06-27 DIAGNOSIS — T466X5A Adverse effect of antihyperlipidemic and antiarteriosclerotic drugs, initial encounter: Secondary | ICD-10-CM

## 2022-06-27 NOTE — Progress Notes (Signed)
[facility]) [place] Team Statin Quality Measure Assessment  06/27/2022  [name] Hall June 12, 1950 [id number]  Per review of chart and payor information, patient has a diagnosis of diabetes and cardiovascular disease but is not currently filling a statin prescription.  This places patient into the Statin Use in Patients with Cardiovascular Disease [facility]) measures for CMS.    Patient is failing [facility] CMS as a result of not being a statin. H/o myalgia d/t to statin and Repatha on file. Next appointment with PCP on 06/28/2022. If deemed clinically appropriate, please consider associating exclusion (see options) at the next appointment.      Component Value Date/Time   CHOL 93 01/11/2022 1001   CHOL 113 01/26/2021 1353   TRIG 83.0 01/11/2022 1001   HDL 39.30 01/11/2022 1001   HDL 43 01/26/2021 1353   CHOLHDL 2 01/11/2022 1001   VLDL 16.6 01/11/2022 1001   LDLCALC 38 01/11/2022 1001   LDLCALC 48 01/26/2021 1353   LDLDIRECT 92.3 11/01/2006 0914    Please consider ONE of the following recommendations:  Initiate high intensity statin Atorvastatin 40 mg once daily, #90, 3 refills   Rosuvastatin 20 mg once daily, #90, 3 refills    Initiate moderate intensity  statin with reduced frequency if prior  statin intolerance 1x weekly, #13, 3 refills   2x weekly, #26, 3 refills   3x weekly, #39, 3 refills    Code for past statin intolerance  (required annually)  Provider Requirements: Must associate code during an office visit or telehealth encounter   Drug Induced Myopathy G72.0   Myositis, unspecified M60.9   Myopathy, unspecified G72.9   Rhabdomyolysis  M62.82   Myalgia (SPC ONLY) 123XX123   Alcoholic cirrhosis of liver without ascites 0000000   Alcoholic cirrhosis of liver with ascites K70.31   Unspecified cirrhosis of liver K74.60   Toxic liver disease with fibrosis and cirrhosis of liver K71.7   Thank you for allowing [facility] pharmacy to be a part of this  patient's care.   Kristeen Miss, PharmD Clinical Pharmacist [place] Cell: [phone]

## 2022-06-28 ENCOUNTER — Encounter: Payer: Self-pay | Admitting: Physician Assistant

## 2022-06-28 ENCOUNTER — Ambulatory Visit (INDEPENDENT_AMBULATORY_CARE_PROVIDER_SITE_OTHER): Payer: PPO | Admitting: Family Medicine

## 2022-06-28 ENCOUNTER — Encounter: Payer: Self-pay | Admitting: Family Medicine

## 2022-06-28 ENCOUNTER — Ambulatory Visit: Payer: PPO | Attending: Cardiovascular Disease | Admitting: Physician Assistant

## 2022-06-28 VITALS — BP 100/62 | HR 49 | Temp 97.6°F | Wt 187.8 lb

## 2022-06-28 DIAGNOSIS — Z95810 Presence of automatic (implantable) cardiac defibrillator: Secondary | ICD-10-CM

## 2022-06-28 DIAGNOSIS — D696 Thrombocytopenia, unspecified: Secondary | ICD-10-CM

## 2022-06-28 DIAGNOSIS — I5020 Unspecified systolic (congestive) heart failure: Secondary | ICD-10-CM

## 2022-06-28 DIAGNOSIS — I255 Ischemic cardiomyopathy: Secondary | ICD-10-CM

## 2022-06-28 DIAGNOSIS — T466X5A Adverse effect of antihyperlipidemic and antiarteriosclerotic drugs, initial encounter: Secondary | ICD-10-CM | POA: Insufficient documentation

## 2022-06-28 DIAGNOSIS — R351 Nocturia: Secondary | ICD-10-CM

## 2022-06-28 DIAGNOSIS — E782 Mixed hyperlipidemia: Secondary | ICD-10-CM

## 2022-06-28 DIAGNOSIS — I5022 Chronic systolic (congestive) heart failure: Secondary | ICD-10-CM | POA: Diagnosis not present

## 2022-06-28 DIAGNOSIS — E291 Testicular hypofunction: Secondary | ICD-10-CM

## 2022-06-28 DIAGNOSIS — I2510 Atherosclerotic heart disease of native coronary artery without angina pectoris: Secondary | ICD-10-CM

## 2022-06-28 DIAGNOSIS — N401 Enlarged prostate with lower urinary tract symptoms: Secondary | ICD-10-CM | POA: Diagnosis not present

## 2022-06-28 DIAGNOSIS — G720 Drug-induced myopathy: Secondary | ICD-10-CM

## 2022-06-28 DIAGNOSIS — I10 Essential (primary) hypertension: Secondary | ICD-10-CM

## 2022-06-28 DIAGNOSIS — Z01810 Encounter for preprocedural cardiovascular examination: Secondary | ICD-10-CM | POA: Diagnosis not present

## 2022-06-28 DIAGNOSIS — Z951 Presence of aortocoronary bypass graft: Secondary | ICD-10-CM

## 2022-06-28 MED ORDER — TADALAFIL 5 MG PO TABS: 5.0000 mg | ORAL_TABLET | Freq: Every day | ORAL | 3 refills | Status: DC

## 2022-06-28 NOTE — Telephone Encounter (Signed)
Patient knows and is aware and has his instructions

## 2022-06-28 NOTE — Patient Instructions (Addendum)
Return in about 24 weeks (around 12/13/2022) for cpe (20 min).        Great to see you today.  I have refilled the medication(s) we provide.   If labs were collected, we will inform you of lab results once received either by echart message or telephone call.   - echart message- for normal results that have been seen by the patient already.   - telephone call: abnormal results or if patient has not viewed results in their echart.

## 2022-06-28 NOTE — Progress Notes (Signed)
Virtual Visit via Telephone Note   Because of [name] Hall's co-morbid illnesses, he is at least at moderate risk for complications without adequate follow up.  This format is felt to be most appropriate for this patient at this time.  The patient did not have access to video technology/had technical difficulties with video requiring transitioning to audio format only (telephone).  All issues noted in this document were discussed and addressed.  No physical exam could be performed with this format.  Please refer to the patient's chart for his consent to telehealth for [facility].  Evaluation Performed:  Preoperative cardiovascular risk assessment _____________   Date:  06/28/2022   Patient ID:  [name] Hall, DOB 1950-11-27, MRN [id number] Patient Location:  Home Provider location:   Office  Primary Care Provider:  Ma Hillock, DO Primary Cardiologist:  [name] Dresser, MD  Chief Complaint / Patient Profile   72 y.o. y/o male with a h/o CAD s/p prior CABG 2007, chronic HFrEF/ICM (last EF 20-25% in 03/2021), ICD followed by EP, HLD with statin intolerance, sleep apnea, prior LV thrombus 2019 resolved in 2022, mild thrombocytopenia who is pending colonoscopy and presents today for telephonic preoperative cardiovascular risk assessment.  History of Present Illness    [name] Hall is a 72 y.o. male who presents via audio/video conferencing for a telehealth visit today.  Pt was last seen in cardiology clinic on 09/09/21 by Dr. Rayann Heman.  At that time [name] Hall was doing well.  The patient is now pending procedure as outlined above. Since his last visit, he denies any new cardiac complaints. He confirms he is on ASA '81mg'$  daily.  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   Thrombocytopenia ([place])    Unilateral primary osteoarthritis, left hip 12/18/2017   Unilateral primary osteoarthritis, right hip 12/18/2017   Vegan diet    Past Surgical History:  Procedure Laterality Date   APPENDECTOMY     CARDIAC CATHETERIZATION  2012   X2 with 2 stents    CORONARY ARTERY BYPASS GRAFT  2007   quadruple    ICD IMPLANT  2012   JOINT REPLACEMENT  2019   Left Hip   partial discectomy   1995   SPINAL FUSION  2003   lumbar    TOTAL HIP ARTHROPLASTY Left 02/15/2018   Procedure: LEFT TOTAL HIP ARTHROPLASTY ANTERIOR APPROACH;  Surgeon: Mcarthur Rossetti, MD;  Location: [facility];  Service: Orthopedics;  Laterality: Left;    Allergies  Allergies  Allergen Reactions   Crestor [Rosuvastatin]     myalgias   Lipitor [Atorvastatin]     myalgias   Morphine And Related     Nausea   Other  VEGAN ( no dairy , no meat, no fish, no eggs);    Statins     Muscle pain    Home Medications    Prior to Admission medications   Medication Sig Start Date End Date Taking? Authorizing Provider  aspirin EC 81 MG tablet Take 81 mg by mouth daily.    [provider]  Cholecalciferol (VITAMIN D3) 2000 units TABS Take 2,000 Units by mouth daily. As needed    [provider]  Cyanocobalamin (VITAMIN B-12 PO) Take 2,500 mcg by mouth every other day. As needed    [provider]  lisinopril (ZESTRIL) 5 MG tablet TAKE ONE TABLET BY MOUTH DAILY 02/21/22   [name] Dresser, MD  Menaquinone-7 (VITAMIN K2) 100 MCG CAPS Take 1 tablet  by mouth as needed.    [provider]  metoprolol succinate (TOPROL XL) 25 MG 24 hr tablet Take 1 tablet (25 mg total) by mouth in the morning and at bedtime. 12/17/21   [name] Dresser, MD  Multiple Vitamin (MULTI-VITAMINS) TABS Take 1 tablet by mouth daily.     [provider]  REPATHA SURECLICK [id number] MG/ML SOAJ INJECT '140MG'$  UNDER THE SKIN EVERY 14 DAYS 12/07/21   [name] Dresser, MD  tadalafil (CIALIS) 5 MG tablet Take 1 tablet (5 mg total) by mouth daily. 01/11/22   Ma Hillock, DO    Physical Exam    Vital Signs:  [name] does not have vital signs available for review today.  Given telephonic nature of communication, physical exam is limited. AAOx3. NAD. Normal affect.  Speech and respirations are unlabored.  Accessory Clinical Findings    None  Assessment & Plan    1.  Preoperative Cardiovascular Risk Assessment: The patient affirms he has been doing well without any new cardiac symptoms. They are able to achieve over 4 METS without cardiac limitations. Therefore, based on ACC/AHA guidelines, the patient would be at acceptable risk for the planned procedure without further cardiovascular testing. The patient was advised that if he develops new symptoms prior to surgery to contact our office to arrange for a follow-up visit, and he verbalized understanding.  There are no specific medications listed to hold but patient is on aspirin. Given his cardiac history we generally do not suggest holding aspirin for colonoscopy.   A copy of this note will be routed to requesting surgeon.  Time:   Today, I have spent 5 minutes with the patient with telehealth technology discussing medical history, symptoms, and management plan.     Charlie Pitter, PA-C  06/28/2022, 9:59 AM

## 2022-06-28 NOTE — Progress Notes (Signed)
Patient ID: [name] Hall, male  DOB: May 17, 1950, 72 y.o.   MRN: [id number] Patient Care Team    Relationship Specialty Notifications Start End  [name] Hillock, DO PCP - General Family Medicine  07/20/21   [name] Dresser, MD PCP - Cardiology Cardiology  06/28/22   [name] Dresser, MD Consulting Physician Cardiology  07/20/21   [name] Pike, MD Consulting Physician Hematology and Oncology  07/20/21   [name] Grayer, MD (Inactive) Consulting Physician Cardiology  07/20/21   [name] Sensing, MD Consulting Physician Dermatology  07/20/21     Chief Complaint  Patient presents with   Benign Prostatic Hypertrophy    Subjective: [name] Hall is a 72 y.o. male present for follow-up on BPH. All past medical history, surgical history, allergies, family history, immunizations, medications and social history were updated in the electronic medical record today. All recent labs, ED visits and hospitalizations within the last year were reviewed.  BPH: Reports symptoms have been well-controlled with the use of  Cialis 5 mg daily.  He receives printed prescriptions for his Cialis so that he can shop for cheapest price.  He states this medicine works very well for him.  He also feels it helps with his memory.    He is currently diagnosed as NYHA class I through his cardiology team.  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  Followed closely by cardiology. Denies any negative side effects to medication, chest pain, dizziness or shortness of breath.  Documenting and statin myopathy for quality metrics.  It is well-documented in his intolerance list.  Cardiology manages this condition and patient is prescribed Repatha by cardiology.     01/11/2022    9:38 AM 11/17/2021    3:04 PM 07/20/2021    1:32 PM  Depression screen PHQ 2/9  Decreased Interest 1 0 0  Down, Depressed, Hopeless 0 0 0  PHQ - 2 Score 1 0 0       No data to display                   06/24/2022    3:59 PM 01/11/2022    9:35 AM 11/17/2021    3:07 PM 07/20/2021    1:32 PM  Fall Risk   Falls in the past year? 0 0 0 0  Number falls in past yr:  0 0 0  Injury with Fall?  0 0 0  Risk for fall due to :    No Fall Risks  Follow up  Falls evaluation completed Falls prevention discussed Falls evaluation completed    Immunization History  Administered Date(s) Administered   Fluad Quad(high Dose 65+) 03/28/2022   Influenza Split 01/06/2012, 05/02/2017, 03/10/2020   Influenza, High Dose Seasonal PF 05/13/2017, 02/16/2018   Influenza,inj,Quad PF,6+ Mos 04/09/2013, 02/26/2015   Influenza-Unspecified 01/18/2012, 05/06/2017, 02/04/2019, 02/18/2021   PFIZER(Purple Top)SARS-COV-2 Vaccination 05/21/2019, 06/11/2019, 02/08/2020   Pneumococcal Conjugate-13 06/08/2017   Pneumococcal Polysaccharide-23 02/16/2018   Tdap 04/17/2007, 02/14/2022    Past Medical History:  Diagnosis Date   BPH (benign prostatic hyperplasia)    CAD (coronary artery disease)    Cardiac defibrillator in place    CHF (congestive heart failure) (HCC)    EF 123456   Complication of anesthesia    slow to wake after a surgery years ago ; but no issues with subsequent surgeries    Fibromyalgia    HLD (hyperlipidemia)    Hx of CABG    Hyperkalemia  04/16/2014   Hypertension    Ischemic cardiomyopathy    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 [facility])    Myocardial infarction ([place])    2012     Sleep apnea    no CPAP in use    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   Thrombocytopenia (HCC)    Unilateral primary osteoarthritis, left hip 12/18/2017   Unilateral primary osteoarthritis, right hip 12/18/2017   Vegan diet    Allergies  Allergen Reactions   Crestor [Rosuvastatin]     myalgias   Lipitor [Atorvastatin]     myalgias   Morphine And Related     Nausea   Other     VEGAN ( no dairy , no meat, no fish, no eggs);    Statins     Muscle pain   Past Surgical History:   Procedure Laterality Date   APPENDECTOMY     CARDIAC CATHETERIZATION  2012   X2 with 2 stents    CORONARY ARTERY BYPASS GRAFT  2007   quadruple    ICD IMPLANT  2012   JOINT REPLACEMENT  2019   Left Hip   partial discectomy   [place]  2003   lumbar    TOTAL HIP ARTHROPLASTY Left 02/15/2018   Procedure: LEFT TOTAL HIP ARTHROPLASTY ANTERIOR APPROACH;  Surgeon: [name] Rossetti, MD;  Location: [facility];  Service: Orthopedics;  Laterality: Left;   Family History  Problem Relation Age of Onset   Breast cancer Mother    Depression Mother    Alcohol abuse Mother    Hypertension Father    Prostate cancer Father    Heart disease Father    Thyroid disease Sister    Social History   Social History Narrative   Marital status/children/pets: Married.   Education/employment: Printmaker.  Retired Scientist, research (physical sciences).   Safety:      -Wears a bicycle helmet riding a bike: Yes     -smoke alarm in the home:Yes     - wears seatbelt: Yes          Allergies as of 06/28/2022       Reactions   Crestor [rosuvastatin]    myalgias   Lipitor [atorvastatin]    myalgias   Morphine And Related    Nausea   Other    VEGAN ( no dairy , no meat, no fish, no eggs);    Statins    Muscle pain        Medication List        Accurate as of June 28, 2022  1:18 PM. If you have any questions, ask your nurse or doctor.          aspirin EC 81 MG tablet Take 81 mg by mouth daily.   lisinopril 5 MG tablet Commonly known as: ZESTRIL TAKE ONE TABLET BY MOUTH DAILY   metoprolol succinate 25 MG 24 hr tablet Commonly known as: Toprol XL Take 1 tablet (25 mg total) by mouth in the morning and at bedtime.   Multi-Vitamins Tabs Take 1 tablet by mouth daily.   Repatha SureClick [id number] MG/ML Soaj Generic drug: Evolocumab INJECT '140MG'$  UNDER THE SKIN EVERY 14 DAYS   tadalafil 5 MG tablet Commonly known as: CIALIS Take 1 tablet (5 mg total) by mouth daily.   VITAMIN B-12  PO Take 2,500 mcg by mouth every other day. As needed   Vitamin D3 50 MCG (2000 UT) Tabs Generic drug: Cholecalciferol Take 2,000 Units by  mouth daily. As needed   Vitamin K2 100 MCG Caps Take 1 tablet by mouth as needed.       All past medical history, surgical history, allergies, family history, immunizations andmedications were updated in the EMR today and reviewed under the history and medication portions of their EMR.      ROS 14 pt review of systems performed and negative (unless mentioned in an HPI)  Objective: BP 100/62   Pulse (!) 49   Temp 97.6 F (36.4 C)   Wt 187 lb 12.8 oz (85.2 kg)   SpO2 98%   BMI 26.95 kg/m  Physical Exam Vitals and nursing note reviewed.  Constitutional:      General: He is not in acute distress.    Appearance: Normal appearance. He is not ill-appearing, toxic-appearing or diaphoretic.  HENT:     Head: Normocephalic and atraumatic.  Eyes:     General: No scleral icterus.       Right eye: No discharge.        Left eye: No discharge.     Extraocular Movements: Extraocular movements intact.     Pupils: Pupils are equal, round, and reactive to light.  Cardiovascular:     Rate and Rhythm: Normal rate and regular rhythm.  Pulmonary:     Effort: Pulmonary effort is normal. No respiratory distress.     Breath sounds: Normal breath sounds. No wheezing, rhonchi or rales.  Musculoskeletal:     Right lower leg: No edema.     Left lower leg: No edema.  Skin:    General: Skin is warm.     Findings: No rash.  Neurological:     Mental Status: He is alert and oriented to person, place, and time. Mental status is at baseline.  Psychiatric:        Mood and Affect: Mood normal.        Behavior: Behavior normal.        Thought Content: Thought content normal.        Judgment: Judgment normal.     No results found.  Assessment/plan: [name] Hall is a 72 y.o. male present for routine chronic condition follow-up Essential  hypertension/hyperlipidemia/NYHA class 1 heart failure with reduced ejection fraction (HCC)-20%/statin myopathy All cardiac medications managed by cardiology. Labs: Up-to-date 12/2021  Thrombocytopenia [facility]) Labs up-to-date 12/2021  Male hypogonadism/male erectile dysfunction/Benign prostatic hyperplasia without lower urinary tract symptoms Stable New Cialis 5 mg daily.  Printed and handed prescription him today. NYHA class I heart failure patient.  He understands if his heart failure progresses, he has symptoms or he is in need of nitrates, he will need to discontinue Cialis use. Encouraged him to keep an open discussion with his cardiology team surrounding safety of this medication    Return in about 24 weeks (around 12/13/2022) for cpe (20 min).   No orders of the defined types were placed in this encounter.  Meds ordered this encounter  Medications   tadalafil (CIALIS) 5 MG tablet    Sig: Take 1 tablet (5 mg total) by mouth daily.    Dispense:  90 tablet    Refill:  3   Referral Orders  No referral(s) requested today     Note is dictated utilizing voice recognition software. Although note has been proof read prior to signing, occasional typographical errors still can be missed. If any questions arise, please do not hesitate to call for verification.  Electronically signed by: Howard Pouch, DO [place]

## 2022-06-29 ENCOUNTER — Encounter (HOSPITAL_BASED_OUTPATIENT_CLINIC_OR_DEPARTMENT_OTHER): Payer: Self-pay | Admitting: Cardiology

## 2022-06-29 ENCOUNTER — Ambulatory Visit (HOSPITAL_BASED_OUTPATIENT_CLINIC_OR_DEPARTMENT_OTHER): Payer: PPO | Admitting: Cardiology

## 2022-06-29 VITALS — BP 96/52 | HR 61 | Ht 70.0 in | Wt 187.4 lb

## 2022-06-29 DIAGNOSIS — E7800 Pure hypercholesterolemia, unspecified: Secondary | ICD-10-CM

## 2022-06-29 DIAGNOSIS — Z789 Other specified health status: Secondary | ICD-10-CM | POA: Diagnosis not present

## 2022-06-29 DIAGNOSIS — I236 Thrombosis of atrium, auricular appendage, and ventricle as current complications following acute myocardial infarction: Secondary | ICD-10-CM | POA: Diagnosis not present

## 2022-06-29 DIAGNOSIS — Z951 Presence of aortocoronary bypass graft: Secondary | ICD-10-CM | POA: Diagnosis not present

## 2022-06-29 DIAGNOSIS — I2510 Atherosclerotic heart disease of native coronary artery without angina pectoris: Secondary | ICD-10-CM | POA: Diagnosis not present

## 2022-06-29 DIAGNOSIS — I255 Ischemic cardiomyopathy: Secondary | ICD-10-CM

## 2022-06-29 NOTE — Patient Instructions (Signed)
Medication Instructions:  Your physician recommends that you continue on your current medications as directed. Please refer to the Current Medication list given to you today.  *If you need a refill on your cardiac medications before your next appointment, please call your pharmacy*  Lab Work: NONE  Testing/Procedures: NONE  Follow-Up: 01/04/2023 at 1:20 pm with Buford Dresser, MD

## 2022-06-29 NOTE — Progress Notes (Signed)
Cardiology Office Note:    Date:  06/29/2022   ID:  [name] Hall, DOB [birth year], MRN [id number]  PCP:  Ma Hillock, DO  Cardiologist:  Buford Dresser, MD  Referring MD: Ma Hillock, DO   CC: follow up  History of Present Illness:    [name] Hall is a 72 y.o. male with a hx of CAD, CHF, fibromyalgia, HLD with statin intolerance, sleep apnea, Myocardial Infarction (2012) s/p CABG x4, ischemic cardiomyopathy who is seen for follow-up. He was initially seen 12/29/20 as a new consult at the request of Kuneff, Renee A, DO for the evaluation and management of Ischemic cardiomyopathy.  CV history/risk factors: Exercise level: Walks a golf course/mows the lawn without exertional symptoms Current diet: Vegan diet for >10 years. 2019: LV thrombus on echo, placed on apixaban. Did not have immediate repeat study. We repeated in 09/2020, no thrombus, apixaban stopped. Repeat echo without thrombus.  At his last appointment he complained of low energy and back pain preventing him from exercising consistently. Since discontinuing Eliquis, he had been taking a full 325 mg aspirin and using an inversion table twice a week because he was nervous about thrombosis. The inversion also alleviated his back pain. Given some symptoms and bradycardia, we decreased metoprolol succinate to 25 mg BID.  Today, he is concerned about his upcoming colonoscopy on 07/18/22 and potential damage to his heart. About 20 years ago he reports having an adverse reaction to anesthesia. He was seen by Melina Copa, PA-C yesterday via telephone visit for preoperative clearance for his colonoscopy.  In clinic today his blood pressure is lower at 96/52. Normally he sees readings from [id number] systolic. Also he has seen his heart rates as low as the 40s sometimes. He denies any dizziness or lightheadedness. No syncope.  For months he has struggled with low energy which has been stable overall. Two weeks ago he returned  to the gym for exercise. He has been feeling a little stronger, but not necessarily with improved energy levels.  Sometimes he thinks he feels something in his chest, which could be a faster or slower heart rate. Nothing severe or bothersome.  He follows a vegan diet with less than 2000 calories a day. Current supplements include nattokinase, K2, D3, and magnesium.  He denies any chest pain, shortness of breath, or peripheral edema. No lightheadedness, headaches, syncope, orthopnea, or PND. No bleeding issues.    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   Thrombocytopenia ([place])    Unilateral primary osteoarthritis, left hip 12/18/2017   Unilateral primary osteoarthritis, right hip 12/18/2017   Vegan diet     Past Surgical History:  Procedure Laterality Date   APPENDECTOMY     CARDIAC CATHETERIZATION  2012  X2 with 2 stents    CORONARY ARTERY BYPASS GRAFT  2007   quadruple    ICD IMPLANT  2012   JOINT REPLACEMENT  2019   Left Hip   partial discectomy   1995   SPINAL FUSION  2003   lumbar    TOTAL HIP ARTHROPLASTY Left 02/15/2018   Procedure: LEFT TOTAL HIP ARTHROPLASTY ANTERIOR APPROACH;  Surgeon: Mcarthur Rossetti, MD;  Location: [facility];  Service: Orthopedics;  Laterality: Left;    Current Medications: Current Outpatient Medications on File Prior to Visit  Medication Sig   aspirin EC 81 MG  tablet Take 81 mg by mouth daily.   Cholecalciferol (VITAMIN D3) 2000 units TABS Take 2,000 Units by mouth daily. As needed   Cyanocobalamin (VITAMIN B-12 PO) Take 2,500 mcg by mouth every other day. As needed   lisinopril (ZESTRIL) 5 MG tablet TAKE ONE TABLET BY MOUTH DAILY   Menaquinone-7 (VITAMIN K2) 100 MCG CAPS Take 1 tablet by mouth as needed.   metoprolol succinate (TOPROL XL) 25 MG 24 hr tablet Take 1 tablet (25 mg total) by mouth in the morning and at bedtime.   Multiple Vitamin (MULTI-VITAMINS) TABS Take 1 tablet by mouth daily.    NATTOKINASE PO Take by mouth.   REPATHA SURECLICK [id number] MG/ML SOAJ INJECT '140MG'$  UNDER THE SKIN EVERY 14 DAYS   tadalafil (CIALIS) 5 MG tablet Take 1 tablet (5 mg total) by mouth daily.   No current facility-administered medications on file prior to visit.     Allergies:   Crestor [rosuvastatin], Lipitor [atorvastatin], Morphine and related, Other, and Statins   Social History   Tobacco Use   Smoking status: Never    Passive exposure: Never   Smokeless tobacco: Never  Vaping Use   Vaping Use: Never used  Substance Use Topics   Alcohol use: Not Currently    Comment: occ   Drug use: Never    Family History: family history includes Alcohol abuse in his mother; Breast cancer in his mother; Depression in his mother; Heart disease in his father; Hypertension in his father; Prostate cancer in his father; Thyroid disease in his sister.  ROS:   Please see the history of present illness.   (+) Fatigue Additional pertinent ROS otherwise unremarkable.  EKGs/Labs/Other Studies Reviewed:    The following studies were reviewed today:  Echo 03/09/21 1. With Definity contrast, the LV apex is seen to be akinetic. There is  no evidence of LV thrombus at this point but there is swirling of blood  and stagnant pooling of blood in the apex. . Left ventricular ejection  fraction, by estimation, is 20 to 25%.   The left ventricle has severely decreased  function. The left ventricle  demonstrates global hypokinesis. The left ventricular internal cavity size  was moderately to severely dilated. Left ventricular diastolic parameters  are consistent with Grade II diastolic dysfunction (pseudonormalization). Elevated left ventricular end-diastolic pressure. There is severe akinesis of the left ventricular, apical apical segment and anteroseptal wall.   2. Right ventricular systolic function is moderately reduced. The right  ventricular size is mildly enlarged.   3. Left atrial size was mild to moderately dilated.   4. The mitral valve is grossly normal. Mild mitral valve regurgitation.   5. The aortic valve is grossly normal. Aortic valve regurgitation is not  visualized.   Comparison(s): EF 25-30 %; The entire apex is akinetic. The anterior wall,  antero-lateral wall, anterior septum, inferior wall, posterior wall, mid  inferoseptal segment, and basal inferoseptal segment are hypokinetic. LV  thrombus excluded by echo contrast.   Echo 10/23/2020 1. LV thrombus excluded by echo contrast. Left ventricular ejection  fraction, by estimation, is 25 to 30%. The left ventricle has severely  decreased function. The left ventricle demonstrates regional wall motion  abnormalities (see scoring  diagram/findings for description). Left ventricular diastolic parameters  are indeterminate.   2. Right ventricular systolic function is normal. The right ventricular  size is normal.   3. The mitral valve is grossly normal. Trivial mitral valve  regurgitation. No evidence of mitral stenosis.   4. The aortic valve is tricuspid. There is mild calcification of the  aortic valve. Aortic valve regurgitation is not visualized. Mild aortic  valve sclerosis is present, with no evidence of aortic valve stenosis.   5. The inferior vena cava is normal in size with greater than 50%  respiratory variability, suggesting right atrial pressure of 3 mmHg.   Comparison(s):  Prior images unable to be directly viewed, comparison made  by report only. 11/09/17 EF 25-30%.   Conclusion(s)/Recommendation(s): No evidence of LV thrombus with use of  echo contrast. EF similar to prior report.   Echo TTE 11/09/2017: - Left ventricle: The cavity size was moderately dilated. Wall    thickness was increased in a pattern of mild LVH. Systolic    function was severely reduced. The estimated ejection fraction    was in the range of 25% to 30%. Diffuse hypokinesis. There is    akinesis of the inferolateral and apical myocardium. Doppler    parameters are consistent with abnormal left ventricular    relaxation (grade 1 diastolic dysfunction).   Impressions:   - Akinesis of the inferolateral and apical walls; overall severe LV    dysfunction; layered apical thrombus noted using definity; mild    diastolic dysfunction; moderate LVE; mild LVH; results discussed    with Derrill Kay MD.   EKG:  EKG is personally reviewed.   06/29/2022:  NSR at 61 bpm with PRWP, IVCD, low voltage limb leads 07/06/2021: Sinus bradycardia, rate 53 bpm, IVCD, low voltage limb leads 09/29/2020: Sinus bradycardia at 52 bpm, IVCD, low voltage limb leads, lateral TWI  Recent Labs: 01/11/2022: ALT 9; BUN 19; Creatinine, Ser 1.01; Potassium 5.2; Sodium 135; TSH 1.41 01/20/2022: Hemoglobin 13.4; Platelet Count 130   Recent Lipid Panel    Component Value Date/Time   CHOL 93 01/11/2022 1001   CHOL 113 01/26/2021 1353   TRIG 83.0 01/11/2022 1001   HDL 39.30 01/11/2022 1001   HDL 43 01/26/2021 1353   CHOLHDL 2 01/11/2022 1001   VLDL 16.6 01/11/2022 1001   LDLCALC 38 01/11/2022 1001   LDLCALC 48 01/26/2021 1353   LDLDIRECT 92.3 11/01/2006 0914    Physical Exam:    VS:  BP (!) 96/52 (BP Location: Left Arm, Patient Position: Sitting, Cuff Size: Normal)   Pulse 61   Ht '5\' 10"'$  (1.778 m)   Wt 187 lb 6.4 oz (85 kg)   BMI 26.89 kg/m     Wt Readings from Last 3 Encounters:  06/29/22 187 lb 6.4 oz  (85 kg)  06/28/22 187 lb 12.8 oz (85.2 kg)  04/12/22 185 lb (83.9 kg)    GEN: Well nourished, well developed in no acute distress HEENT: Normal, moist mucous membranes NECK: No JVD CARDIAC: regular rhythm, normal S1 and S2, no rubs or gallops. No murmur. VASCULAR: Radial and DP pulses 2+ bilaterally. No carotid bruits RESPIRATORY:  Clear to  auscultation without rales, wheezing or rhonchi  ABDOMEN: Soft, non-tender, non-distended MUSCULOSKELETAL:  Ambulates independently SKIN: Warm and dry, no edema NEUROLOGIC:  Alert and oriented x 3. No focal neuro deficits noted. PSYCHIATRIC:  Normal affect    ASSESSMENT:    1. Coronary artery disease involving native coronary artery of native heart without angina pectoris   2. Hx of CABG   3. Ischemic cardiomyopathy   4. Pure hypercholesterolemia   5. Statin intolerance   6. 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 He has added nattokinase on his own, discussed unknown about this, risk for bleeding -has not tolerated multiple statins. Tolerating PCSK9i. Last LDL 38 -NYHA class I -on metoprolol succinate 25 mg BID. Has ICD in place.  -on lisinopril 5 mg daily, borderline low BP at baseline. Did not tolerate spironolactone in the past due to hyperkalemia. No blood pressure room for entresto -had brain fog on farxiga, declines additional trial or trial of Jardiance, discussed today  History of LV thrombus 2019 -rechecked echo, no thrombus. Stopped anticoagulation, no thrombus on repeat echo  Cardiac risk counseling and prevention recommendations: -recommend heart healthy/Mediterranean diet, with whole grains, fruits, vegetable, fish, lean meats, nuts, and olive oil. Limit salt. -recommend moderate walking, 3-5 times/week for 30-50 minutes each session. Aim for at least  150 minutes.week. Goal should be pace of 3 miles/hours, or walking 1.5 miles in 30 minutes -recommend avoidance of tobacco products. Avoid excess alcohol.   Plan for follow up: 6 months or sooner as needed.   Buford Dresser, MD, PhD, [facility]    Medication Adjustments/Labs and Tests Ordered: Current medicines are reviewed at length with the patient today.  Concerns regarding medicines are outlined above.   Orders Placed This Encounter  Procedures   EKG 12-Lead   No orders of the defined types were placed in this encounter.  Patient Instructions  Medication Instructions:  Your physician recommends that you continue on your current medications as directed. Please refer to the Current Medication list given to you today.  *If you need a refill on your cardiac medications before your next appointment, please call your pharmacy*  Lab Work: NONE  Testing/Procedures: NONE  Follow-Up: 01/04/2023 at 1:20 pm with Buford Dresser, MD       [facility],acting as a scribe for Buford Dresser, MD.,have documented all relevant documentation on the behalf of Buford Dresser, MD,as directed by  Buford Dresser, MD while in the presence of Buford Dresser, MD.  I, Buford Dresser, MD, have reviewed all documentation for this visit. The documentation on 06/29/22 for the exam, diagnosis, procedures, and orders are all accurate and complete.   Signed, Buford Dresser, MD PhD 06/29/2022 8:07 PM    [place]

## 2022-07-06 DIAGNOSIS — D0359 Melanoma in situ of other part of trunk: Secondary | ICD-10-CM | POA: Diagnosis not present

## 2022-07-06 DIAGNOSIS — L988 Other specified disorders of the skin and subcutaneous tissue: Secondary | ICD-10-CM | POA: Diagnosis not present

## 2022-07-06 DIAGNOSIS — D485 Neoplasm of uncertain behavior of skin: Secondary | ICD-10-CM | POA: Diagnosis not present

## 2022-07-06 DIAGNOSIS — Z85828 Personal history of other malignant neoplasm of skin: Secondary | ICD-10-CM | POA: Diagnosis not present

## 2022-07-07 ENCOUNTER — Encounter: Payer: Self-pay | Admitting: Radiology

## 2022-07-11 ENCOUNTER — Encounter (HOSPITAL_COMMUNITY): Payer: Self-pay | Admitting: Gastroenterology

## 2022-07-11 NOTE — Progress Notes (Signed)
Attempted to obtain medical history via telephone, unable to reach at this time. HIPAA compliant voicemail message left requesting return call to pre surgical testing department. 

## 2022-07-18 ENCOUNTER — Ambulatory Visit (HOSPITAL_BASED_OUTPATIENT_CLINIC_OR_DEPARTMENT_OTHER): Payer: PPO | Admitting: Certified Registered"

## 2022-07-18 ENCOUNTER — Encounter (HOSPITAL_COMMUNITY): Payer: Self-pay | Admitting: Gastroenterology

## 2022-07-18 ENCOUNTER — Ambulatory Visit (HOSPITAL_COMMUNITY): Payer: PPO | Admitting: Certified Registered"

## 2022-07-18 ENCOUNTER — Ambulatory Visit (HOSPITAL_COMMUNITY)
Admission: RE | Admit: 2022-07-18 | Discharge: 2022-07-18 | Disposition: A | Discharge status: Home / Self Care | Payer: PPO | Attending: Gastroenterology | Admitting: Gastroenterology

## 2022-07-18 ENCOUNTER — Other Ambulatory Visit: Payer: Self-pay

## 2022-07-18 ENCOUNTER — Encounter (HOSPITAL_COMMUNITY)
Admission: RE | Disposition: A | Discharge status: Home / Self Care | Payer: Self-pay | Source: Home / Self Care | Attending: Gastroenterology

## 2022-07-18 DIAGNOSIS — G4730 Sleep apnea, unspecified: Secondary | ICD-10-CM | POA: Insufficient documentation

## 2022-07-18 DIAGNOSIS — K649 Unspecified hemorrhoids: Secondary | ICD-10-CM | POA: Diagnosis not present

## 2022-07-18 DIAGNOSIS — I509 Heart failure, unspecified: Secondary | ICD-10-CM | POA: Diagnosis not present

## 2022-07-18 DIAGNOSIS — R195 Other fecal abnormalities: Secondary | ICD-10-CM

## 2022-07-18 DIAGNOSIS — Z1211 Encounter for screening for malignant neoplasm of colon: Secondary | ICD-10-CM | POA: Diagnosis not present

## 2022-07-18 DIAGNOSIS — I252 Old myocardial infarction: Secondary | ICD-10-CM | POA: Insufficient documentation

## 2022-07-18 DIAGNOSIS — I110 Hypertensive heart disease with heart failure: Secondary | ICD-10-CM

## 2022-07-18 DIAGNOSIS — K635 Polyp of colon: Secondary | ICD-10-CM

## 2022-07-18 DIAGNOSIS — D124 Benign neoplasm of descending colon: Secondary | ICD-10-CM | POA: Insufficient documentation

## 2022-07-18 DIAGNOSIS — Z951 Presence of aortocoronary bypass graft: Secondary | ICD-10-CM | POA: Diagnosis not present

## 2022-07-18 DIAGNOSIS — I2510 Atherosclerotic heart disease of native coronary artery without angina pectoris: Secondary | ICD-10-CM | POA: Diagnosis not present

## 2022-07-18 DIAGNOSIS — D125 Benign neoplasm of sigmoid colon: Secondary | ICD-10-CM | POA: Diagnosis not present

## 2022-07-18 DIAGNOSIS — K648 Other hemorrhoids: Secondary | ICD-10-CM | POA: Diagnosis not present

## 2022-07-18 DIAGNOSIS — D123 Benign neoplasm of transverse colon: Secondary | ICD-10-CM | POA: Insufficient documentation

## 2022-07-18 HISTORY — PX: POLYPECTOMY: SHX5525

## 2022-07-18 HISTORY — PX: COLONOSCOPY WITH PROPOFOL: SHX5780

## 2022-07-18 SURGERY — COLONOSCOPY WITH PROPOFOL
Anesthesia: Monitor Anesthesia Care

## 2022-07-18 MED ORDER — LIDOCAINE 2% (20 MG/ML) 5 ML SYRINGE
INTRAMUSCULAR | Status: DC | PRN
  Administered 2022-07-18: 80 mg via INTRAVENOUS

## 2022-07-18 MED ORDER — PROPOFOL 500 MG/50ML IV EMUL
INTRAVENOUS | Status: DC | PRN
  Administered 2022-07-18: 125 ug/kg/min via INTRAVENOUS

## 2022-07-18 MED ORDER — EPHEDRINE SULFATE-NACL 50-0.9 MG/10ML-% IV SOSY
PREFILLED_SYRINGE | INTRAVENOUS | Status: DC | PRN
  Administered 2022-07-18 (×3): 5 mg via INTRAVENOUS

## 2022-07-18 MED ORDER — LACTATED RINGERS IV SOLN: INTRAVENOUS | Status: DC

## 2022-07-18 MED ORDER — PROPOFOL 10 MG/ML IV BOLUS
INTRAVENOUS | Status: DC | PRN
  Administered 2022-07-18: 20 mg via INTRAVENOUS
  Administered 2022-07-18: 30 mg via INTRAVENOUS

## 2022-07-18 MED ORDER — PROPOFOL 1000 MG/100ML IV EMUL
INTRAVENOUS | Status: AC
  Filled 2022-07-18: qty 100

## 2022-07-18 MED ORDER — SODIUM CHLORIDE 0.9 % IV SOLN: INTRAVENOUS | Status: DC

## 2022-07-18 SURGICAL SUPPLY — 22 items

## 2022-07-18 NOTE — Op Note (Signed)
[facility] Patient Name: [name] Hall Procedure Date: 07/18/2022 MRN: [id number] Attending MD: Jackquline Denmark , MD, [id number] Date of Birth: [birth year] CSN: [id number] Age: 72 Admit Type: Outpatient Procedure:                Colonoscopy Indications:              Positive Cologuard test Providers:                Jackquline Denmark, MD, Mikey College, RN, Brien Mates, Technician Referring MD:              Medicines:                Monitored Anesthesia Care Complications:            No immediate complications. Estimated Blood Loss:     Estimated blood loss: none. Procedure:                Pre-Anesthesia Assessment:                           - Prior to the procedure, a History and Physical                            was performed, and patient medications and                            allergies were reviewed. The patient's tolerance of                            previous anesthesia was also reviewed. The risks                            and benefits of the procedure and the sedation                            options and risks were discussed with the patient.                            All questions were answered, and informed consent                            was obtained. Prior Anticoagulants: The patient has                            taken no anticoagulant or antiplatelet agents. ASA                            Grade Assessment: III - A patient with severe                            systemic disease. After reviewing the risks and                            benefits,  the patient was deemed in satisfactory                            condition to undergo the procedure.                           After obtaining informed consent, the colonoscope                            was passed under direct vision. Throughout the                            procedure, the patient's blood pressure, pulse, and                            oxygen saturations  were monitored continuously. The                            [id number] Olympus colonoscope was                            introduced through the anus and advanced to the 2                            cm into the ileum. The colonoscopy was performed                            without difficulty. The patient tolerated the                            procedure well. The quality of the bowel                            preparation was good except in the cecum where                            there was solid stool. Aggressive suctioning                            aspiration was performed - overall the examination                            was adequate. The terminal ileum, ileocecal valve,                            appendiceal orifice, and rectum were photographed. Scope In: 10:22:06 AM Scope Out: 10:48:59 AM Scope Withdrawal Time: 0 hours 20 minutes 25 seconds  Total Procedure Duration: 0 hours 26 minutes 53 seconds  Findings:      An 8 mm polyp was found in the proximal transverse colon. The polyp was       sessile. The polyp was removed with a cold snare. Resection and       retrieval were complete.      Five sessile polyps were found in the proximal sigmoid colon,  mid       sigmoid colon and proximal descending colon(3). The polyps were 4 to 6       mm in size. These polyps were removed with a cold snare. Resection and       retrieval were complete.      Non-bleeding internal hemorrhoids were found during retroflexion and       during perianal exam. The hemorrhoids were moderate.      The terminal ileum appeared normal.      The exam was otherwise without abnormality on direct and retroflexion       views. Impression:               - One 8 mm polyp in the proximal transverse colon,                            removed with a cold snare. Resected and retrieved.                           - Five 4 to 6 mm polyps in the proximal sigmoid                            colon, in the mid  sigmoid colon and in the proximal                            descending colon, removed with a cold snare.                            Resected and retrieved.                           - Non-bleeding internal hemorrhoids.                           - The examined portion of the ileum was normal.                           - The examination was otherwise normal on direct                            and retroflexion views. Moderate Sedation:      Not Applicable - Patient had care per Anesthesia. Recommendation:           - Patient has a contact number available for                            emergencies. The signs and symptoms of potential                            delayed complications were discussed with the                            patient. Return to normal activities tomorrow.                            Written discharge instructions were provided to  the                            patient.                           - Resume previous diet.                           - Continue present medications.                           - Await pathology results.                           - Repeat colonoscopy for surveillance based on                            pathology results.                           - The findings and recommendations were discussed                            with the patient's family. Procedure Code(s):        --- Professional ---                           [phone], Colonoscopy, flexible; with removal of                            tumor(s), polyp(s), or other lesion(s) by snare                            technique Diagnosis Code(s):        --- Professional ---                           D12.3, Benign neoplasm of transverse colon (hepatic                            flexure or splenic flexure)                           D12.5, Benign neoplasm of sigmoid colon                           D12.4, Benign neoplasm of descending colon                           K64.8, Other hemorrhoids                            R19.5, Other fecal abnormalities CPT copyright 2022 [employer]. All rights reserved. The codes documented in this report are preliminary and upon coder review may  be revised to meet current compliance requirements. Jackquline Denmark, MD 07/18/2022 10:56:33 AM This report has been signed electronically. Number of Addenda: 0

## 2022-07-18 NOTE — H&P (Signed)
[facility] History and Physical   Primary Care Physician:  Ma Hillock, DO   Reason for Procedure:    positive Cologuard  Plan:    colon     HPI: [name] Hall is a 72 y.o. male  With CAD with CHF (EF 20-25%, AICD), fibromyalgia, HLD with statin intolerance, sleep apnea, Myocardial Infarction (2012) s/p CABG x4, thrombocytopenia (no Liver problems, followed by hematology)    with positive Cologuard  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[place])    Myocardial infarction ([place])    2012     Sleep apnea    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   Thrombocytopenia ([place])    Unilateral primary osteoarthritis, left hip 12/18/2017   Unilateral primary osteoarthritis, right hip 12/18/2017   Vegan diet     Past Surgical History:  Procedure Laterality Date   [place]  2012   X2 with 2 stents    CORONARY ARTERY BYPASS GRAFT  2007   quadruple    ICD IMPLANT  2012   JOINT REPLACEMENT  2019   Left Hip   partial discectomy   1995   SPINAL FUSION  2003   lumbar    TOTAL HIP ARTHROPLASTY Left 02/15/2018   Procedure: LEFT TOTAL HIP ARTHROPLASTY ANTERIOR APPROACH;  Surgeon: Mcarthur Rossetti, MD;  Location: [facility];  Service: Orthopedics;  Laterality: Left;    Prior to Admission medications   Medication Sig Start Date End Date Taking? Authorizing Provider  aspirin EC 81 MG tablet Take 81 mg by mouth daily.   Yes [provider]  Cholecalciferol 25 MCG (1000 UT) tablet  Take 1,000-2,000 Units by mouth every other day.   Yes [provider]  Cyanocobalamin (VITAMIN B-12 PO) Take 2,500 mcg by mouth every other day.   Yes [provider]  lisinopril (ZESTRIL) 5 MG tablet TAKE ONE TABLET BY MOUTH DAILY 02/21/22  Yes Buford Dresser, MD  metoprolol succinate (TOPROL XL) 25 MG 24 hr tablet Take 1 tablet (25 mg total) by mouth in the morning and at bedtime. 12/17/21  Yes Buford Dresser, MD  mupirocin ointment (BACTROBAN) 2 % SMARTSIG:1 Sparingly Topical Daily 07/06/22  Yes [provider]  NATTOKINASE PO Take by mouth.   Yes [provider]  REPATHA SURECLICK [id number] MG/ML SOAJ INJECT 140MG  UNDER THE SKIN EVERY 14 DAYS 12/07/21  Yes Buford Dresser, MD  Vitamin D-Vitamin K (VITAMIN K2-VITAMIN D3 PO) Take 1 tablet by mouth every other day.   Yes [provider]  Multiple Vitamin (MULTI-VITAMINS) TABS Take 1 tablet by mouth daily.     [provider]  OVER THE COUNTER MEDICATION Take 1 tablet by mouth daily. NMN    [provider]  tadalafil (CIALIS) 5 MG tablet Take 1 tablet (5 mg total) by mouth daily. 06/28/22   Kuneff, Renee A, DO    Current Facility-Administered Medications  Medication Dose Route  Frequency Provider Last Rate Last Admin   0.9 %  sodium chloride infusion   Intravenous Continuous Jackquline Denmark, MD       lactated ringers infusion   Intravenous Continuous Jackquline Denmark, MD 50 mL/hr at 07/18/22 1008 New Bag at 07/18/22 1008    Allergies as of 05/10/2022 - Review Complete 04/12/2022  Allergen Reaction Noted   Crestor [rosuvastatin]  10/22/2020   Lipitor [atorvastatin]  10/22/2020   Morphine and related  05/06/2020   Other  02/08/2018   Statins  05/06/2020    Family History  Problem Relation Age of Onset   Breast cancer Mother    Depression Mother    Alcohol abuse Mother    Hypertension Father    Prostate cancer Father    Heart disease Father    Thyroid disease Sister      Social History   Socioeconomic History   Marital status: Married    Spouse name: Not on file   Number of children: Not on file   Years of education: Not on file   Highest education level: Some college, no degree  Occupational History   Not on file  Tobacco Use   Smoking status: Never    Passive exposure: Never   Smokeless tobacco: Never  Vaping Use   Vaping Use: Never used  Substance and Sexual Activity   Alcohol use: Not Currently    Comment: occ   Drug use: Never   Sexual activity: Yes    Partners: Female    Birth control/protection: None    Comment: married  Other Topics Concern   Not on file  Social History Narrative   Marital status/children/pets: Married.   Education/employment: Printmaker.  Retired Scientist, research (physical sciences).   Safety:      -Wears a bicycle helmet riding a bike: Yes     -smoke alarm in the home:Yes     - wears seatbelt: Yes         Social Determinants of Health   Financial Resource Strain: Low Risk  (06/24/2022)   Overall Financial Resource Strain (CARDIA)    Difficulty of Paying Living Expenses: Not very hard  Food Insecurity: No Food Insecurity (06/24/2022)   Hunger Vital Sign    Worried About Running Out of Food in the Last Year: Never true    Ran Out of Food in the Last Year: Never true  Transportation Needs: No Transportation Needs (06/24/2022)   PRAPARE - Hydrologist (Medical): No    Lack of Transportation (Non-Medical): No  Physical Activity: Insufficiently Active (06/24/2022)   Exercise Vital Sign    Days of Exercise per Week: 2 days    Minutes of Exercise per Session: 30 min  Stress: No Stress Concern Present (06/24/2022)   [place]    Feeling of Stress : Only a little  Social Connections: Moderately Isolated (06/24/2022)   Social Connection and Isolation Panel [NHANES]    Frequency of Communication with Friends and Family: Once a week     Frequency of Social Gatherings with Friends and Family: Once a week    Attends Religious Services: Never    Marine scientist or Organizations: No    Attends Music therapist: More than 4 times per year    Marital Status: Married  Human resources officer Violence: Not At Risk (11/17/2021)   Humiliation, Afraid, Rape, and Kick questionnaire    Fear of Current or Ex-Partner: No  Emotionally Abused: No    Physically Abused: No    Sexually Abused: No    Review of Systems: Positive for none All other review of systems negative except as mentioned in the HPI.  Physical Exam: Vital signs in last 24 hours: Temp:  [97.5 F (36.4 C)] 97.5 F (36.4 C) (03/18 0954) Pulse Rate:  [54] 54 (03/18 0954) Resp:  [9] 9 (03/18 0954) BP: (123)/(59) 123/59 (03/18 0954) SpO2:  [100 %] 100 % (03/18 0954) Weight:  [84.8 kg] 84.8 kg (03/18 0954)   General:   Alert,  Well-developed, well-nourished, pleasant and cooperative in NAD Lungs:  Clear throughout to auscultation.   Heart:  Regular rate and rhythm; no murmurs, clicks, rubs,  or gallops. Abdomen:  Soft, nontender and nondistended. Normal bowel sounds.   Neuro/Psych:  Alert and cooperative. Normal mood and affect. A and O x 3    No significant changes were identified.  The patient continues to be an appropriate candidate for the planned procedure and anesthesia.   Carmell Austria, MD. [facility] 07/18/2022 10:09 AM@

## 2022-07-18 NOTE — Transfer of Care (Signed)
Immediate Anesthesia Transfer of Care Note  Patient: [name] Hall  Procedure(s) Performed: COLONOSCOPY WITH PROPOFOL POLYPECTOMY  Patient Location: PACU  Anesthesia Type:MAC  Level of Consciousness: awake, alert , and oriented  Airway & Oxygen Therapy: Patient Spontanous Breathing and Patient connected to face mask oxygen  Post-op Assessment: Report given to RN and Post -op Vital signs reviewed and stable  Post vital signs: Reviewed and stable  Last Vitals:  Vitals Value Taken Time  BP    Temp    Pulse 69 07/18/22 1058  Resp 9 07/18/22 1058  SpO2 100 % 07/18/22 1058  Vitals shown include unvalidated device data.  Last Pain:  Vitals:   07/18/22 0954  TempSrc: Temporal  PainSc: 0-No pain         Complications: No notable events documented.

## 2022-07-18 NOTE — Discharge Instructions (Signed)
YOU HAD AN ENDOSCOPIC PROCEDURE TODAY: Refer to the procedure report and other information in the discharge instructions given to you for any specific questions about what was found during the examination. If this information does not answer your questions, please call [place] office at [phone] to clarify.  ° °YOU SHOULD EXPECT: Some feelings of bloating in the abdomen. Passage of more gas than usual. Walking can help get rid of the air that was put into your GI tract during the procedure and reduce the bloating. If you had a lower endoscopy (such as a colonoscopy or flexible sigmoidoscopy) you may notice spotting of blood in your stool or on the toilet paper. Some abdominal soreness may be present for a day or two, also. ° °DIET: Your first meal following the procedure should be a light meal and then it is ok to progress to your normal diet. A half-sandwich or bowl of soup is an example of a good first meal. Heavy or fried foods are harder to digest and may make you feel nauseous or bloated. Drink plenty of fluids but you should avoid alcoholic beverages for 24 hours. If you had a esophageal dilation, please see attached instructions for diet.   ° °ACTIVITY: Your care partner should take you home directly after the procedure. You should plan to take it easy, moving slowly for the rest of the day. You can resume normal activity the day after the procedure however YOU SHOULD NOT DRIVE, use power tools, machinery or perform tasks that involve climbing or major physical exertion for 24 hours (because of the sedation medicines used during the test).  ° °SYMPTOMS TO REPORT IMMEDIATELY: °A gastroenterologist can be reached at any hour. Please call [phone]  for any of the following symptoms:  °Following lower endoscopy (colonoscopy, flexible sigmoidoscopy) °Excessive amounts of blood in the stool  °Significant tenderness, worsening of abdominal pains  °Swelling of the abdomen that is new, acute  °Fever of 100° or  higher  °Following upper endoscopy (EGD, EUS, ERCP, esophageal dilation) °Vomiting of blood or coffee ground material  °New, significant abdominal pain  °New, significant chest pain or pain under the shoulder blades  °Painful or persistently difficult swallowing  °New shortness of breath  °Black, tarry-looking or red, bloody stools ° °FOLLOW UP:  °If any biopsies were taken you will be contacted by phone or by letter within the next 1-3 weeks. Call [phone]  if you have not heard about the biopsies in 3 weeks.  °Please also call with any specific questions about appointments or follow up tests. ° °

## 2022-07-18 NOTE — Anesthesia Preprocedure Evaluation (Signed)
Anesthesia Evaluation  Patient identified by MRN, date of birth, ID band Patient awake    Reviewed: Allergy & Precautions, NPO status , Patient's Chart, lab work & pertinent test results, reviewed documented beta blocker date and time   History of Anesthesia Complications (+) PROLONGED EMERGENCE and history of anesthetic complications  Airway Mallampati: II  TM Distance: >3 FB Neck ROM: Full    Dental  (+) Teeth Intact, Dental Advisory Given   Pulmonary sleep apnea    Pulmonary exam normal breath sounds clear to auscultation       Cardiovascular hypertension, Pt. on home beta blockers and Pt. on medications + CAD, + Past MI, + CABG and +CHF  Normal cardiovascular exam(-) dysrhythmias + Cardiac Defibrillator  Rhythm:Regular Rate:Normal  Echo 03/09/21: 1. With Definity contrast, the LV apex is seen to be akinetic. There is  no evidence of LV thrombus at this point but there is swirling of blood  and stagnant pooling of blood in the apex. . Left ventricular ejection  fraction, by estimation, is 20 to 25%.   The left ventricle has severely decreased function. The left ventricle  demonstrates global hypokinesis. The left ventricular internal cavity size  was moderately to severely dilated. Left ventricular diastolic parameters  are consistent with Grade II  diastolic dysfunction (pseudonormalization). Elevated left ventricular  end-diastolic pressure. There is severe akinesis of the left ventricular,  apical apical segment and anteroseptal wall.   2. Right ventricular systolic function is moderately reduced. The right  ventricular size is mildly enlarged.   3. Left atrial size was mild to moderately dilated.   4. The mitral valve is grossly normal. Mild mitral valve regurgitation.   5. The aortic valve is grossly normal. Aortic valve regurgitation is not  visualized.     Neuro/Psych  Neuromuscular disease     GI/Hepatic negative GI ROS, Neg liver ROS,,,  Endo/Other  negative endocrine ROS    Renal/GU negative Renal ROS     Musculoskeletal  (+) Arthritis ,  Fibromyalgia -  Abdominal   Peds  Hematology negative hematology ROS (+)   Anesthesia Other Findings Day of surgery medications reviewed with the patient.  Reproductive/Obstetrics                              Anesthesia Physical Anesthesia Plan  ASA: 4  Anesthesia Plan: MAC   Post-op Pain Management: Minimal or no pain anticipated   Induction: Intravenous  PONV Risk Score and Plan: 1 and TIVA and Treatment may vary due to age or medical condition  Airway Management Planned: Natural Airway and Simple Face Mask  Additional Equipment:   Intra-op Plan:   Post-operative Plan:   Informed Consent: I have reviewed the patients History and Physical, chart, labs and discussed the procedure including the risks, benefits and alternatives for the proposed anesthesia with the patient or authorized representative who has indicated his/her understanding and acceptance.     Dental advisory given  Plan Discussed with: CRNA and Anesthesiologist  Anesthesia Plan Comments:          Anesthesia Quick Evaluation

## 2022-07-19 ENCOUNTER — Encounter (HOSPITAL_COMMUNITY): Payer: Self-pay | Admitting: Gastroenterology

## 2022-07-19 LAB — SURGICAL PATHOLOGY

## 2022-07-19 NOTE — Anesthesia Postprocedure Evaluation (Signed)
Anesthesia Post Note  Patient: [name]  Procedure(s) Performed: COLONOSCOPY WITH PROPOFOL POLYPECTOMY     Patient location during evaluation: Endoscopy Anesthesia Type: MAC Level of consciousness: oriented, awake and alert and awake Pain management: pain level controlled Vital Signs Assessment: post-procedure vital signs reviewed and stable Respiratory status: spontaneous breathing, nonlabored ventilation, respiratory function stable and patient connected to nasal cannula oxygen Cardiovascular status: blood pressure returned to baseline and stable Postop Assessment: no headache, no backache and no apparent nausea or vomiting Anesthetic complications: no   No notable events documented.  Last Vitals:  Vitals:   07/18/22 1120 07/18/22 1130  BP: (!) 109/49 (!) 135/54  Pulse: (!) 55 (!) 52  Resp: 14 16  Temp:    SpO2: 100% 100%    Last Pain:  Vitals:   07/18/22 1130  TempSrc:   PainSc: 0-No pain   Pain Goal:                   [place]

## 2022-07-21 ENCOUNTER — Other Ambulatory Visit: Payer: Self-pay | Admitting: *Deleted

## 2022-07-21 ENCOUNTER — Inpatient Hospital Stay (HOSPITAL_BASED_OUTPATIENT_CLINIC_OR_DEPARTMENT_OTHER): Payer: PPO | Admitting: Hematology and Oncology

## 2022-07-21 ENCOUNTER — Inpatient Hospital Stay: Payer: PPO | Attending: Hematology and Oncology

## 2022-07-21 VITALS — BP 112/61 | HR 52 | Temp 97.6°F | Resp 16 | Wt 186.4 lb

## 2022-07-21 DIAGNOSIS — M160 Bilateral primary osteoarthritis of hip: Secondary | ICD-10-CM | POA: Insufficient documentation

## 2022-07-21 DIAGNOSIS — D729 Disorder of white blood cells, unspecified: Secondary | ICD-10-CM

## 2022-07-21 DIAGNOSIS — D696 Thrombocytopenia, unspecified: Secondary | ICD-10-CM

## 2022-07-21 DIAGNOSIS — Z803 Family history of malignant neoplasm of breast: Secondary | ICD-10-CM | POA: Insufficient documentation

## 2022-07-21 LAB — CBC WITH DIFFERENTIAL (CANCER CENTER ONLY)
Abs Immature Granulocytes: 0 10*3/uL (ref 0.00–0.07)
Basophils Absolute: 0 10*3/uL (ref 0.0–0.1)
Basophils Relative: 1 %
Eosinophils Absolute: 0.2 10*3/uL (ref 0.0–0.5)
Eosinophils Relative: 4 %
HCT: 41 % (ref 39.0–52.0)
Hemoglobin: 13.9 g/dL (ref 13.0–17.0)
Immature Granulocytes: 0 %
Lymphocytes Relative: 21 %
Lymphs Abs: 0.9 10*3/uL (ref 0.7–4.0)
MCH: 31 pg (ref 26.0–34.0)
MCHC: 33.9 g/dL (ref 30.0–36.0)
MCV: 91.3 fL (ref 80.0–100.0)
Monocytes Absolute: 0.4 10*3/uL (ref 0.1–1.0)
Monocytes Relative: 11 %
Neutro Abs: 2.5 10*3/uL (ref 1.7–7.7)
Neutrophils Relative %: 63 %
Platelet Count: 120 10*3/uL — ABNORMAL LOW (ref 150–400)
RBC: 4.49 MIL/uL (ref 4.22–5.81)
RDW: 13.3 % (ref 11.5–15.5)
WBC Count: 4 10*3/uL (ref 4.0–10.5)
nRBC: 0 % (ref 0.0–0.2)

## 2022-07-21 NOTE — Progress Notes (Signed)
[facility] CONSULT NOTE  Patient Care Team: Ma Hillock, DO as PCP - General (Family Medicine) Buford Dresser, MD as PCP - Cardiology (Cardiology) Buford Dresser, MD as Consulting Physician (Cardiology) Benay Pike, MD as Consulting Physician (Hematology and Oncology) Thompson Grayer, MD (Inactive) as Consulting Physician (Cardiology) Danella Sensing, MD as Consulting Physician (Dermatology)  CHIEF COMPLAINTS/PURPOSE OF CONSULTATION:  Cytopenia of unknown significance.  ASSESSMENT & PLAN:   This is a very pleasant 72 year old male patient with past medical history significant for coronary artery disease, congestive heart failure and ischemic cardiomyopathy referred to hematology for evaluation of thrombocytopenia.  I reviewed his 123456, folic acid, hepatitis C, TSH labs which were unremarkable Iron, ANA, hemolysis labs normal.  He is here for follow up today.  No concerns except for some mild mild fatigue.  Physical examination today without any concerns.  No lymphadenopathy or hepatosplenomegaly.  We have reviewed labs which showed stable thrombocytopenia with platelet count 120,000.  At this time I do not believe there is any additional investigation required.  I recommended that he come back for follow-up in 6 months. Thank you for consulting [place] in the care of this patient.  Please do not hesitate to contact us with any additional questions or concerns  HISTORY OF PRESENTING ILLNESS:   [name] Hall 72 y.o. male is here because of Thrombocytopenia  This is a very pleasant 72 year old male patient with past medical history significant for coronary artery disease, congestive heart failure, hypertension, thrombocytopenia referred to hematology for thrombocytopenia.    Interval history  Since last visit, he had a colonoscopy he says. His energy is decent,  he is going to the gym regularly.  He has decent appetite, gaining weight around the  abdomen. He says this is the most heavy he weighed. No change in breathing, he has chronic rhinorrhea. No change in bowel habits. No change in urinary habits No bleeding issues.  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[place])    Myocardial infarction ([place])    2012     Sleep apnea    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   Thrombocytopenia ([place])    Unilateral primary osteoarthritis, left hip 12/18/2017   Unilateral primary osteoarthritis, right hip 12/18/2017   Vegan diet     SURGICAL HISTORY: Past Surgical History:  Procedure Laterality Date   APPENDECTOMY     CARDIAC CATHETERIZATION  2012   X2 with 2 stents    COLONOSCOPY WITH PROPOFOL N/A 07/18/2022   Procedure: COLONOSCOPY WITH PROPOFOL;  Surgeon: Jackquline Denmark, MD;  Location: [facility];  Service: Gastroenterology;  Laterality: N/A;   CORONARY ARTERY BYPASS GRAFT  2007   quadruple    ICD IMPLANT  2012   JOINT  REPLACEMENT  2019   Left Hip   partial discectomy   1995   POLYPECTOMY  07/18/2022   Procedure: POLYPECTOMY;  Surgeon: Jackquline Denmark, MD;  Location: [facility];  Service: Gastroenterology;;   SPINAL FUSION  2003   lumbar    TOTAL HIP ARTHROPLASTY Left 02/15/2018   Procedure: LEFT TOTAL HIP ARTHROPLASTY ANTERIOR APPROACH;  Surgeon: Mcarthur Rossetti, MD;  Location: [facility];  Service: Orthopedics;   Laterality: Left;    SOCIAL HISTORY: Social History   Socioeconomic History   Marital status: Married    Spouse name: Not on file   Number of children: Not on file   Years of education: Not on file   Highest education level: Some college, no degree  Occupational History   Not on file  Tobacco Use   Smoking status: Never    Passive exposure: Never   Smokeless tobacco: Never  Vaping Use   Vaping Use: Never used  Substance and Sexual Activity   Alcohol use: Not Currently    Comment: occ   Drug use: Never   Sexual activity: Yes    Partners: Female    Birth control/protection: None    Comment: married  Other Topics Concern   Not on file  Social History Narrative   Marital status/children/pets: Married.   Education/employment: Printmaker.  Retired Scientist, research (physical sciences).   Safety:      -Wears a bicycle helmet riding a bike: Yes     -smoke alarm in the home:Yes     - wears seatbelt: Yes         Social Determinants of Health   Financial Resource Strain: Low Risk  (06/24/2022)   Overall Financial Resource Strain (CARDIA)    Difficulty of Paying Living Expenses: Not very hard  Food Insecurity: No Food Insecurity (06/24/2022)   Hunger Vital Sign    Worried About Running Out of Food in the Last Year: Never true    Ran Out of Food in the Last Year: Never true  Transportation Needs: No Transportation Needs (06/24/2022)   PRAPARE - Hydrologist (Medical): No    Lack of Transportation (Non-Medical): No  Physical Activity: Insufficiently Active (06/24/2022)   Exercise Vital Sign    Days of Exercise per Week: 2 days    Minutes of Exercise per Session: 30 min  Stress: No Stress Concern Present (06/24/2022)   [place]    Feeling of Stress : Only a little  Social Connections: Moderately Isolated (06/24/2022)   Social Connection and Isolation Panel [NHANES]    Frequency of Communication with  Friends and Family: Once a week    Frequency of Social Gatherings with Friends and Family: Once a week    Attends Religious Services: Never    Marine scientist or Organizations: No    Attends Music therapist: More than 4 times per year    Marital Status: Married  Human resources officer Violence: Not At Risk (11/17/2021)   Humiliation, Afraid, Rape, and Kick questionnaire    Fear of Current or Ex-Partner: No    Emotionally Abused: No    Physically Abused: No    Sexually Abused: No    FAMILY HISTORY: Family History  Problem Relation Age of Onset   Breast cancer Mother    Depression Mother    Alcohol abuse Mother    Hypertension Father    Prostate cancer Father    Heart disease Father  Thyroid disease Sister     ALLERGIES:  is allergic to crestor [rosuvastatin], lipitor [atorvastatin], morphine and related, other, and statins.  MEDICATIONS:  Current Outpatient Medications  Medication Sig Dispense Refill   aspirin EC 81 MG tablet Take 81 mg by mouth daily.     Cholecalciferol 25 MCG (1000 UT) tablet Take 1,000-2,000 Units by mouth every other day.     Cyanocobalamin (VITAMIN B-12 PO) Take 2,500 mcg by mouth every other day.     lisinopril (ZESTRIL) 5 MG tablet TAKE ONE TABLET BY MOUTH DAILY 90 tablet 1   metoprolol succinate (TOPROL XL) 25 MG 24 hr tablet Take 1 tablet (25 mg total) by mouth in the morning and at bedtime. 180 tablet 3   Multiple Vitamin (MULTI-VITAMINS) TABS Take 1 tablet by mouth daily.      mupirocin ointment (BACTROBAN) 2 % SMARTSIG:1 Sparingly Topical Daily     NATTOKINASE PO Take by mouth.     OVER THE COUNTER MEDICATION Take 1 tablet by mouth daily. NMN     REPATHA SURECLICK [id number] MG/ML SOAJ INJECT 140MG  UNDER THE SKIN EVERY 14 DAYS 2 mL 11   tadalafil (CIALIS) 5 MG tablet Take 1 tablet (5 mg total) by mouth daily. 90 tablet 3   Vitamin D-Vitamin K (VITAMIN K2-VITAMIN D3 PO) Take 1 tablet by mouth every other day.     No current  facility-administered medications for this visit.     PHYSICAL EXAMINATION:  ECOG PERFORMANCE STATUS: 0 - Asymptomatic  Vitals:   07/21/22 1119  BP: 112/61  Pulse: (!) 52  Resp: 16  Temp: 97.6 F (36.4 C)  SpO2: 97%   Filed Weights   07/21/22 1119  Weight: 186 lb 6.4 oz (84.6 kg)   Physical Exam Constitutional:      Appearance: Normal appearance.  HENT:     Head: Normocephalic and atraumatic.  Cardiovascular:     Rate and Rhythm: Normal rate and regular rhythm.     Pulses: Normal pulses.     Heart sounds: Normal heart sounds.  Pulmonary:     Effort: Pulmonary effort is normal.     Breath sounds: Normal breath sounds.  Abdominal:     General: Abdomen is flat.     Palpations: Abdomen is soft.  Musculoskeletal:        General: Normal range of motion.  Skin:    General: Skin is warm and dry.  Neurological:     General: No focal deficit present.     Mental Status: He is alert.  Psychiatric:        Mood and Affect: Mood normal.      LABORATORY DATA:  I have reviewed the data as listed Lab Results  Component Value Date   WBC 4.0 07/21/2022   HGB 13.9 07/21/2022   HCT 41.0 07/21/2022   MCV 91.3 07/21/2022   PLT 120 (L) 07/21/2022     Chemistry      Component Value Date/Time   NA 135 01/11/2022 1001   NA 139 05/06/2020 0000   K 5.2 (H) 01/11/2022 1001   CL 103 01/11/2022 1001   CO2 25 01/11/2022 1001   BUN 19 01/11/2022 1001   BUN 11 05/06/2020 0000   CREATININE 1.01 01/11/2022 1001   CREATININE 0.95 07/02/2020 1232   GLU 78 05/06/2020 0000      Component Value Date/Time   CALCIUM 9.1 01/11/2022 1001   ALKPHOS 101 01/11/2022 1001   AST 16 01/11/2022 1001   AST 25 07/02/2020  1232   ALT 9 01/11/2022 1001   ALT 11 07/02/2020 1232   BILITOT 0.5 01/11/2022 1001   BILITOT 0.6 07/02/2020 1232     TSH of 1.8 B12 of 573 Folate of 18 WBC of 3.7 Hep C ab negative. Multiple labs reviewed CBC ranging from 3700 to 4100, platelets mildly low in 120  K 2 yr ago, mild thrombocytopenia, but WBC Count was normal 9 yrs ago, again thrombocytopenia, plt count in 140K, no leukopenia Iron panel and ferritin normal. No evidence of hemolysis  Labs from today with stable platelet count of 120000, no concerns.  RADIOGRAPHIC STUDIES: I have personally reviewed the radiological images as listed and agreed with the findings in the report. No results found.  All questions were answered. The patient knows to call the clinic with any problems, questions or concerns. I spent 20 minutes in the care of this patient including H and P, review of records, counseling and coordination of care.     Benay Pike, MD 07/21/2022 4:38 PM

## 2022-07-23 ENCOUNTER — Encounter: Payer: Self-pay | Admitting: Gastroenterology

## 2022-09-14 ENCOUNTER — Ambulatory Visit (INDEPENDENT_AMBULATORY_CARE_PROVIDER_SITE_OTHER): Payer: PPO | Admitting: Family Medicine

## 2022-09-14 ENCOUNTER — Encounter: Payer: Self-pay | Admitting: Family Medicine

## 2022-09-14 VITALS — BP 108/65 | HR 65 | Temp 97.6°F | Wt 187.2 lb

## 2022-09-14 DIAGNOSIS — L253 Unspecified contact dermatitis due to other chemical products: Secondary | ICD-10-CM

## 2022-09-14 MED ORDER — FLUOCINONIDE 0.05 % EX OINT: 1.0000 | TOPICAL_OINTMENT | Freq: Two times a day (BID) | CUTANEOUS | 1 refills | Status: DC

## 2022-09-14 NOTE — Progress Notes (Signed)
[name] Hall , 1950-12-01, 72 y.o., male MRN: [id number] Patient Care Team    Relationship Specialty Notifications Start End  [name] Leatherwood, DO PCP - General Family Medicine  07/20/21   Jodelle Red, MD PCP - Cardiology Cardiology  06/28/22   Jodelle Red, MD Consulting Physician Cardiology  07/20/21   Rachel Moulds, MD Consulting Physician Hematology and Oncology  07/20/21   Hillis Range, MD (Inactive) Consulting Physician Cardiology  07/20/21   Arminda Resides, MD Consulting Physician Dermatology  07/20/21     Chief Complaint  Patient presents with   Rash    Both arms; 2 weeks; been treating with OTC antifungal for he last week     Subjective: [name] Hall is a 72 y.o. Pt presents for an OV with complaints of rash affecting bilateral arms of 2 weeks duration.  Associated symptoms include itchiness. Patient denies history of eczema or psoriasis.  He has not had any exposure to plants. Pt has tried OTC antifungal cream to ease their symptoms.      01/11/2022    9:38 AM 11/17/2021    3:04 PM 07/20/2021    1:32 PM  Depression screen PHQ 2/9  Decreased Interest 1 0 0  Down, Depressed, Hopeless 0 0 0  PHQ - 2 Score 1 0 0    Allergies  Allergen Reactions   Crestor [Rosuvastatin]     myalgias   Lipitor [Atorvastatin]     myalgias   Morphine And Codeine     Nausea   Other Other (See Comments)    VEGAN ( no dairy , no meat, ,no eggs);  Fish 4-5 times a year   Statins     Muscle pain   Social History   Social History Narrative   Marital status/children/pets: Married.   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)    Myocardial infarction (HCC)    2012     Sleep apnea    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   Thrombocytopenia (HCC)    Unilateral primary osteoarthritis, left hip 12/18/2017   Unilateral primary osteoarthritis, right hip 12/18/2017   Vegan diet    Past Surgical History:  Procedure Laterality Date   APPENDECTOMY     CARDIAC CATHETERIZATION  2012   X2 with 2 stents    COLONOSCOPY WITH PROPOFOL N/A 07/18/2022   Procedure: COLONOSCOPY WITH PROPOFOL;  Surgeon: Lynann Bologna, MD;  Location: [facility];  Service: Gastroenterology;  Laterality: N/A;   CORONARY ARTERY BYPASS GRAFT  2007   quadruple    ICD IMPLANT  2012   JOINT REPLACEMENT  2019   Left Hip   partial discectomy  1995   POLYPECTOMY  07/18/2022   Procedure: POLYPECTOMY;  Surgeon: Lynann Bologna, MD;  Location: [facility];  Service: Gastroenterology;;   SPINAL FUSION  2003   lumbar    TOTAL HIP ARTHROPLASTY Left 02/15/2018   Procedure: LEFT TOTAL HIP ARTHROPLASTY ANTERIOR APPROACH;  Surgeon: Kathryne Hitch, MD;  Location: [facility];  Service: Orthopedics;  Laterality: Left;   Family History  Problem Relation Age of Onset   Breast cancer Mother    Depression Mother    Alcohol abuse Mother    Hypertension Father    Prostate cancer Father    Heart disease Father    Thyroid disease Sister    Allergies as of 09/14/2022       Reactions   Crestor [rosuvastatin]    myalgias   Lipitor [atorvastatin]    myalgias   Morphine And Codeine    Nausea   Other Other (See Comments)   VEGAN ( no dairy , no meat, ,no eggs);  Fish 4-5 times a year   Statins    Muscle pain        Medication List         Accurate as of Sep 14, 2022  9:09 AM. If you have any questions, ask your nurse or doctor.          aspirin EC 81 MG tablet Take 81 mg by mouth daily.   Cholecalciferol 25 MCG (1000 UT) tablet Take 1,000-2,000 Units by mouth every other day.   lisinopril 5 MG tablet Commonly known as: ZESTRIL TAKE ONE TABLET BY MOUTH DAILY   metoprolol succinate 25 MG 24 hr tablet Commonly known as: Toprol XL Take 1 tablet (25 mg total) by mouth in the morning and at bedtime.   Multi-Vitamins Tabs Take 1 tablet by mouth daily.   mupirocin ointment 2 % Commonly known as: BACTROBAN SMARTSIG:1 Sparingly Topical Daily   NATTOKINASE PO Take by mouth.   OVER THE COUNTER MEDICATION Take 1 tablet by mouth daily. NMN   Repatha SureClick 140 MG/ML Soaj Generic drug: Evolocumab INJECT 140MG  UNDER THE SKIN EVERY 14 DAYS   tadalafil 5 MG tablet Commonly known as: CIALIS Take 1 tablet (5 mg total) by mouth daily.   VITAMIN B-12 PO Take 2,500 mcg by mouth every other day.   VITAMIN K2-VITAMIN D3 PO Take 1 tablet by mouth every other day.        All past medical history, surgical history, allergies, family history, immunizations andmedications were updated in the EMR today and reviewed under the history and medication portions of their EMR.     ROS Negative, with the exception of above mentioned in HPI   Objective:  BP 108/65   Pulse 65   Temp 97.6 F (36.4 C)   Wt 187 lb 3.2 oz (84.9 kg)   SpO2 96%   BMI 26.86 kg/m  Body mass index is 26.86 kg/m. Physical Exam Vitals and nursing note reviewed.  Constitutional:      General: He is not in acute distress.    Appearance: Normal appearance. He is not ill-appearing, toxic-appearing or diaphoretic.  HENT:     Head: Normocephalic and atraumatic.  Eyes:     General: No scleral icterus.       Right eye: No discharge.        Left eye: No discharge.     Extraocular Movements: Extraocular movements intact.     Pupils: Pupils are  equal, round, and reactive to light.  Skin:  General: Skin is warm and dry.     Coloration: Skin is not jaundiced or pale.     Findings: Rash present.     Comments: Bilateral arms with red macular papular rash distributed below sleeve line to wrist bilaterally.  Neurological:     Mental Status: He is alert and oriented to person, place, and time. Mental status is at baseline.  Psychiatric:        Mood and Affect: Mood normal.        Behavior: Behavior normal.        Thought Content: Thought content normal.        Judgment: Judgment normal.     No results found. No results found. No results found for this or any previous visit (from the past 24 hour(s)).  Assessment/Plan: YAHSIR BALLERINI is a 72 y.o. male present for OV for  Chemical dermatitis Distribution of rash is below sleeves to wrist where he applies a spray on sunscreen that he does not use on any other areas of his body.  Suspect this is the cause of the dermatitis and have recommended he discontinue use of this particular product. Patient declined oral or injectable steroid today. Lidex ointment prescribed-twice daily 7 to 14 days until rash is resolved. Can use OTC Allegra to help with itching and is reported runny nose/allergies.  Reviewed expectations re: course of current medical issues. Discussed self-management of symptoms. Outlined signs and symptoms indicating need for more acute intervention. Patient verbalized understanding and all questions were answered. Patient received an After-Visit Summary.    No orders of the defined types were placed in this encounter.  No orders of the defined types were placed in this encounter.  Referral Orders  No referral(s) requested today     Note is dictated utilizing voice recognition software. Although note has been proof read prior to signing, occasional typographical errors still can be missed. If any questions arise, please do not hesitate to call for  verification.   electronically signed by:  Felix Pacini, DO   Primary Care - OR

## 2022-10-04 ENCOUNTER — Other Ambulatory Visit: Payer: Self-pay | Admitting: Cardiology

## 2022-10-04 NOTE — Telephone Encounter (Signed)
Rx request sent to pharmacy.  

## 2022-10-05 ENCOUNTER — Other Ambulatory Visit: Payer: Self-pay | Admitting: Cardiology

## 2022-10-26 ENCOUNTER — Ambulatory Visit (INDEPENDENT_AMBULATORY_CARE_PROVIDER_SITE_OTHER): Payer: PPO

## 2022-10-26 DIAGNOSIS — I255 Ischemic cardiomyopathy: Secondary | ICD-10-CM | POA: Diagnosis not present

## 2022-10-26 LAB — CUP PACEART REMOTE DEVICE CHECK
Battery Remaining Longevity: 30 mo
Battery Remaining Percentage: 32 %
Brady Statistic RV Percent Paced: 0 %
Date Time Interrogation Session: 20240626062800
HighPow Impedance: 119 Ohm
Implantable Lead Connection Status: 753985
Implantable Lead Implant Date: 20120913
Implantable Lead Location: 753860
Implantable Lead Model: 293
Implantable Lead Serial Number: 107958
Implantable Pulse Generator Implant Date: 20120913
Lead Channel Impedance Value: 463 Ohm
Lead Channel Pacing Threshold Amplitude: 0.8 V
Lead Channel Pacing Threshold Pulse Width: 0.5 ms
Lead Channel Setting Pacing Amplitude: 2 V
Lead Channel Setting Pacing Pulse Width: 0.5 ms
Lead Channel Setting Sensing Sensitivity: 0.6 mV
Pulse Gen Serial Number: 105130
Zone Setting Status: 755011

## 2022-11-16 NOTE — Progress Notes (Signed)
 Remote ICD transmission.   

## 2022-12-29 ENCOUNTER — Other Ambulatory Visit: Payer: Self-pay | Admitting: Cardiology

## 2022-12-29 NOTE — Telephone Encounter (Signed)
Rx request sent to pharmacy.  

## 2023-01-04 ENCOUNTER — Encounter (HOSPITAL_BASED_OUTPATIENT_CLINIC_OR_DEPARTMENT_OTHER): Payer: Self-pay | Admitting: Cardiology

## 2023-01-04 ENCOUNTER — Ambulatory Visit (HOSPITAL_BASED_OUTPATIENT_CLINIC_OR_DEPARTMENT_OTHER): Payer: PPO | Admitting: Cardiology

## 2023-01-04 VITALS — BP 102/60 | HR 47 | Ht 70.0 in | Wt 180.6 lb

## 2023-01-04 DIAGNOSIS — I2510 Atherosclerotic heart disease of native coronary artery without angina pectoris: Secondary | ICD-10-CM | POA: Diagnosis not present

## 2023-01-04 DIAGNOSIS — I236 Thrombosis of atrium, auricular appendage, and ventricle as current complications following acute myocardial infarction: Secondary | ICD-10-CM | POA: Diagnosis not present

## 2023-01-04 DIAGNOSIS — E7800 Pure hypercholesterolemia, unspecified: Secondary | ICD-10-CM

## 2023-01-04 DIAGNOSIS — Z789 Other specified health status: Secondary | ICD-10-CM

## 2023-01-04 DIAGNOSIS — Z951 Presence of aortocoronary bypass graft: Secondary | ICD-10-CM

## 2023-01-04 DIAGNOSIS — I255 Ischemic cardiomyopathy: Secondary | ICD-10-CM | POA: Diagnosis not present

## 2023-01-04 NOTE — Patient Instructions (Signed)
Medication Instructions:  Your physician recommends that you continue on your current medications as directed. Please refer to the Current Medication list given to you today.  *If you need a refill on your cardiac medications before your next appointment, please call your pharmacy*   Lab Work: Labs on the next week or so- FASTING   Follow-Up: At [facility], you and your health needs are our priority.  As part of our continuing mission to provide you with exceptional heart care, we have created designated Provider Care Teams.  These Care Teams include your primary Cardiologist (physician) and Advanced Practice Providers (APPs -  Physician Assistants and Nurse Practitioners) who all work together to provide you with the care you need, when you need it.  We recommend signing up for the patient portal called "MyChart".  Sign up information is provided on this After Visit Summary.  MyChart is used to connect with patients for Virtual Visits (Telemedicine).  Patients are able to view lab/test results, encounter notes, upcoming appointments, etc.  Non-urgent messages can be sent to your provider as well.   To learn more about what you can do with MyChart, go to [email or web address].    Your next appointment:   6 months with Dr. Cristal Deer

## 2023-01-04 NOTE — Progress Notes (Signed)
Cardiology Office Note:  .    Date:  01/04/2023  ID:  [name] Hall, DOB Sep 15, 1950, MRN [id number] PCP: [name] Leatherwood, DO  [facility] Providers Cardiologist:  [name] Red, MD     History of Present Illness: .    [name] Hall is Hall 72 y.o. male with Hall hx of CAD, CHF, fibromyalgia, HLD with statin intolerance, sleep apnea, Myocardial Infarction (2012) s/p CABG x4, ischemic cardiomyopathy who is seen for follow-up. He was initially seen 12/29/20 as Hall new consult at the request of Kuneff, Renee A, DO for the evaluation and management of Ischemic cardiomyopathy.   CV history/risk factors: Exercise level: Walks Hall golf course/mows the lawn without exertional symptoms Current diet: Vegan diet for >10 years. 2019: LV thrombus on echo, placed on apixaban. Did not have immediate repeat study. We repeated in 09/2020, no thrombus, apixaban stopped. Repeat echo without thrombus.   In 06/2021, he complained of low energy and back pain preventing him from exercising consistently. Since discontinuing Eliquis, he had been taking Hall full 325 mg aspirin and using an inversion table twice Hall week because he was nervous about thrombosis. The inversion also alleviated his back pain. Given some symptoms and bradycardia, we decreased metoprolol succinate to 25 mg BID.   At his visit 06/2022, his blood pressure was 96/52 in the office, normally 100-110 systolic at home. He denied any dizziness or lightheadedness. Occasionally was aware of feeling something in his chest, possibly Hall faster or slower heart rate. He continued to struggle with low energy levels but had returned to the gym to work on increasing his exercise. Continued aspirin, recommended 81 mg dose. He had added nattokinase on his own, discussed unknowns about this, risk for bleeding.  Today, he states he is feeling well with no new cardiovascular concerns. However, the other day he was outside and raking weeds for about 30 min to an  hour. He suddenly felt wiped out and weak, went inside to cool himself off. He was surprised that he had been unable to keep going for Hall longer time. After about 20 minutes he felt recovered. Overall he describes this as being more limited by fatigue and feelings of generalized weakness.   In the office his blood pressure is stable at 102/60. Sometimes has lows in the 90s/50s at home but he is still able to function. On rare occasions, he may be lightheaded only if he stands up from bending over for too long.   He continues to have chronic issues with insomnia. Typically sleeps for about 3 hours before waking up again.  He denies any palpitations, chest pain, shortness of breath, peripheral edema, headaches, syncope, orthopnea, or PND.  ROS:  Please see the history of present illness. ROS otherwise negative except as noted.  (+) Exertional fatigue, weakness (+) Rare positional lightheadedness (+) Chronic insomnia  Studies Reviewed: Marland Kitchen         Physical Exam:    VS:  BP 102/60   Pulse (!) 47   Ht 5\' 10"  (1.778 m)   Wt 180 lb 9.6 oz (81.9 kg)   SpO2 99%   BMI 25.91 kg/m    Wt Readings from Last 3 Encounters:  01/04/23 180 lb 9.6 oz (81.9 kg)  09/14/22 187 lb 3.2 oz (84.9 kg)  07/21/22 186 lb 6.4 oz (84.6 kg)    GEN: Well nourished, well developed in no acute distress HEENT: Normal, moist mucous membranes NECK: No JVD CARDIAC: regular rhythm, normal S1  and S2, no rubs or gallops. No murmur. VASCULAR: Radial and DP pulses 2+ bilaterally. No carotid bruits RESPIRATORY:  Clear to auscultation without rales, wheezing or rhonchi  ABDOMEN: Soft, non-tender, non-distended MUSCULOSKELETAL:  Ambulates independently SKIN: Warm and dry, no edema NEUROLOGIC:  Alert and oriented x 3. No focal neuro deficits noted. PSYCHIATRIC:  Normal affect   ASSESSMENT AND PLAN: .    CAD with prior CABG Hypercholesterolemia, LDL goal <70, with statin intolerance Ischemic cardiomyopathy -continue  aspirin, recommend 81 mg dose. He has added nattokinase on his own, discussed unknown about this, risk for bleeding -has not tolerated multiple statins. Tolerating PCSK9i. Last LDL 38, due for repeat, ordered -NYHA class I -on metoprolol succinate 25 mg BID. Has ICD in place.  -on lisinopril 5 mg daily, borderline low BP at baseline. Did not tolerate spironolactone in the past due to hyperkalemia. No blood pressure room for entresto -had brain fog on farxiga, declines additional trial or trial of Jardiance   History of LV thrombus 2019 -rechecked echo, no thrombus. Stopped anticoagulation, no thrombus on repeat echo   Cardiac risk counseling and prevention recommendations: -recommend heart healthy/Mediterranean diet, with whole grains, fruits, vegetable, fish, lean meats, nuts, and olive oil. Limit salt. -recommend moderate walking, 3-5 times/week for 30-50 minutes each session. Aim for at least 150 minutes.week. Goal should be pace of 3 miles/hours, or walking 1.5 miles in 30 minutes -recommend avoidance of tobacco products. Avoid excess alcohol.   Dispo: Follow-up in 6 months, or sooner as needed.  I,[name] Hall,acting as Hall Neurosurgeon for [name] Parts, MD.,have documented all relevant documentation on the behalf of [name] Red, MD,as directed by  [name] Red, MD while in the presence of [name] Red, MD.  I, [name] Red, MD, have reviewed all documentation for this visit. The documentation on 02/13/23 for the exam, diagnosis, procedures, and orders are all accurate and complete.   Signed, [name] Red, MD

## 2023-01-17 ENCOUNTER — Inpatient Hospital Stay: Payer: PPO | Admitting: Hematology and Oncology

## 2023-01-17 ENCOUNTER — Encounter: Payer: Self-pay | Admitting: Hematology and Oncology

## 2023-01-17 ENCOUNTER — Inpatient Hospital Stay: Payer: PPO | Attending: Hematology and Oncology

## 2023-01-17 ENCOUNTER — Other Ambulatory Visit: Payer: Self-pay

## 2023-01-17 VITALS — BP 107/55 | HR 57 | Temp 97.5°F | Resp 16 | Wt 179.6 lb

## 2023-01-17 DIAGNOSIS — D696 Thrombocytopenia, unspecified: Secondary | ICD-10-CM

## 2023-01-17 DIAGNOSIS — Z8042 Family history of malignant neoplasm of prostate: Secondary | ICD-10-CM | POA: Diagnosis not present

## 2023-01-17 DIAGNOSIS — D72819 Decreased white blood cell count, unspecified: Secondary | ICD-10-CM | POA: Diagnosis not present

## 2023-01-17 DIAGNOSIS — Z803 Family history of malignant neoplasm of breast: Secondary | ICD-10-CM | POA: Insufficient documentation

## 2023-01-17 DIAGNOSIS — I519 Heart disease, unspecified: Secondary | ICD-10-CM | POA: Insufficient documentation

## 2023-01-17 DIAGNOSIS — R634 Abnormal weight loss: Secondary | ICD-10-CM | POA: Diagnosis not present

## 2023-01-17 DIAGNOSIS — R5382 Chronic fatigue, unspecified: Secondary | ICD-10-CM | POA: Diagnosis not present

## 2023-01-17 LAB — CBC WITH DIFFERENTIAL (CANCER CENTER ONLY)
Abs Immature Granulocytes: 0.01 10*3/uL (ref 0.00–0.07)
Basophils Absolute: 0 10*3/uL (ref 0.0–0.1)
Basophils Relative: 0 %
Eosinophils Absolute: 0.1 10*3/uL (ref 0.0–0.5)
Eosinophils Relative: 2 %
HCT: 44.7 % (ref 39.0–52.0)
Hemoglobin: 14.9 g/dL (ref 13.0–17.0)
Immature Granulocytes: 0 %
Lymphocytes Relative: 18 %
Lymphs Abs: 0.9 10*3/uL (ref 0.7–4.0)
MCH: 30.8 pg (ref 26.0–34.0)
MCHC: 33.3 g/dL (ref 30.0–36.0)
MCV: 92.4 fL (ref 80.0–100.0)
Monocytes Absolute: 0.6 10*3/uL (ref 0.1–1.0)
Monocytes Relative: 11 %
Neutro Abs: 3.5 10*3/uL (ref 1.7–7.7)
Neutrophils Relative %: 69 %
Platelet Count: 141 10*3/uL — ABNORMAL LOW (ref 150–400)
RBC: 4.84 MIL/uL (ref 4.22–5.81)
RDW: 13.2 % (ref 11.5–15.5)
WBC Count: 5.1 10*3/uL (ref 4.0–10.5)
nRBC: 0 % (ref 0.0–0.2)

## 2023-01-17 NOTE — Progress Notes (Signed)
[facility] CONSULT NOTE  Patient Care Team: Natalia Leatherwood, DO as PCP - General (Family Medicine) Jodelle Red, MD as PCP - Cardiology (Cardiology) Jodelle Red, MD as Consulting Physician (Cardiology) Rachel Moulds, MD as Consulting Physician (Hematology and Oncology) Hillis Range, MD (Inactive) as Consulting Physician (Cardiology) Arminda Resides, MD as Consulting Physician (Dermatology)  CHIEF COMPLAINTS/PURPOSE OF CONSULTATION:  Cytopenia of unknown significance.  ASSESSMENT & PLAN:   This is a very pleasant 72 year old male patient with past medical history significant for coronary artery disease, congestive heart failure and ischemic cardiomyopathy referred to hematology for evaluation of thrombocytopenia.   Thrombocytopenia Stable. No evidence of leukopenia and anemia. Continue monitoring every 6 months.  Chronic Fatigue Persistent for months. No clear etiology identified. Possible contributing factors include heart disease and hormonal changes related to aging. -Encouraged to engage in regular exercise, such as gym workouts, to improve energy levels.  Unintentional Weight Loss Lost about seven pounds since last visit.  Patient reports gaining abdominal fat, suggesting possible muscle loss. -Would recommend calorie counting and strength training  Heart Disease No changes in symptoms or medication regimen reported. Possible contributor to chronic fatigue. -Continue current medications and follow-up with cardiologist as scheduled.  General Health Maintenance -ok to take magnesium at night to aid with sleep. -Follow-up in six months or sooner if needed.   Thank you for consulting [place] in the care of this patient.  Please do not hesitate to contact us with any additional questions or concerns  HISTORY OF PRESENTING ILLNESS:   [name] Hall 72 y.o. male is here because of Thrombocytopenia  This is a very pleasant 72 year old male  patient with past medical history significant for coronary artery disease, congestive heart failure, hypertension, thrombocytopenia referred to hematology for thrombocytopenia.    Interval history  Discussed the use of AI scribe software for clinical note transcription with the patient, who gave verbal consent to proceed.  History of Present Illness   The patient, with a history of heart disease, presents with ongoing fatigue and weight loss. He reports a lack of energy and strength that has been consistent for several months. Despite eating regularly, he has lost about seven pounds and noticed an increase in abdominal fat, suggesting a loss of muscle mass. He denies changes in breathing, bowel habits, and urination. He has been adhering to his medication regimen and recently saw his cardiologist, with no changes in his cardiac condition or medications.       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   Thrombocytopenia (HCC)    Unilateral  primary osteoarthritis, left hip 12/18/2017   Unilateral primary osteoarthritis, right hip 12/18/2017   Vegan diet     SURGICAL HISTORY: Past Surgical History:  Procedure Laterality Date   APPENDECTOMY     CARDIAC CATHETERIZATION  2012    X2 with 2 stents    COLONOSCOPY WITH PROPOFOL N/A 07/18/2022   Procedure: COLONOSCOPY WITH PROPOFOL;  Surgeon: Lynann Bologna, MD;  Location: [facility];  Service: Gastroenterology;  Laterality: N/A;   CORONARY ARTERY BYPASS GRAFT  2007   quadruple    ICD IMPLANT  2012   JOINT REPLACEMENT  2019   Left Hip   partial discectomy   1995   POLYPECTOMY  07/18/2022   Procedure: POLYPECTOMY;  Surgeon: Lynann Bologna, MD;  Location: [facility];  Service: Gastroenterology;;   SPINAL FUSION  2003   lumbar    TOTAL HIP ARTHROPLASTY Left 02/15/2018   Procedure: LEFT TOTAL HIP ARTHROPLASTY ANTERIOR APPROACH;  Surgeon: Kathryne Hitch, MD;  Location: [facility];  Service: Orthopedics;  Laterality: Left;    SOCIAL HISTORY: Social History   Socioeconomic History   Marital status: Married    Spouse name: Not on file   Number of children: Not on file   Years of education: Not on file   Highest education level: Some college, no degree  Occupational History   Not on file  Tobacco Use   Smoking status: Never    Passive exposure: Never   Smokeless tobacco: Never  Vaping Use   Vaping status: Never Used  Substance and Sexual Activity   Alcohol use: Not Currently    Comment: occ   Drug use: Never   Sexual activity: Yes    Partners: Female    Birth control/protection: None    Comment: married  Other Topics Concern   Not on file  Social History Narrative   Marital status/children/pets: Married.   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   Safety:      -Wears a bicycle helmet riding a bike: Yes     -smoke alarm in the home:Yes     - wears seatbelt: Yes         Social Determinants of Health   Financial Resource Strain: Low Risk  (06/24/2022)   Overall Financial Resource Strain (CARDIA)    Difficulty of Paying Living Expenses: Not very hard  Food Insecurity: No Food Insecurity (06/24/2022)   Hunger Vital Sign    Worried About Running Out of Food in the Last Year:  Never true    Ran Out of Food in the Last Year: Never true  Transportation Needs: No Transportation Needs (06/24/2022)   PRAPARE - Administrator, Civil Service (Medical): No    Lack of Transportation (Non-Medical): No  Physical Activity: Insufficiently Active (06/24/2022)   Exercise Vital Sign    Days of Exercise per Week: 2 days    Minutes of Exercise per Session: 30 min  Stress: No Stress Concern Present (06/24/2022)   [employer] of Occupational Health - Occupational Stress Questionnaire    Feeling of Stress : Only a little  Social Connections: Socially Isolated (06/24/2022)   Social Connection and Isolation Panel [NHANES]    Frequency of Communication with Friends and Family: Once a week    Frequency of Social Gatherings with Friends and Family: Once a week    Attends Religious Services: Never    Database administrator or Organizations: No    Attends Banker Meetings: Not on file  Marital Status: Married  Catering manager Violence: Not At Risk (11/17/2021)   Humiliation, Afraid, Rape, and Kick questionnaire    Fear of Current or Ex-Partner: No    Emotionally Abused: No    Physically Abused: No    Sexually Abused: No    FAMILY HISTORY: Family History  Problem Relation Age of Onset   Breast cancer Mother    Depression Mother    Alcohol abuse Mother    Hypertension Father    Prostate cancer Father    Heart disease Father    Thyroid disease Sister     ALLERGIES:  is allergic to crestor [rosuvastatin], lipitor [atorvastatin], morphine and codeine, other, and statins.  MEDICATIONS:  Current Outpatient Medications  Medication Sig Dispense Refill   aspirin EC 81 MG tablet Take 81 mg by mouth daily.     Cholecalciferol 25 MCG (1000 UT) tablet Take 1,000-2,000 Units by mouth every other day.     Cyanocobalamin (VITAMIN B-12 PO) Take 2,500 mcg by mouth every other day.     lisinopril (ZESTRIL) 5 MG tablet TAKE 1 TABLET BY MOUTH DAILY 90 tablet 1    metoprolol succinate (TOPROL XL) 25 MG 24 hr tablet Take 1 tablet (25 mg total) by mouth in the morning and at bedtime. 180 tablet 3   Multiple Vitamin (MULTI-VITAMINS) TABS Take 1 tablet by mouth daily.      NATTOKINASE PO Take by mouth.     OVER THE COUNTER MEDICATION Take 1 tablet by mouth daily. NMN     REPATHA SURECLICK 140 MG/ML SOAJ INJECT 140MG  UNDER THE SKIN EVERY 14 DAYS 2 mL 11   tadalafil (CIALIS) 5 MG tablet Take 1 tablet (5 mg total) by mouth daily. 90 tablet 3   Vitamin D-Vitamin K (VITAMIN K2-VITAMIN D3 PO) Take 1 tablet by mouth every other day.     No current facility-administered medications for this visit.     PHYSICAL EXAMINATION:  ECOG PERFORMANCE STATUS: 0 - Asymptomatic  Vitals:   01/17/23 1311  BP: (!) 107/55  Pulse: (!) 57  Resp: 16  Temp: (!) 97.5 F (36.4 C)  SpO2: 100%    Filed Weights   01/17/23 1311  Weight: 179 lb 9.6 oz (81.5 kg)    Physical Exam Constitutional:      Appearance: Normal appearance.  HENT:     Head: Normocephalic and atraumatic.  Cardiovascular:     Rate and Rhythm: Normal rate and regular rhythm.     Pulses: Normal pulses.     Heart sounds: Normal heart sounds.  Pulmonary:     Effort: Pulmonary effort is normal.     Breath sounds: Normal breath sounds.  Abdominal:     General: Abdomen is flat.     Palpations: Abdomen is soft.  Musculoskeletal:        General: Normal range of motion.  Skin:    General: Skin is warm and dry.  Neurological:     General: No focal deficit present.     Mental Status: He is alert.  Psychiatric:        Mood and Affect: Mood normal.      LABORATORY DATA:  I have reviewed the data as listed Lab Results  Component Value Date   WBC 5.1 01/17/2023   HGB 14.9 01/17/2023   HCT 44.7 01/17/2023   MCV 92.4 01/17/2023   PLT 141 (L) 01/17/2023     Chemistry      Component Value Date/Time   NA 135 01/11/2022 1001  NA 139 05/06/2020 0000   K 5.2 (H) 01/11/2022 1001   CL 103  01/11/2022 1001   CO2 25 01/11/2022 1001   BUN 19 01/11/2022 1001   BUN 11 05/06/2020 0000   CREATININE 1.01 01/11/2022 1001   CREATININE 0.95 07/02/2020 1232   GLU 78 05/06/2020 0000      Component Value Date/Time   CALCIUM 9.1 01/11/2022 1001   ALKPHOS 101 01/11/2022 1001   AST 16 01/11/2022 1001   AST 25 07/02/2020 1232   ALT 9 01/11/2022 1001   ALT 11 07/02/2020 1232   BILITOT 0.5 01/11/2022 1001   BILITOT 0.6 07/02/2020 1232     TSH of 1.8 B12 of 573 Folate of 18 WBC of 3.7 Hep C ab negative. Multiple labs reviewed CBC ranging from 3700 to 4100, platelets mildly low in 120 K 2 yr ago, mild thrombocytopenia, but WBC Count was normal 9 yrs ago, again thrombocytopenia, plt count in 140K, no leukopenia Iron panel and ferritin normal. No evidence of hemolysis  Labs from today with plt count of 141K today  RADIOGRAPHIC STUDIES: I have personally reviewed the radiological images as listed and agreed with the findings in the report. No results found.  All questions were answered. The patient knows to call the clinic with any problems, questions or concerns. I spent 20 minutes in the care of this patient including H and P, review of records, counseling and coordination of care.     Rachel Moulds, MD 01/17/2023 1:24 PM

## 2023-01-25 ENCOUNTER — Ambulatory Visit: Payer: PPO

## 2023-01-25 DIAGNOSIS — I255 Ischemic cardiomyopathy: Secondary | ICD-10-CM

## 2023-01-25 LAB — CUP PACEART REMOTE DEVICE CHECK
Battery Remaining Longevity: 30 mo
Battery Remaining Percentage: 30 %
Brady Statistic RV Percent Paced: 0 %
Date Time Interrogation Session: 20240924115100
HighPow Impedance: 116 Ohm
Implantable Lead Connection Status: 753985
Implantable Lead Implant Date: 20120913
Implantable Lead Location: 753860
Implantable Lead Model: 293
Implantable Lead Serial Number: 107958
Implantable Pulse Generator Implant Date: 20120913
Lead Channel Impedance Value: 468 Ohm
Lead Channel Pacing Threshold Amplitude: 0.8 V
Lead Channel Pacing Threshold Pulse Width: 0.5 ms
Lead Channel Setting Pacing Amplitude: 2 V
Lead Channel Setting Pacing Pulse Width: 0.5 ms
Lead Channel Setting Sensing Sensitivity: 0.6 mV
Pulse Gen Serial Number: 105130
Zone Setting Status: 755011

## 2023-01-26 DIAGNOSIS — E7800 Pure hypercholesterolemia, unspecified: Secondary | ICD-10-CM | POA: Diagnosis not present

## 2023-01-27 LAB — LIPID PANEL
Chol/HDL Ratio: 2.6 {ratio} (ref 0.0–5.0)
Cholesterol, Total: 110 mg/dL (ref 100–199)
HDL: 42 mg/dL (ref >39)
LDL Chol Calc (NIH): 45 mg/dL (ref 0–99)
Triglycerides: 131 mg/dL (ref 0–149)
VLDL Cholesterol Cal: 23 mg/dL (ref 5–40)

## 2023-02-10 NOTE — Progress Notes (Signed)
Remote ICD transmission.   

## 2023-04-01 ENCOUNTER — Encounter (HOSPITAL_BASED_OUTPATIENT_CLINIC_OR_DEPARTMENT_OTHER): Payer: Self-pay

## 2023-04-03 ENCOUNTER — Other Ambulatory Visit: Payer: Self-pay | Admitting: Cardiology

## 2023-04-04 ENCOUNTER — Ambulatory Visit: Payer: PPO | Attending: Internal Medicine

## 2023-04-04 DIAGNOSIS — I255 Ischemic cardiomyopathy: Secondary | ICD-10-CM

## 2023-04-04 NOTE — Patient Instructions (Signed)
If you have any questions or concerns call our office at [phone].

## 2023-04-05 NOTE — Progress Notes (Signed)
Patient brought in today because his device has been beeping off an on for weeks per his report.  Recent transmission reveals that his shock impedance has been trending above programmed alert.    Interrogation of device in clinic reveals patient's shock impedance trend is stable and in range per ongoing baseline.  RV lead impedance is WNL and other sensing and pacing thresholds are also stable per trends and well WNL.  No episodes and graphs normal for patient and level of acivity.     Shock impedence range for patient normally runs between 100 - 120's, with patient alerts occurring once hits 125.  Reviewed with Dr. Elberta Fortis and Joey ([facility] rep).  Patient is 1 year away from gen change. Device is functioning normally, no concern for shock lead failure.    PROGRAMMING CHANGE: 1.  Increase shock impedance alert to clinic from 125 to 150 2.  Paitient has requested that we leave the beep alert at 150 on for him to hear as well rather than programming off.  Patient also a prior [facility] patient and needs to re-establish with EP provider.  He has been assigned to Dr. Elberta Fortis.  Appt made for in clinic follow up with EP for next available in January.  He has device clinic number if any concerns in meantime.

## 2023-04-07 LAB — CUP PACEART INCLINIC DEVICE CHECK
Date Time Interrogation Session: 20241203000000
HighPow Impedance: 122 Ohm
Implantable Lead Connection Status: 753985
Implantable Lead Implant Date: 20120913
Implantable Lead Location: 753860
Implantable Lead Model: 293
Implantable Lead Serial Number: 107958
Implantable Pulse Generator Implant Date: 20120913
Lead Channel Impedance Value: 463 Ohm
Lead Channel Pacing Threshold Amplitude: 0.7 V
Lead Channel Pacing Threshold Pulse Width: 0.5 ms
Lead Channel Sensing Intrinsic Amplitude: 13.6 mV
Lead Channel Setting Pacing Amplitude: 2 V
Lead Channel Setting Pacing Pulse Width: 0.5 ms
Lead Channel Setting Sensing Sensitivity: 0.6 mV
Pulse Gen Serial Number: 105130
Zone Setting Status: 755011

## 2023-04-27 ENCOUNTER — Ambulatory Visit (INDEPENDENT_AMBULATORY_CARE_PROVIDER_SITE_OTHER): Payer: PPO

## 2023-04-27 DIAGNOSIS — I255 Ischemic cardiomyopathy: Secondary | ICD-10-CM | POA: Diagnosis not present

## 2023-04-27 LAB — CUP PACEART REMOTE DEVICE CHECK
Battery Remaining Longevity: 24 mo
Battery Remaining Percentage: 25 %
Brady Statistic RV Percent Paced: 1 %
Date Time Interrogation Session: 20241226035100
HighPow Impedance: 113 Ohm
Implantable Lead Connection Status: 753985
Implantable Lead Implant Date: 20120913
Implantable Lead Location: 753860
Implantable Lead Model: 293
Implantable Lead Serial Number: 107958
Implantable Pulse Generator Implant Date: 20120913
Lead Channel Impedance Value: 432 Ohm
Lead Channel Pacing Threshold Amplitude: 0.7 V
Lead Channel Pacing Threshold Pulse Width: 0.5 ms
Lead Channel Setting Pacing Amplitude: 2 V
Lead Channel Setting Pacing Pulse Width: 0.5 ms
Lead Channel Setting Sensing Sensitivity: 0.6 mV
Pulse Gen Serial Number: 105130
Zone Setting Status: 755011

## 2023-05-15 ENCOUNTER — Encounter: Payer: Self-pay | Admitting: Family Medicine

## 2023-05-16 ENCOUNTER — Ambulatory Visit (INDEPENDENT_AMBULATORY_CARE_PROVIDER_SITE_OTHER): Payer: PPO | Admitting: Family Medicine

## 2023-05-16 ENCOUNTER — Encounter: Payer: Self-pay | Admitting: Family Medicine

## 2023-05-16 ENCOUNTER — Encounter: Payer: PPO | Admitting: Cardiology

## 2023-05-16 VITALS — BP 98/62 | HR 93 | Temp 97.8°F | Wt 187.0 lb

## 2023-05-16 DIAGNOSIS — R351 Nocturia: Secondary | ICD-10-CM | POA: Diagnosis not present

## 2023-05-16 DIAGNOSIS — N401 Enlarged prostate with lower urinary tract symptoms: Secondary | ICD-10-CM | POA: Diagnosis not present

## 2023-05-16 LAB — BASIC METABOLIC PANEL
BUN: 13 mg/dL (ref 6–23)
CO2: 28 meq/L (ref 19–32)
Calcium: 9.1 mg/dL (ref 8.4–10.5)
Chloride: 105 meq/L (ref 96–112)
Creatinine, Ser: 0.98 mg/dL (ref 0.40–1.50)
GFR: 77.24 mL/min (ref >60.00)
Glucose, Bld: 83 mg/dL (ref 70–99)
Potassium: 5 meq/L (ref 3.5–5.1)
Sodium: 139 meq/L (ref 135–145)

## 2023-05-16 MED ORDER — TADALAFIL 5 MG PO TABS: 5.0000 mg | ORAL_TABLET | Freq: Every day | ORAL | 3 refills | Status: DC

## 2023-05-16 NOTE — Progress Notes (Signed)
 Patient ID: [name] Hall, male  DOB: 04/30/1951, 73 y.o.   MRN: [id number] Patient Care Team    Relationship Specialty Notifications Start End  Catherine Charlies LABOR, DO PCP - General Family Medicine  07/20/21   Lonni Slain, MD PCP - Cardiology Cardiology  06/28/22   Lonni Slain, MD Consulting Physician Cardiology  07/20/21   Loretha Ash, MD Consulting Physician Hematology and Oncology  07/20/21   Kelsie Agent, MD (Inactive) Consulting Physician Cardiology  07/20/21   Joshua Sieving, MD Consulting Physician Dermatology  07/20/21     Chief Complaint  Patient presents with   Medical Management of Chronic Issues    cmc    Subjective: [name] Hall is a 73 y.o. male present for follow-up on BPH. All past medical history, surgical history, allergies, family history, immunizations, medications and social history were updated in the electronic medical record today. All recent labs, ED visits and hospitalizations within the last year were reviewed.  BPH: Reports symptoms have been well controlled with the use of  Cialis  5 mg daily.  He receives printed prescriptions for his Cialis  so that he can shop for cheapest price.  He states this medicine works very well for him.  He also feels it helps with his memory.    He is currently diagnosed as NYHA class I through his cardiology team.  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  Followed closely by cardiology. Denies any negative side effects to medication, chest pain, dizziness or shortness of breath.      01/11/2022    9:38 AM 11/17/2021    3:04 PM 07/20/2021    1:32 PM  Depression screen PHQ 2/9  Decreased Interest 1 0 0  Down, Depressed, Hopeless 0 0 0  PHQ - 2 Score 1 0 0       No data to display                  06/24/2022    3:59 PM 01/11/2022    9:35 AM 11/17/2021    3:07 PM 07/20/2021    1:32 PM  Fall Risk   Falls in the past year? 0 0 0 0  Number falls in past yr:  0  0 0  Injury with Fall?  0 0 0  Risk for fall due to :    No Fall Risks  Follow up  Falls evaluation completed Falls prevention discussed Falls evaluation completed    Immunization History  Administered Date(s) Administered   Fluad  Quad(high Dose 65+) 03/28/2022   Influenza Split 01/06/2012, 05/02/2017, 03/10/2020   Influenza, High Dose Seasonal PF 05/13/2017, 02/16/2018   Influenza,inj,Quad PF,6+ Mos 04/09/2013, 02/26/2015   Influenza-Unspecified 01/18/2012, 05/06/2017, 02/04/2019, 02/18/2021   PFIZER(Purple Top)SARS-COV-2 Vaccination 05/21/2019, 06/11/2019, 02/08/2020   Pneumococcal Conjugate-13 06/08/2017   Pneumococcal Polysaccharide-23 02/16/2018   Tdap 04/17/2007, 02/14/2022    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)  Myocardial infarction [facility])    2012     Sleep apnea    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   Thrombocytopenia (HCC)    Unilateral primary osteoarthritis, left hip 12/18/2017   Unilateral primary osteoarthritis, right hip 12/18/2017   Vegan diet    Allergies  Allergen Reactions   Crestor [Rosuvastatin]     myalgias   Lipitor [Atorvastatin]     myalgias   Morphine And Codeine     Nausea   Other Other (See Comments)    VEGAN ( no dairy , no meat, ,no eggs);  Fish 4-5 times a year   Statins     Muscle pain   Past Surgical History:  Procedure Laterality Date   APPENDECTOMY     CARDIAC CATHETERIZATION  2012   X2 with 2 stents    COLONOSCOPY WITH PROPOFOL  N/A 07/18/2022   Procedure:  COLONOSCOPY WITH PROPOFOL ;  Surgeon: Charlanne Groom, MD;  Location: [facility];  Service: Gastroenterology;  Laterality: N/A;   CORONARY ARTERY BYPASS GRAFT  2007   quadruple    ICD IMPLANT  2012   JOINT REPLACEMENT  2019   Left Hip   partial discectomy   1995   POLYPECTOMY  07/18/2022   Procedure: POLYPECTOMY;  Surgeon: Charlanne Groom, MD;  Location: [facility];  Service: Gastroenterology;;   SPINAL FUSION  2003   lumbar    TOTAL HIP ARTHROPLASTY Left 02/15/2018   Procedure: LEFT TOTAL HIP ARTHROPLASTY ANTERIOR APPROACH;  Surgeon: Vernetta Lonni GRADE, MD;  Location: [facility];  Service: Orthopedics;  Laterality: Left;   Family History  Problem Relation Age of Onset   Breast cancer Mother    Depression Mother    Alcohol abuse Mother    Hypertension Father    Prostate cancer Father    Heart disease Father    Thyroid  disease Sister    Social History   Social History Narrative   Marital status/children/pets: Married.   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   Safety:      -Wears a bicycle helmet riding a bike: Yes     -smoke alarm in the home:Yes     - wears seatbelt: Yes          Allergies as of 05/16/2023       Reactions   Crestor [rosuvastatin]    myalgias   Lipitor [atorvastatin]    myalgias   Morphine And Codeine    Nausea   Other Other (See Comments)   VEGAN ( no dairy , no meat, ,no eggs);  Fish 4-5 times a year   Statins    Muscle pain        Medication List        Accurate as of May 16, 2023 11:53 AM. If you have any questions, ask your nurse or doctor.          aspirin  EC 81 MG tablet Take 81 mg by mouth daily.   Cholecalciferol 25 MCG (1000 UT) tablet Take 1,000-2,000 Units by mouth every other day.   lisinopril  5 MG tablet Commonly known as: ZESTRIL  TAKE 1 TABLET BY MOUTH DAILY   metoprolol  succinate 25 MG 24 hr tablet Commonly known as: Toprol  XL Take 1 tablet (25 mg total) by mouth in the morning and at  bedtime.   Multi-Vitamins Tabs Take 1 tablet by mouth daily.   NATTOKINASE PO Take by mouth.   OVER THE COUNTER MEDICATION Take 1 tablet by mouth daily. NMN   Repatha   SureClick 140 MG/ML Soaj Generic drug: Evolocumab  INJECT 140MG  UNDER THE SKIN EVERY 14 DAYS   tadalafil  5 MG tablet Commonly known as: CIALIS  Take 1 tablet (5 mg total) by mouth daily.   VITAMIN B-12 PO Take 2,500 mcg by mouth every other day.   VITAMIN K2-VITAMIN D3 PO Take 1 tablet by mouth every other day.       All past medical history, surgical history, allergies, family history, immunizations andmedications were updated in the EMR today and reviewed under the history and medication portions of their EMR.      ROS 14 pt review of systems performed and negative (unless mentioned in an HPI)  Objective: BP 98/62   Pulse 93   Temp 97.8 F (36.6 C)   Wt 187 lb (84.8 kg)   SpO2 98%   BMI 26.83 kg/m  Physical Exam Vitals and nursing note reviewed.  Constitutional:      General: He is not in acute distress.    Appearance: Normal appearance. He is not ill-appearing, toxic-appearing or diaphoretic.  HENT:     Head: Normocephalic and atraumatic.  Eyes:     General: No scleral icterus.       Right eye: No discharge.        Left eye: No discharge.     Extraocular Movements: Extraocular movements intact.     Pupils: Pupils are equal, round, and reactive to light.  Cardiovascular:     Rate and Rhythm: Normal rate and regular rhythm.  Pulmonary:     Effort: Pulmonary effort is normal. No respiratory distress.     Breath sounds: Normal breath sounds. No wheezing, rhonchi or rales.  Musculoskeletal:     Right lower leg: No edema.     Left lower leg: No edema.  Skin:    General: Skin is warm.     Findings: No rash.  Neurological:     Mental Status: He is alert and oriented to person, place, and time. Mental status is at baseline.  Psychiatric:        Mood and Affect: Mood normal.        Behavior:  Behavior normal.        Thought Content: Thought content normal.        Judgment: Judgment normal.     No results found.  Assessment/plan: LINDEL MARCELL is a 73 y.o. male present for routine chronic condition follow-up Essential hypertension/hyperlipidemia/NYHA class 1 heart failure with reduced ejection fraction (HCC)-20%/statin myopathy All cardiac medications managed by cardiology.  Male hypogonadism/male erectile dysfunction/Benign prostatic hyperplasia without lower urinary tract symptoms Stable Continue Cialis  5 mg daily.  Printed and handed prescription him today. NYHA class I heart failure patient.  He understands if his heart failure progresses, BP < 90/50 or he has symptoms or he is in need of nitrates, he will need to discontinue Cialis  use. Encouraged him to keep an open discussion with his cardiology team surrounding safety of this medication  BMP collected today to ensure dose is appropriate   Return in about 1 year (around 05/16/2024) for Routine chronic condition follow-up.   Orders Placed This Encounter  Procedures   Basic Metabolic Panel (BMET)   Meds ordered this encounter  Medications   tadalafil  (CIALIS ) 5 MG tablet    Sig: Take 1 tablet (5 mg total) by mouth daily.    Dispense:  90 tablet    Refill:  3   Referral Orders  No referral(s) requested today     Note  is dictated utilizing voice recognition software. Although note has been proof read prior to signing, occasional typographical errors still can be missed. If any questions arise, please do not hesitate to call for verification.  Electronically signed by: Charlies Bellini, DO [facility] [place]

## 2023-05-16 NOTE — Patient Instructions (Signed)
 Return in about 1 year (around 05/16/2024) for Routine chronic condition follow-up.        Great to see you today.  I have refilled the medication(s) we provide.   If labs were collected or images ordered, we will inform you of  results once we have received them and reviewed. We will contact you either by echart message, or telephone call.  Please give ample time to the testing facility, and our office to run,  receive and review results. Please do not call inquiring of results, even if you can see them in your chart. We will contact you as soon as we are able. If it has been over 1 week since the test was completed, and you have not yet heard from us , then please call us .    - echart message- for normal results that have been seen by the patient already.   - telephone call: abnormal results or if patient has not viewed results in their echart.  If a referral to a specialist was entered for you, please call us  in 2 weeks if you have not heard from the specialist office to schedule.

## 2023-05-22 ENCOUNTER — Ambulatory Visit: Payer: PPO | Attending: Pulmonary Disease | Admitting: Pulmonary Disease

## 2023-05-22 ENCOUNTER — Encounter: Payer: Self-pay | Admitting: Pulmonary Disease

## 2023-05-22 VITALS — BP 96/60 | HR 57 | Ht 70.0 in | Wt 186.0 lb

## 2023-05-22 DIAGNOSIS — I255 Ischemic cardiomyopathy: Secondary | ICD-10-CM | POA: Diagnosis not present

## 2023-05-22 DIAGNOSIS — Z95810 Presence of automatic (implantable) cardiac defibrillator: Secondary | ICD-10-CM | POA: Diagnosis not present

## 2023-05-22 LAB — CUP PACEART INCLINIC DEVICE CHECK
Date Time Interrogation Session: 20250120153006
HighPow Impedance: 121 Ohm
Implantable Lead Connection Status: 753985
Implantable Lead Implant Date: 20120913
Implantable Lead Location: 753860
Implantable Lead Model: 293
Implantable Lead Serial Number: 107958
Implantable Pulse Generator Implant Date: 20120913
Lead Channel Impedance Value: 490 Ohm
Lead Channel Pacing Threshold Amplitude: 0.7 V
Lead Channel Pacing Threshold Pulse Width: 0.5 ms
Lead Channel Sensing Intrinsic Amplitude: 11 mV
Lead Channel Setting Pacing Amplitude: 2 V
Lead Channel Setting Pacing Pulse Width: 0.5 ms
Lead Channel Setting Sensing Sensitivity: 0.6 mV
Pulse Gen Serial Number: 105130
Zone Setting Status: 755011

## 2023-05-22 NOTE — Patient Instructions (Addendum)
Medication Instructions:  Your physician recommends that you continue on your current medications as directed. Please refer to the Current Medication list given to you today.  *If you need a refill on your cardiac medications before your next appointment, please call your pharmacy*  Lab Work: None ordered.  If you have labs (blood work) drawn today and your tests are completely normal, you will receive your results only by: MyChart Message (if you have MyChart) OR A paper copy in the mail If you have any lab test that is abnormal or we need to change your treatment, we will call you to review the results.  Testing/Procedures: None ordered.  Follow-Up: At [facility], you and your health needs are our priority.  As part of our continuing mission to provide you with exceptional heart care, we have created designated Provider Care Teams.  These Care Teams include your primary Cardiologist (physician) and Advanced Practice Providers (APPs -  Physician Assistants and Nurse Practitioners) who all work together to provide you with the care you need, when you need it.    Your next appointment:   1 year(s)  The format for your next appointment:   In Person  Provider:   Dr Camnitz{or one of the following Advanced Practice Providers on your designated Care Team:   Francis Dowse, PA-C Casimiro Needle "Mardelle Matte" [place], [place] Earnest Rosier, NP  Remote monitoring is used to monitor your Pacemaker/ ICD from home. This monitoring reduces the number of office visits required to check your device to one time per year. It allows [place] to keep an eye on the functioning of your device to ensure it is working properly.   Important Information About Sugar

## 2023-05-22 NOTE — Progress Notes (Signed)
  Electrophysiology Office Note:   Date:  05/22/2023  ID:  [name] Hall, DOB 1951-03-01, MRN [id number]  Primary Cardiologist: Jodelle Red, MD Primary Heart Failure: None Electrophysiologist: Will Jorja Loa, MD       History of Present Illness:   [name] Hall is a 73 y.o. male with h/o HFrEF / ICM s/p ICD, HTN, HLD (statin intolerant), MI, CABGx4, LV thrombus 2019 (treated with apixaban, since stopped with negative f/u ECHO) OSA, fibromyalgia, seen today for routine electrophysiology followup.   Since last being seen in our clinic the patient reports doing very well overall. He eats a largely vegan diet. Follows with Dr. Cristal Deer.  He continues to play golf. No specific device related concerns.    He denies chest pain, palpitations, dyspnea, PND, orthopnea, nausea, vomiting, dizziness, syncope, edema, weight gain, or early satiety.   Review of systems complete and found to be negative unless listed in HPI.   EP Information / Studies Reviewed:    EKG is not ordered today. EKG from 06/29/22 reviewed which showed SB 55 bpm      ICD Interrogation-  reviewed in detail today,  See PACEART report.  Device History: Magazine features editor ICD implanted 01/13/2011 for ICM. Shock impedance alert programmed at 150.  History of appropriate therapy: No History of AAD therapy: No   Studies:  ECHO 03/2021 > LVEF 20-25%, GII DD       Physical Exam:   VS:  BP 96/60   Pulse (!) 57   Ht 5\' 10"  (1.778 m)   Wt 186 lb (84.4 kg)   SpO2 99%   BMI 26.69 kg/m    Wt Readings from Last 3 Encounters:  05/22/23 186 lb (84.4 kg)  05/16/23 187 lb (84.8 kg)  01/17/23 179 lb 9.6 oz (81.5 kg)     GEN: Well nourished, well developed in no acute distress NECK: No JVD; No carotid bruits CARDIAC: Regular rate and rhythm, no murmurs, rubs, gallops RESPIRATORY:  Clear to auscultation without rales, wheezing or rhonchi  ABDOMEN: Soft, non-tender, non-distended EXTREMITIES:   No edema; No deformity   ASSESSMENT AND PLAN:    Chronic Systolic Dysfunction, ICM s/p [employer] single chamber ICD  -euvolemic today -Stable on an appropriate medical regimen -Normal ICD function -See Pace Art report -No changes today    Disposition:   Follow up with Dr. Elberta Fortis or EP APP in 12 months   Signed, Canary Brim, NP-C, AGACNP-BC [facility] - Electrophysiology  05/22/2023, 3:29 PM

## 2023-05-24 ENCOUNTER — Other Ambulatory Visit (HOSPITAL_COMMUNITY): Payer: Self-pay

## 2023-05-24 ENCOUNTER — Telehealth: Payer: Self-pay

## 2023-05-24 NOTE — Telephone Encounter (Signed)
Pharmacy Patient Advocate Encounter   Received notification from CoverMyMeds that prior authorization for REPATHA is required/requested.   Insurance verification completed.   The patient is insured through [facility] ADVANTAGE/RX ADVANCE .   Per test claim: PA required; PA submitted to above mentioned insurance via CoverMyMeds Key/confirmation #/EOC [id number] Status is pending

## 2023-05-26 ENCOUNTER — Other Ambulatory Visit (HOSPITAL_COMMUNITY): Payer: Self-pay

## 2023-05-26 NOTE — Telephone Encounter (Signed)
Pharmacy Patient Advocate Encounter  Received notification from [facility] ADVANTAGE/RX ADVANCE that Prior Authorization for REPATHA has been APPROVED from 05/25/23 to 05/24/24. Unable to obtain price due to refill too soon rejection, last fill date 05/26/23 next available fill date2/14/25

## 2023-06-27 DIAGNOSIS — L309 Dermatitis, unspecified: Secondary | ICD-10-CM | POA: Diagnosis not present

## 2023-06-27 DIAGNOSIS — L905 Scar conditions and fibrosis of skin: Secondary | ICD-10-CM | POA: Diagnosis not present

## 2023-06-27 DIAGNOSIS — Z85828 Personal history of other malignant neoplasm of skin: Secondary | ICD-10-CM | POA: Diagnosis not present

## 2023-06-27 DIAGNOSIS — L821 Other seborrheic keratosis: Secondary | ICD-10-CM | POA: Diagnosis not present

## 2023-06-27 DIAGNOSIS — L570 Actinic keratosis: Secondary | ICD-10-CM | POA: Diagnosis not present

## 2023-06-27 DIAGNOSIS — Z85820 Personal history of malignant melanoma of skin: Secondary | ICD-10-CM | POA: Diagnosis not present

## 2023-06-27 DIAGNOSIS — D225 Melanocytic nevi of trunk: Secondary | ICD-10-CM | POA: Diagnosis not present

## 2023-06-27 DIAGNOSIS — L814 Other melanin hyperpigmentation: Secondary | ICD-10-CM | POA: Diagnosis not present

## 2023-07-13 ENCOUNTER — Other Ambulatory Visit: Payer: Self-pay

## 2023-07-13 ENCOUNTER — Inpatient Hospital Stay

## 2023-07-13 ENCOUNTER — Inpatient Hospital Stay: Payer: PPO | Attending: Hematology and Oncology | Admitting: Hematology and Oncology

## 2023-07-13 VITALS — BP 117/67 | HR 54 | Temp 98.1°F | Resp 16 | Wt 190.1 lb

## 2023-07-13 DIAGNOSIS — D759 Disease of blood and blood-forming organs, unspecified: Secondary | ICD-10-CM

## 2023-07-13 DIAGNOSIS — R040 Epistaxis: Secondary | ICD-10-CM | POA: Diagnosis not present

## 2023-07-13 DIAGNOSIS — M160 Bilateral primary osteoarthritis of hip: Secondary | ICD-10-CM | POA: Insufficient documentation

## 2023-07-13 DIAGNOSIS — D696 Thrombocytopenia, unspecified: Secondary | ICD-10-CM | POA: Diagnosis not present

## 2023-07-13 DIAGNOSIS — R634 Abnormal weight loss: Secondary | ICD-10-CM | POA: Diagnosis not present

## 2023-07-13 DIAGNOSIS — Z7982 Long term (current) use of aspirin: Secondary | ICD-10-CM | POA: Diagnosis not present

## 2023-07-13 DIAGNOSIS — Z86718 Personal history of other venous thrombosis and embolism: Secondary | ICD-10-CM | POA: Insufficient documentation

## 2023-07-13 DIAGNOSIS — D72819 Decreased white blood cell count, unspecified: Secondary | ICD-10-CM | POA: Diagnosis not present

## 2023-07-13 DIAGNOSIS — R5383 Other fatigue: Secondary | ICD-10-CM | POA: Insufficient documentation

## 2023-07-13 DIAGNOSIS — Z8042 Family history of malignant neoplasm of prostate: Secondary | ICD-10-CM | POA: Diagnosis not present

## 2023-07-13 DIAGNOSIS — G479 Sleep disorder, unspecified: Secondary | ICD-10-CM | POA: Insufficient documentation

## 2023-07-13 DIAGNOSIS — I252 Old myocardial infarction: Secondary | ICD-10-CM | POA: Insufficient documentation

## 2023-07-13 LAB — CBC WITH DIFFERENTIAL (CANCER CENTER ONLY)
Abs Immature Granulocytes: 0.01 10*3/uL (ref 0.00–0.07)
Basophils Absolute: 0 10*3/uL (ref 0.0–0.1)
Basophils Relative: 1 %
Eosinophils Absolute: 0.1 10*3/uL (ref 0.0–0.5)
Eosinophils Relative: 3 %
HCT: 43.5 % (ref 39.0–52.0)
Hemoglobin: 14.3 g/dL (ref 13.0–17.0)
Immature Granulocytes: 0 %
Lymphocytes Relative: 21 %
Lymphs Abs: 0.9 10*3/uL (ref 0.7–4.0)
MCH: 30.4 pg (ref 26.0–34.0)
MCHC: 32.9 g/dL (ref 30.0–36.0)
MCV: 92.6 fL (ref 80.0–100.0)
Monocytes Absolute: 0.5 10*3/uL (ref 0.1–1.0)
Monocytes Relative: 11 %
Neutro Abs: 2.7 10*3/uL (ref 1.7–7.7)
Neutrophils Relative %: 64 %
Platelet Count: 115 10*3/uL — ABNORMAL LOW (ref 150–400)
RBC: 4.7 MIL/uL (ref 4.22–5.81)
RDW: 13 % (ref 11.5–15.5)
WBC Count: 4.3 10*3/uL (ref 4.0–10.5)
nRBC: 0 % (ref 0.0–0.2)

## 2023-07-13 NOTE — Progress Notes (Signed)
 [facility] CONSULT NOTE  Patient Care Team: Natalia Leatherwood, DO as PCP - General (Family Medicine) Jodelle Red, MD as PCP - Cardiology (Cardiology) Regan Lemming, MD as PCP - Electrophysiology (Cardiology) Jodelle Red, MD as Consulting Physician (Cardiology) Rachel Moulds, MD as Consulting Physician (Hematology and Oncology) Hillis Range, MD (Inactive) as Consulting Physician (Cardiology) Arminda Resides, MD as Consulting Physician (Dermatology)  CHIEF COMPLAINTS/PURPOSE OF CONSULTATION:  Cytopenia of unknown significance.  ASSESSMENT & PLAN:   This is a very pleasant 73 year old male patient with past medical history significant for coronary artery disease, congestive heart failure and ischemic cardiomyopathy referred to hematology for evaluation of thrombocytopenia.   Fatigue and weight gain Low testosterone considered due to past testosterone therapy causing thrombosis. Testosterone therapy increases clotting risk, requiring concurrent anticoagulation.  - Send blood sample for testosterone level testing. - Consider nutritional interventions for fatigue and weight gain.  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  Advised against testosterone therapy without anticoagulation.  Thrombocytopenia Stable, no intervention needed..  Sleep disturbances Reports 4-5 hours of sleep with frequent awakenings. Plans to adjust melatonin timing for effectiveness. - Take melatonin 1-2 hours before bedtime.  Follow-up FU in 1 yr, pt preference. - Advise to call if experiencing new or concerning symptoms, such as bleeding.  Thank you for consulting [place] in the care of this patient.  Please do not hesitate to contact us with any additional questions or concerns  HISTORY OF PRESENTING ILLNESS:   [name] Hall 73 y.o. male is here because of Thrombocytopenia  This is a very pleasant  73 year old male patient with past medical history significant for coronary artery disease, congestive heart failure, hypertension, thrombocytopenia referred to hematology for thrombocytopenia.    Interval history  Discussed the use of AI scribe software for clinical note transcription with the patient, who gave verbal consent to proceed.  History of Present Illness    The patient, with a history of thrombosis and heart attack, thrombocytopenia presents with fatigue and weight gain.  He experiences fatigue and weight gain around the middle, noting a decrease in energy levels. He attributes some of the weight gain to eating later at night, closer to bedtime. He hopes to become more active with the change in weather.  He has a history of thrombosis in the heart, which occurred in 2012 and was associated with testosterone injections at the time. This thrombosis led to a heart attack, damaging part of the lower heart. He was on Eliquis, a blood thinner, following the event but is no longer taking it. He currently takes baby aspirin as a blood thinner.  He reports recent epistaxis, which he attributes to the dry weather and low humidity in his home, noting the humidity level is around 20%. No other bleeding issues such as bleeding from the gums or other sites. No fevers or night sweats.  Regarding sleep, he typically gets four to five hours of sleep and often wakes up, trying to get more rest. He has used melatonin in the past and plans to try it again, taking it a couple of hours before bedtime to avoid grogginess.   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)    Myocardial infarction (HCC)    2012     Sleep apnea    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   Thrombocytopenia (HCC)    Unilateral primary osteoarthritis, left hip 12/18/2017   Unilateral primary osteoarthritis, right hip 12/18/2017   Vegan diet     SURGICAL HISTORY: Past Surgical History:  Procedure Laterality Date   APPENDECTOMY     CARDIAC CATHETERIZATION  2012   X2 with 2 stents    COLONOSCOPY WITH PROPOFOL N/A 07/18/2022   Procedure: COLONOSCOPY WITH PROPOFOL;  Surgeon: Lynann Bologna, MD;  Location: [facility];  Service: Gastroenterology;  Laterality: N/A;   CORONARY ARTERY BYPASS GRAFT  2007   quadruple    ICD IMPLANT  2012   JOINT REPLACEMENT  2019   Left Hip   partial discectomy   1995   POLYPECTOMY  07/18/2022   Procedure: POLYPECTOMY;  Surgeon: Lynann Bologna, MD;  Location: [facility];  Service: Gastroenterology;;   SPINAL FUSION  2003   lumbar    TOTAL HIP ARTHROPLASTY Left 02/15/2018   Procedure: LEFT TOTAL HIP ARTHROPLASTY ANTERIOR APPROACH;  Surgeon: Kathryne Hitch, MD;  Location: [facility];  Service: Orthopedics;  Laterality: Left;    SOCIAL HISTORY: Social History   Socioeconomic History   Marital status: Married    Spouse name: Not on file   Number of children: Not on file   Years of education: Not on file   Highest education level: Some college, no degree  Occupational History   Not on file  Tobacco Use   Smoking status: Never    Passive exposure: Never   Smokeless tobacco: Never  Vaping Use   Vaping status: Never Used  Substance and Sexual Activity   Alcohol use: Not Currently    Comment: occ   Drug use: Never   Sexual activity: Yes    Partners: Female     Birth control/protection: None    Comment: married  Other Topics Concern   Not on file  Social History Narrative   Marital status/children/pets: Married.   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   Safety:      -Wears a bicycle helmet riding a bike: Yes     -smoke alarm in the home:Yes     - wears seatbelt: Yes         Social Drivers of Health   Financial Resource Strain: Patient Declined (05/15/2023)   Overall Financial Resource Strain (CARDIA)    Difficulty of Paying Living Expenses: Patient declined  Food Insecurity: No Food Insecurity (05/15/2023)   Hunger Vital Sign    Worried About Running Out of Food in the Last Year: Never true    Ran Out of Food in the Last Year: Never true  Transportation Needs: No Transportation Needs (05/15/2023)   PRAPARE - Administrator, Civil Service (Medical): No    Lack of Transportation (Non-Medical): No  Physical Activity: Insufficiently Active (05/15/2023)   Exercise Vital Sign    Days of Exercise per Week: 3  days    Minutes of Exercise per Session: 30 min  Stress: No Stress Concern Present (05/15/2023)   [employer] of Occupational Health - Occupational Stress Questionnaire    Feeling of Stress : Only a little  Social Connections: Unknown (05/15/2023)   Social Connection and Isolation Panel [NHANES]    Frequency of Communication with Friends and Family: Patient declined    Frequency of Social Gatherings with Friends and Family: Patient declined    Attends Religious Services: Never    Database administrator or Organizations: No    Attends Engineer, structural: Not on file    Marital Status: Married  Catering manager Violence: Not At Risk (11/17/2021)   Humiliation, Afraid, Rape, and Kick questionnaire    Fear of Current or Ex-Partner: No    Emotionally Abused: No    Physically Abused: No    Sexually Abused: No    FAMILY HISTORY: Family History  Problem Relation Age of Onset   Breast  cancer Mother    Depression Mother    Alcohol abuse Mother    Hypertension Father    Prostate cancer Father    Heart disease Father    Thyroid disease Sister     ALLERGIES:  is allergic to crestor [rosuvastatin], lipitor [atorvastatin], morphine and codeine, other, and statins.  MEDICATIONS:  Current Outpatient Medications  Medication Sig Dispense Refill   aspirin EC 81 MG tablet Take 81 mg by mouth daily.     Cholecalciferol 25 MCG (1000 UT) tablet Take 1,000-2,000 Units by mouth every other day.     Cyanocobalamin (VITAMIN B-12 PO) Take 2,500 mcg by mouth every other day.     lisinopril (ZESTRIL) 5 MG tablet TAKE 1 TABLET BY MOUTH DAILY 90 tablet 1   metoprolol succinate (TOPROL XL) 25 MG 24 hr tablet Take 1 tablet (25 mg total) by mouth in the morning and at bedtime. 180 tablet 3   Multiple Vitamin (MULTI-VITAMINS) TABS Take 1 tablet by mouth daily.      NATTOKINASE PO Take by mouth.     OVER THE COUNTER MEDICATION Take 1 tablet by mouth daily. NMN     REPATHA SURECLICK 140 MG/ML SOAJ INJECT 140MG  UNDER THE SKIN EVERY 14 DAYS 2 mL 11   tadalafil (CIALIS) 5 MG tablet Take 1 tablet (5 mg total) by mouth daily. 90 tablet 3   Vitamin D-Vitamin K (VITAMIN K2-VITAMIN D3 PO) Take 1 tablet by mouth every other day.     No current facility-administered medications for this visit.     PHYSICAL EXAMINATION:  ECOG PERFORMANCE STATUS: 0 - Asymptomatic  Vitals:   07/13/23 1341  BP: 117/67  Pulse: (!) 54  Resp: 16  Temp: 98.1 F (36.7 C)  SpO2: 100%     Filed Weights   07/13/23 1341  Weight: 190 lb 1.6 oz (86.2 kg)     Physical Exam Constitutional:      Appearance: Normal appearance.  HENT:     Head: Normocephalic and atraumatic.  Cardiovascular:     Rate and Rhythm: Normal rate and regular rhythm.     Pulses: Normal pulses.     Heart sounds: Normal heart sounds.  Pulmonary:     Effort: Pulmonary effort is normal.     Breath sounds: Normal breath sounds.  Abdominal:      General: Abdomen is flat.     Palpations: Abdomen is soft.  Musculoskeletal:        General: Normal range of motion.  Skin:  General: Skin is warm and dry.  Neurological:     General: No focal deficit present.     Mental Status: He is alert.  Psychiatric:        Mood and Affect: Mood normal.      LABORATORY DATA:  I have reviewed the data as listed Lab Results  Component Value Date   WBC 4.3 07/13/2023   HGB 14.3 07/13/2023   HCT 43.5 07/13/2023   MCV 92.6 07/13/2023   PLT 115 (L) 07/13/2023     Chemistry      Component Value Date/Time   NA 139 05/16/2023 1153   NA 139 05/06/2020 0000   K 5.0 05/16/2023 1153   CL 105 05/16/2023 1153   CO2 28 05/16/2023 1153   BUN 13 05/16/2023 1153   BUN 11 05/06/2020 0000   CREATININE 0.98 05/16/2023 1153   CREATININE 0.95 07/02/2020 1232   GLU 78 05/06/2020 0000      Component Value Date/Time   CALCIUM 9.1 05/16/2023 1153   ALKPHOS 101 01/11/2022 1001   AST 16 01/11/2022 1001   AST 25 07/02/2020 1232   ALT 9 01/11/2022 1001   ALT 11 07/02/2020 1232   BILITOT 0.5 01/11/2022 1001   BILITOT 0.6 07/02/2020 1232     TSH of 1.8 B12 of 573 Folate of 18 WBC of 3.7 Hep C ab negative. Multiple labs reviewed CBC ranging from 3700 to 4100, platelets mildly low in 120 K 2 yr ago, mild thrombocytopenia, but WBC Count was normal 9 yrs ago, again thrombocytopenia, plt count in 140K, no leukopenia Iron panel and ferritin normal. No evidence of hemolysis  Labs from today with plt count of 115K  RADIOGRAPHIC STUDIES: I have personally reviewed the radiological images as listed and agreed with the findings in the report. No results found.  All questions were answered. The patient knows to call the clinic with any problems, questions or concerns. I spent 30 minutes in the care of this patient including H and P, review of records, counseling and coordination of care.     Rachel Moulds, MD 07/13/2023 1:51 PM

## 2023-07-14 ENCOUNTER — Encounter: Payer: Self-pay | Admitting: Hematology and Oncology

## 2023-07-14 ENCOUNTER — Other Ambulatory Visit: Payer: Self-pay

## 2023-07-14 DIAGNOSIS — D696 Thrombocytopenia, unspecified: Secondary | ICD-10-CM

## 2023-07-14 DIAGNOSIS — D72819 Decreased white blood cell count, unspecified: Secondary | ICD-10-CM

## 2023-07-15 LAB — TESTOSTERONE: Testosterone: 520 ng/dL (ref 264–916)

## 2023-07-26 ENCOUNTER — Encounter

## 2023-07-26 ENCOUNTER — Ambulatory Visit: Payer: PPO

## 2023-07-26 LAB — CUP PACEART REMOTE DEVICE CHECK
Battery Remaining Longevity: 18 mo
Battery Remaining Percentage: 21 %
Brady Statistic RV Percent Paced: 1 %
Date Time Interrogation Session: 20250326040200
HighPow Impedance: 116 Ohm
Implantable Lead Connection Status: 753985
Implantable Lead Implant Date: 20120913
Implantable Lead Location: 753860
Implantable Lead Model: 293
Implantable Lead Serial Number: 107958
Implantable Pulse Generator Implant Date: 20120913
Lead Channel Impedance Value: 429 Ohm
Lead Channel Pacing Threshold Amplitude: 0.7 V
Lead Channel Pacing Threshold Pulse Width: 0.5 ms
Lead Channel Setting Pacing Amplitude: 2 V
Lead Channel Setting Pacing Pulse Width: 0.5 ms
Lead Channel Setting Sensing Sensitivity: 0.6 mV
Pulse Gen Serial Number: 105130
Zone Setting Status: 755011

## 2023-07-26 NOTE — Telephone Encounter (Signed)
 ERRONEOUS ENCOUNTER

## 2023-07-27 LAB — CUP PACEART REMOTE DEVICE CHECK
Battery Remaining Longevity: 18 mo
Battery Remaining Percentage: 21 %
Brady Statistic RV Percent Paced: 1 %
Date Time Interrogation Session: 20250326040200
HighPow Impedance: 116 Ohm
Implantable Lead Connection Status: 753985
Implantable Lead Implant Date: 20120913
Implantable Lead Location: 753860
Implantable Lead Model: 293
Implantable Lead Serial Number: 107958
Implantable Pulse Generator Implant Date: 20120913
Lead Channel Impedance Value: 429 Ohm
Lead Channel Pacing Threshold Amplitude: 0.7 V
Lead Channel Pacing Threshold Pulse Width: 0.5 ms
Lead Channel Setting Pacing Amplitude: 2 V
Lead Channel Setting Pacing Pulse Width: 0.5 ms
Lead Channel Setting Sensing Sensitivity: 0.6 mV
Pulse Gen Serial Number: 105130
Zone Setting Status: 755011

## 2023-09-16 ENCOUNTER — Other Ambulatory Visit: Payer: Self-pay | Admitting: Cardiology

## 2023-09-16 DIAGNOSIS — I255 Ischemic cardiomyopathy: Secondary | ICD-10-CM

## 2023-09-18 MED ORDER — METOPROLOL SUCCINATE ER 25 MG PO TB24: 25.0000 mg | ORAL_TABLET | Freq: Two times a day (BID) | ORAL | 0 refills | Status: DC

## 2023-10-05 ENCOUNTER — Encounter (HOSPITAL_BASED_OUTPATIENT_CLINIC_OR_DEPARTMENT_OTHER): Payer: Self-pay

## 2023-10-06 NOTE — Telephone Encounter (Signed)
**Note De-identified  [place] Obfuscation** Please advise 

## 2023-10-25 ENCOUNTER — Ambulatory Visit (INDEPENDENT_AMBULATORY_CARE_PROVIDER_SITE_OTHER): Payer: PPO

## 2023-10-25 DIAGNOSIS — L821 Other seborrheic keratosis: Secondary | ICD-10-CM | POA: Diagnosis not present

## 2023-10-25 DIAGNOSIS — D225 Melanocytic nevi of trunk: Secondary | ICD-10-CM | POA: Diagnosis not present

## 2023-10-25 DIAGNOSIS — I255 Ischemic cardiomyopathy: Secondary | ICD-10-CM

## 2023-10-25 DIAGNOSIS — Z85820 Personal history of malignant melanoma of skin: Secondary | ICD-10-CM | POA: Diagnosis not present

## 2023-10-25 DIAGNOSIS — Z85828 Personal history of other malignant neoplasm of skin: Secondary | ICD-10-CM | POA: Diagnosis not present

## 2023-10-25 DIAGNOSIS — L905 Scar conditions and fibrosis of skin: Secondary | ICD-10-CM | POA: Diagnosis not present

## 2023-10-26 LAB — CUP PACEART REMOTE DEVICE CHECK
Battery Remaining Longevity: 12 mo
Battery Remaining Percentage: 15 %
Brady Statistic RV Percent Paced: 1 %
Date Time Interrogation Session: 20250625161500
HighPow Impedance: 116 Ohm
Implantable Lead Connection Status: 753985
Implantable Lead Implant Date: 20120913
Implantable Lead Location: 753860
Implantable Lead Model: 293
Implantable Lead Serial Number: 107958
Implantable Pulse Generator Implant Date: 20120913
Lead Channel Impedance Value: 442 Ohm
Lead Channel Pacing Threshold Amplitude: 0.7 V
Lead Channel Pacing Threshold Pulse Width: 0.5 ms
Lead Channel Setting Pacing Amplitude: 2 V
Lead Channel Setting Pacing Pulse Width: 0.5 ms
Lead Channel Setting Sensing Sensitivity: 0.6 mV
Pulse Gen Serial Number: 105130
Zone Setting Status: 755011

## 2023-10-29 ENCOUNTER — Ambulatory Visit: Payer: Self-pay | Admitting: Cardiology

## 2023-11-08 NOTE — Progress Notes (Unsigned)
 [name] Hall [address] [place] [address] Phone: [phone] Subjective:   [name] Hall am a scribe for Dr. Claudene.   I'm seeing this patient by the request  of:  Kuneff, Renee A, DO  CC: Left shoulder pain  YEP:Dlagzrupcz  [name] Hall is a 73 y.o. male coming in with complaint of L shoulder pain. Patient states no injury to cause the pain. Pain does not wake him up at night. Some discomfort going to sleep. Did not try ice or heat. When it initially happened the pain was really bad but right now it is ok. ER of the shoulder, supination of hand, and pushing up out of a chair is painful.   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   Thrombocytopenia (HCC)    Unilateral primary osteoarthritis, left hip 12/18/2017   Unilateral primary osteoarthritis, right hip 12/18/2017   Vegan diet    Past Surgical History:  Procedure Laterality Date   APPENDECTOMY     CARDIAC CATHETERIZATION  2012   X2 with 2 stents    COLONOSCOPY WITH PROPOFOL  N/A 07/18/2022   Procedure: COLONOSCOPY WITH PROPOFOL ;  Surgeon: Charlanne Groom, MD;  Location: [facility];  Service: Gastroenterology;  Laterality: N/A;   CORONARY ARTERY BYPASS GRAFT  2007   quadruple    ICD IMPLANT  2012   JOINT REPLACEMENT  2019    Left Hip   partial discectomy   1995   POLYPECTOMY  07/18/2022   Procedure: POLYPECTOMY;  Surgeon: Charlanne Groom, MD;  Location: [facility];  Service: Gastroenterology;;   SPINAL FUSION  2003   lumbar    TOTAL HIP ARTHROPLASTY Left 02/15/2018   Procedure: LEFT TOTAL HIP ARTHROPLASTY ANTERIOR APPROACH;  Surgeon: Vernetta Lonni GRADE, MD;  Location: [facility];  Service: Orthopedics;  Laterality: Left;   Social History   Socioeconomic History   Marital status: Married    Spouse name: Not on file   Number of children: Not on file   Years of education: Not on file   Highest education level: Some college, no degree  Occupational History   Not on file  Tobacco Use   Smoking status: Never    Passive exposure: Never   Smokeless tobacco: Never  Vaping Use   Vaping status: Never Used  Substance and Sexual Activity   Alcohol use: Not Currently    Comment: occ   Drug use: Never   Sexual activity: Yes    Partners: Female    Birth control/protection: None    Comment: married  Other Topics Concern   Not on file  Social History Narrative   Marital status/children/pets: Married.   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   Safety:      -Wears a bicycle helmet riding a bike: Yes     -smoke alarm in the home:Yes     - wears seatbelt: Yes         Social Drivers of Health   Financial Resource Strain: Patient Declined (05/15/2023)   Overall Financial Resource Strain (CARDIA)    Difficulty of Paying Living Expenses: Patient declined  Food Insecurity: No Food Insecurity (05/15/2023)   Hunger Vital Sign    Worried About Running Out of Food in the Last Year: Never true    Ran Out of Food in the Last Year: Never true  Transportation Needs: No Transportation Needs (05/15/2023)   PRAPARE - Administrator, Civil Service (Medical): No    Lack of Transportation (Non-Medical): No  Physical Activity: Insufficiently Active (05/15/2023)   Exercise Vital Sign    Days  of Exercise per Week: 3 days    Minutes of Exercise per Session: 30 min  Stress: No Stress Concern Present (05/15/2023)   [employer] of Occupational Health - Occupational Stress Questionnaire    Feeling of Stress : Only a little  Social Connections: Unknown (05/15/2023)   Social Connection and Isolation Panel    Frequency of Communication with Friends and Family: Patient declined    Frequency of Social Gatherings with Friends and Family: Patient declined    Attends Religious Services: Never    Database administrator or Organizations: No    Attends Engineer, structural: Not on file    Marital Status: Married   Allergies  Allergen Reactions   Crestor [Rosuvastatin]     myalgias   Lipitor [Atorvastatin]     myalgias   Morphine And Codeine     Nausea   Other Other (See Comments)    VEGAN ( no dairy , no meat, ,no eggs);  Fish 4-5 times a year   Statins     Muscle pain   Family History  Problem Relation Age of Onset   Breast cancer Mother    Depression Mother    Alcohol abuse Mother    Hypertension Father    Prostate cancer Father    Heart disease Father    Thyroid  disease Sister      Current Outpatient Medications (Cardiovascular):    lisinopril  (ZESTRIL ) 5 MG tablet, TAKE 1 TABLET BY MOUTH DAILY   metoprolol  succinate (TOPROL  XL) 25 MG 24 hr tablet, Take 1 tablet (25 mg total) by mouth in the morning and at bedtime.   REPATHA  SURECLICK 140 MG/ML SOAJ, INJECT 140MG  UNDER THE SKIN EVERY 14 DAYS   tadalafil  (CIALIS ) 5 MG tablet, Take 1 tablet (5 mg total) by mouth daily.   Current Outpatient Medications (Analgesics):    aspirin  EC 81 MG tablet, Take 81 mg by mouth daily.  Current Outpatient Medications (Hematological):    Cyanocobalamin (VITAMIN B-12 PO), Take 2,500 mcg by mouth every other day.  Current Outpatient Medications (Other):    Cholecalciferol 25 MCG (1000 UT) tablet, Take 1,000-2,000 Units by mouth every other day.   Multiple Vitamin  (MULTI-VITAMINS) TABS, Take 1 tablet by mouth daily.    OVER THE COUNTER MEDICATION, Take 1 tablet by mouth daily. NMN   Vitamin D-Vitamin K (VITAMIN K2-VITAMIN D3 PO), Take 1 tablet by mouth every other day.   Reviewed prior external information including notes and imaging from  primary care provider As well as notes that were available from care everywhere and other healthcare  systems.  Past medical history, social, surgical and family history all reviewed in electronic medical record.  No pertanent information unless stated regarding to the chief complaint.   Review of Systems:  No headache, visual changes, nausea, vomiting, diarrhea, constipation, dizziness, abdominal pain, skin rash, fevers, chills, night sweats, weight loss, swollen lymph nodes, body aches, joint swelling, chest pain, shortness of breath, mood changes. POSITIVE muscle aches  Objective  Blood pressure 112/60, pulse 60, height 5' 10 (1.778 m), weight 184 lb 3.2 oz (83.6 kg).   General: No apparent distress alert and oriented x3 mood and affect normal, dressed appropriately.  HEENT: Pupils equal, extraocular movements intact  Respiratory: Patient's speak in full sentences and does not appear short of breath  Cardiovascular: No lower extremity edema, non tender, no erythema   Left shoulder exam shows patient does have some limited range of motion noted.  Patient does have some tenderness on the anterior aspect of the shoulder.  Rotator cuff strength appears to be intact.  Patient does have some limited external range of motion.  Limited muscular skeletal ultrasound was performed and interpreted by [name] Hall, M  Limited ultrasound shows some hypoechoic changes noted no significant abnormality noted of the anterior labrum.  Patient's rotator cuff appears to be intact but significant arthritic changes noted of the acromioclavicular joint. 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  This included exercises focusing on stretching, strengthening, with significant focus on eccentric aspects.   Long term goals include an improvement in range of motion, strength, endurance as well as avoiding reinjury. Patient's frequency would include in 1-2 times a day, 3-5 times a week for a duration of 6-12 weeks. Shoulder Exercises that included:  Basic scapular stabilization to include adduction and depression of scapula Scaption, focusing on proper movement and good control Internal and External rotation utilizing a theraband, with elbow tucked at side entire time Rows with theraband    Proper technique shown and discussed handout in great detail with ATC.  All questions were discussed and answered.   Impression and Recommendations:     The above documentation has been reviewed and is accurate and complete Cindy Fullman M Amarion Portell, DO

## 2023-11-09 ENCOUNTER — Ambulatory Visit: Admitting: Family Medicine

## 2023-11-09 ENCOUNTER — Other Ambulatory Visit: Payer: Self-pay

## 2023-11-09 ENCOUNTER — Encounter: Payer: Self-pay | Admitting: Family Medicine

## 2023-11-09 VITALS — BP 112/60 | HR 60 | Ht 70.0 in | Wt 184.2 lb

## 2023-11-09 DIAGNOSIS — M25512 Pain in left shoulder: Secondary | ICD-10-CM

## 2023-11-09 NOTE — Patient Instructions (Addendum)
 Do prescribed exercises at least 3x a week Voltaren and ice Arm compression sleeve with repetitive activity 2400mg  tart cherry See you again in 2 months

## 2023-11-09 NOTE — Assessment & Plan Note (Signed)
 Multifactorial overall.  He does have some bicep tendinitis that I think is contributing but also seems to be having difficulty with the acromioclavicular joint.  Home exercises given.  Discussed the possibility of injection which patient would like to hold on at the moment.  Was found to have some calcium deposits that could be secondary to either vitamin D deficiency which patient has been supplementing recently.  Patient given exercises with athletic trainer and I think patient will do well.  Follow-up with me again 6 to 8 weeks.

## 2023-12-13 ENCOUNTER — Other Ambulatory Visit: Payer: Self-pay | Admitting: Family Medicine

## 2023-12-13 NOTE — Telephone Encounter (Signed)
 Copied from CRM [id number]. Topic: Clinical - Medication Refill >> Dec 13, 2023 10:36 AM Rea C wrote: Medication: tadalafil  (CIALIS ) 5 MG tablet  Has the patient contacted their pharmacy? Patient put in refill but called [facility] and have not received the script. Patient stated that he usually gets a 90 day refill. But if he can get a 30 day just to hold him over because he is currently out. Patient will also make an appointment as well.   This is the patient's preferred pharmacy:  [facility] [address], [place] - [address] [place] [address] Phone: [phone] Fax: [phone]  Is this the correct pharmacy for this prescription? Yes If no, delete pharmacy and type the correct one.   Has the prescription been filled recently? No  Is the patient out of the medication? Yes  Has the patient been seen for an appointment in the last year OR does the patient have an upcoming appointment? Yes  Can we respond through MyChart? Yes  Agent: Please be advised that Rx refills may take up to 3 business days. We ask that you follow-up with your pharmacy.

## 2023-12-14 ENCOUNTER — Other Ambulatory Visit: Payer: Self-pay | Admitting: Cardiology

## 2023-12-14 ENCOUNTER — Encounter: Payer: Self-pay | Admitting: Family Medicine

## 2023-12-14 ENCOUNTER — Ambulatory Visit (INDEPENDENT_AMBULATORY_CARE_PROVIDER_SITE_OTHER): Admitting: Family Medicine

## 2023-12-14 VITALS — BP 116/64 | HR 55 | Temp 98.1°F | Wt 182.6 lb

## 2023-12-14 DIAGNOSIS — E782 Mixed hyperlipidemia: Secondary | ICD-10-CM

## 2023-12-14 DIAGNOSIS — N401 Enlarged prostate with lower urinary tract symptoms: Secondary | ICD-10-CM | POA: Diagnosis not present

## 2023-12-14 DIAGNOSIS — D696 Thrombocytopenia, unspecified: Secondary | ICD-10-CM

## 2023-12-14 DIAGNOSIS — G720 Drug-induced myopathy: Secondary | ICD-10-CM

## 2023-12-14 DIAGNOSIS — I5020 Unspecified systolic (congestive) heart failure: Secondary | ICD-10-CM

## 2023-12-14 DIAGNOSIS — R351 Nocturia: Secondary | ICD-10-CM | POA: Diagnosis not present

## 2023-12-14 DIAGNOSIS — I5022 Chronic systolic (congestive) heart failure: Secondary | ICD-10-CM | POA: Diagnosis not present

## 2023-12-14 DIAGNOSIS — I10 Essential (primary) hypertension: Secondary | ICD-10-CM | POA: Diagnosis not present

## 2023-12-14 DIAGNOSIS — T466X5A Adverse effect of antihyperlipidemic and antiarteriosclerotic drugs, initial encounter: Secondary | ICD-10-CM

## 2023-12-14 DIAGNOSIS — I2510 Atherosclerotic heart disease of native coronary artery without angina pectoris: Secondary | ICD-10-CM | POA: Diagnosis not present

## 2023-12-14 LAB — CBC
HCT: 43.2 % (ref 39.0–52.0)
Hemoglobin: 14.4 g/dL (ref 13.0–17.0)
MCHC: 33.4 g/dL (ref 30.0–36.0)
MCV: 91.3 fl (ref 78.0–100.0)
Platelets: 114 K/uL — ABNORMAL LOW (ref 150.0–400.0)
RBC: 4.73 Mil/uL (ref 4.22–5.81)
RDW: 14.4 % (ref 11.5–15.5)
WBC: 4.1 K/uL (ref 4.0–10.5)

## 2023-12-14 LAB — LIPID PANEL
Cholesterol: 166 mg/dL (ref 0–200)
HDL: 39.3 mg/dL (ref >39.00)
LDL Cholesterol: 104 mg/dL — ABNORMAL HIGH (ref 0–99)
NonHDL: 126.51
Total CHOL/HDL Ratio: 4
Triglycerides: 113 mg/dL (ref 0.0–149.0)
VLDL: 22.6 mg/dL (ref 0.0–40.0)

## 2023-12-14 LAB — COMPREHENSIVE METABOLIC PANEL WITH GFR
ALT: 12 U/L (ref 0–53)
AST: 19 U/L (ref 0–37)
Albumin: 4.1 g/dL (ref 3.5–5.2)
Alkaline Phosphatase: 97 U/L (ref 39–117)
BUN: 14 mg/dL (ref 6–23)
CO2: 27 meq/L (ref 19–32)
Calcium: 8.6 mg/dL (ref 8.4–10.5)
Chloride: 104 meq/L (ref 96–112)
Creatinine, Ser: 0.95 mg/dL (ref 0.40–1.50)
GFR: 79.85 mL/min (ref >60.00)
Glucose, Bld: 85 mg/dL (ref 70–99)
Potassium: 4.7 meq/L (ref 3.5–5.1)
Sodium: 138 meq/L (ref 135–145)
Total Bilirubin: 0.6 mg/dL (ref 0.2–1.2)
Total Protein: 6.5 g/dL (ref 6.0–8.3)

## 2023-12-14 LAB — TSH: TSH: 2.38 u[IU]/mL (ref 0.35–5.50)

## 2023-12-14 MED ORDER — TADALAFIL 5 MG PO TABS: 5.0000 mg | ORAL_TABLET | Freq: Every day | ORAL | 3 refills | Status: AC

## 2023-12-14 MED ORDER — TADALAFIL 5 MG PO TABS: 5.0000 mg | ORAL_TABLET | Freq: Every day | ORAL | 3 refills | Status: DC

## 2023-12-14 NOTE — Patient Instructions (Signed)
 Return in about 1 year (around 12/09/2024) for Routine chronic condition follow-up.        Great to see you today.  I have refilled the medication(s) we provide.   If labs were collected or images ordered, we will inform you of  results once we have received them and reviewed. We will contact you either by echart message, or telephone call.  Please give ample time to the testing facility, and our office to run,  receive and review results. Please do not call inquiring of results, even if you can see them in your chart. We will contact you as soon as we are able. If it has been over 1 week since the test was completed, and you have not yet heard from us , then please call us .    - echart message- for normal results that have been seen by the patient already.   - telephone call: abnormal results or if patient has not viewed results in their echart.  If a referral to a specialist was entered for you, please call us  in 2 weeks if you have not heard from the specialist office to schedule.

## 2023-12-14 NOTE — Progress Notes (Signed)
 Patient ID: [name] Hall, male  DOB: 1950-06-04, 73 y.o.   MRN: [id number] Patient Care Team    Relationship Specialty Notifications Start End  Catherine Charlies LABOR, DO PCP - General Family Medicine  07/20/21   Lonni Slain, MD PCP - Cardiology Cardiology  06/28/22   Inocencio Soyla Lunger, MD PCP - Electrophysiology Cardiology  05/22/23   Lonni Slain, MD Consulting Physician Cardiology  07/20/21   Loretha Ash, MD Consulting Physician Hematology and Oncology  07/20/21   Kelsie Agent, MD (Inactive) Consulting Physician Cardiology  07/20/21   Joshua Sieving, MD Consulting Physician Dermatology  07/20/21     Chief Complaint  Patient presents with   Benign Prostatic Hypertrophy    Chronic Conditions/illness Management.  Pt is fasting.      Subjective: [name] Hall is a 73 y.o. male present for follow-up on BPH. All past medical history, surgical history, allergies, family history, immunizations, medications and social history were updated in the electronic medical record today. All recent labs, ED visits and hospitalizations within the last year were reviewed.  BPH: Reports symptoms have been well contolled with the use of  Cialis  5 mg daily.  He receives printed prescriptions for his Cialis  so that he can shop for cheapest price.  He states this medicine works very well for him.  He also feels it helps with his memory.    He is currently diagnosed as NYHA class I through his cardiology team.  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  Followed closely by cardiology. Denies any negative side effects to medication Patient denies chest pain, shortness of breath, dizziness or lower extremity edema.        12/14/2023    9:10 AM 05/16/2023   10:11 AM 01/11/2022    9:38 AM 11/17/2021    3:04 PM 07/20/2021    1:32 PM  Depression screen PHQ 2/9  Decreased Interest 0 0 1 0 0  Down, Depressed, Hopeless 0 0 0 0 0  PHQ - 2 Score 0 0 1 0 0   Altered sleeping 1      Tired, decreased energy 1      Change in appetite 0      Feeling bad or failure about yourself  0      Trouble concentrating 0      Moving slowly or fidgety/restless 0      Suicidal thoughts 0      PHQ-9 Score 2      Difficult doing work/chores Not difficult at all          12/14/2023    9:10 AM  GAD 7 : Generalized Anxiety Score  Nervous, Anxious, on Edge 0  Control/stop worrying 0  Worry too much - different things 0  Trouble relaxing 0  Restless 0  Easily annoyed or irritable 0  Afraid - awful might happen 0  Total GAD 7 Score 0  Anxiety Difficulty Not difficult at all            12/14/2023    9:10 AM 05/16/2023   10:08 AM 06/24/2022    3:59 PM 01/11/2022    9:35 AM 11/17/2021    3:07 PM  Fall Risk   Falls in the past year? 0 0 0 0 0  Number falls in past yr:  0  0 0  Injury with Fall?  0  0 0  Risk for fall due to :  No Fall Risks  Follow up Falls evaluation completed Falls evaluation completed  Falls evaluation completed  Falls prevention discussed      Data saved with a previous flowsheet row definition    Immunization History  Administered Date(s) Administered   Fluad  Quad(high Dose 65+) 03/28/2022   Fluzone Influenza virus vaccine,trivalent (IIV3), split virus 05/02/2017, 03/10/2020   Influenza Split 01/06/2012   Influenza, High Dose Seasonal PF 05/13/2017, 02/16/2018   Influenza,inj,Quad PF,6+ Mos 04/09/2013, 02/26/2015   Influenza-Unspecified 01/18/2012, 05/06/2017, 02/04/2019, 02/18/2021   PFIZER(Purple Top)SARS-COV-2 Vaccination 05/21/2019, 06/11/2019, 02/08/2020   Pneumococcal Conjugate-13 06/08/2017   Pneumococcal Polysaccharide-23 02/16/2018   Tdap 04/17/2007, 02/14/2022    Past Medical History:  Diagnosis Date   BPH (benign prostatic hyperplasia)    CAD (coronary artery disease)    Cardiac defibrillator in place    CHF (congestive heart failure) (HCC)    EF 25%   Complication of anesthesia    slow to wake after  a surgery years ago ; but no issues with subsequent surgeries    Fibromyalgia    HLD (hyperlipidemia)    Hx of CABG    Hyperkalemia 04/16/2014   Hypertension    Ischemic cardiomyopathy    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 [facility])    Myocardial infarction (HCC)    2012     Sleep apnea    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   Thrombocytopenia (HCC)    Unilateral primary osteoarthritis, left hip 12/18/2017   Unilateral primary osteoarthritis, right hip 12/18/2017   Vegan diet    Allergies  Allergen Reactions   Crestor [Rosuvastatin]     myalgias   Lipitor [Atorvastatin]     myalgias   Morphine And Codeine     Nausea   Other Other (See Comments)    VEGAN ( no dairy , no meat, ,no eggs);  Fish 4-5 times a year   Statins     Muscle pain   Past Surgical History:  Procedure Laterality Date   APPENDECTOMY     CARDIAC CATHETERIZATION  2012   X2 with 2 stents    COLONOSCOPY WITH PROPOFOL  N/A 07/18/2022   Procedure: COLONOSCOPY WITH PROPOFOL ;  Surgeon: Charlanne Groom, MD;  Location: [facility];  Service: Gastroenterology;  Laterality: N/A;   CORONARY ARTERY BYPASS GRAFT  2007   quadruple    ICD IMPLANT  2012   JOINT REPLACEMENT  2019   Left Hip   partial discectomy   1995   POLYPECTOMY  07/18/2022   Procedure: POLYPECTOMY;  Surgeon: Charlanne Groom, MD;  Location: [facility];  Service: Gastroenterology;;   SPINAL FUSION  2003   lumbar    TOTAL HIP ARTHROPLASTY Left 02/15/2018   Procedure: LEFT TOTAL HIP ARTHROPLASTY ANTERIOR APPROACH;  Surgeon: Vernetta Lonni GRADE, MD;  Location: [facility];  Service: Orthopedics;  Laterality: Left;   Family History  Problem Relation Age of Onset   Breast cancer Mother    Depression Mother    Alcohol abuse Mother    Hypertension Father    Prostate cancer Father    Heart disease Father    Thyroid  disease Sister    Social History   Social History Narrative   Marital status/children/pets: Married.    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   Safety:      -Wears a bicycle helmet riding a bike: Yes     -smoke alarm in the home:Yes     - wears seatbelt: Yes          Allergies  as of 12/14/2023       Reactions   Crestor [rosuvastatin]    myalgias   Lipitor [atorvastatin]    myalgias   Morphine And Codeine    Nausea   Other Other (See Comments)   VEGAN ( no dairy , no meat, ,no eggs);  Fish 4-5 times a year   Statins    Muscle pain        Medication List        Accurate as of December 14, 2023  9:33 AM. If you have any questions, ask your nurse or doctor.          aspirin  EC 81 MG tablet Take 81 mg by mouth daily.   Cholecalciferol 25 MCG (1000 UT) tablet Take 1,000-2,000 Units by mouth every other day.   lisinopril  5 MG tablet Commonly known as: ZESTRIL  TAKE 1 TABLET BY MOUTH DAILY   metoprolol  succinate 25 MG 24 hr tablet Commonly known as: Toprol  XL Take 1 tablet (25 mg total) by mouth in the morning and at bedtime.   Multi-Vitamins Tabs Take 1 tablet by mouth daily.   OVER THE COUNTER MEDICATION Take 1 tablet by mouth daily. NMN   Repatha  SureClick 140 MG/ML Soaj Generic drug: Evolocumab  INJECT 140MG  UNDER THE SKIN EVERY 14 DAYS   tadalafil  5 MG tablet Commonly known as: CIALIS  Take 1 tablet (5 mg total) by mouth daily.   VITAMIN B-12 PO Take 2,500 mcg by mouth every other day.   VITAMIN K2-VITAMIN D3 PO Take 1 tablet by mouth every other day.       All past medical history, surgical history, allergies, family history, immunizations andmedications were updated in the EMR today and reviewed under the history and medication portions of their EMR.      ROS 14 pt review of systems performed and negative (unless mentioned in an HPI)  Objective: BP 116/64   Pulse (!) 55   Temp 98.1 F (36.7 C)   Wt 182 lb 9.6 oz (82.8 kg)   SpO2 97%   BMI 26.20 kg/m  Physical Exam Vitals and nursing note reviewed.  Constitutional:       General: He is not in acute distress.    Appearance: Normal appearance. He is not ill-appearing, toxic-appearing or diaphoretic.  HENT:     Head: Normocephalic and atraumatic.  Eyes:     General: No scleral icterus.       Right eye: No discharge.        Left eye: No discharge.     Extraocular Movements: Extraocular movements intact.     Pupils: Pupils are equal, round, and reactive to light.  Cardiovascular:     Rate and Rhythm: Normal rate and regular rhythm.  Pulmonary:     Effort: Pulmonary effort is normal. No respiratory distress.     Breath sounds: Normal breath sounds. No wheezing, rhonchi or rales.  Musculoskeletal:     Right lower leg: No edema.     Left lower leg: No edema.  Skin:    General: Skin is warm.     Findings: No rash.  Neurological:     Mental Status: He is alert and oriented to person, place, and time. Mental status is at baseline.  Psychiatric:        Mood and Affect: Mood normal.        Behavior: Behavior normal.        Thought Content: Thought content normal.        Judgment: Judgment  normal.     No results found.  Assessment/plan: [name] Hall is a 73 y.o. male present for routine chronic condition follow-up Essential hypertension/hyperlipidemia/NYHA class 1 heart failure with reduced ejection fraction (HCC)-20%/statin myopathy All cardiac medications managed by cardiology.  Male hypogonadism/male erectile dysfunction/Benign prostatic hyperplasia without lower urinary tract symptoms Stable continue Cialis  5 mg daily.  Printed and handed prescription him today. NYHA class I heart failure patient.  He understands if his heart failure progresses, BP < 90/50 or he has symptoms or he is in need of nitrates, he will need to discontinue Cialis  use. Encouraged him to keep an open discussion with his cardiology team surrounding safety of this medication    Return in about 1 year (around 12/09/2024) for Routine chronic condition  follow-up.   Orders Placed This Encounter  Procedures   CBC   Comp Met (CMET)   TSH   Lipid panel   Meds ordered this encounter  Medications   DISCONTD: tadalafil  (CIALIS ) 5 MG tablet    Sig: Take 1 tablet (5 mg total) by mouth daily.    Dispense:  90 tablet    Refill:  3   tadalafil  (CIALIS ) 5 MG tablet    Sig: Take 1 tablet (5 mg total) by mouth daily.    Dispense:  90 tablet    Refill:  3   Referral Orders  No referral(s) requested today     Note is dictated utilizing voice recognition software. Although note has been proof read prior to signing, occasional typographical errors still can be missed. If any questions arise, please do not hesitate to call for verification.  Electronically signed by: Charlies Bellini, DO [facility] [place]

## 2023-12-15 ENCOUNTER — Ambulatory Visit: Payer: Self-pay | Admitting: Family Medicine

## 2023-12-15 ENCOUNTER — Other Ambulatory Visit: Payer: Self-pay | Admitting: Cardiology

## 2023-12-26 DIAGNOSIS — L570 Actinic keratosis: Secondary | ICD-10-CM | POA: Diagnosis not present

## 2023-12-26 DIAGNOSIS — Z85820 Personal history of malignant melanoma of skin: Secondary | ICD-10-CM | POA: Diagnosis not present

## 2023-12-26 DIAGNOSIS — D2372 Other benign neoplasm of skin of left lower limb, including hip: Secondary | ICD-10-CM | POA: Diagnosis not present

## 2023-12-26 DIAGNOSIS — Z85828 Personal history of other malignant neoplasm of skin: Secondary | ICD-10-CM | POA: Diagnosis not present

## 2023-12-26 DIAGNOSIS — L821 Other seborrheic keratosis: Secondary | ICD-10-CM | POA: Diagnosis not present

## 2023-12-26 DIAGNOSIS — D225 Melanocytic nevi of trunk: Secondary | ICD-10-CM | POA: Diagnosis not present

## 2024-01-08 ENCOUNTER — Other Ambulatory Visit: Payer: Self-pay | Admitting: Cardiology

## 2024-01-09 NOTE — Progress Notes (Unsigned)
 [facility] [address] [place] [address] Phone: [phone] Subjective:   [name] Hall [name] Hall, am serving as a scribe for Dr. Arthea Claudene.  I'm seeing this patient by the request  of:  Kuneff, Renee A, DO  CC: Left shoulder pain  YEP:Dlagzrupcz  11/09/2023 Multifactorial overall.  He does have some bicep tendinitis that I think is contributing but also seems to be having difficulty with the acromioclavicular joint.  Home exercises given.  Discussed the possibility of injection which patient would like to hold on at the moment.  Was found to have some calcium deposits that could be secondary to either vitamin D deficiency which patient has been supplementing recently.  Patient given exercises with athletic trainer and I think patient will do well.  Follow-up with me again 6 to 8 weeks.     Updated 01/11/2024 [name] Hall is a 73 y.o. male coming in with complaint of L shoulder pain seems to be multifactorial as well as some bicep tendinopathy with calcific deposits.  Patient was to start home exercises, compression sleeve, avoid certain activities.  Patient states that his arm is 50% better. Pain is tolerable.    Most recent blood work by primary care provider in August was returned everything back to his baseline.    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   Thrombocytopenia (HCC)     Unilateral primary osteoarthritis, left hip 12/18/2017   Unilateral primary osteoarthritis, right hip 12/18/2017   Vegan diet    Past Surgical History:  Procedure Laterality Date   APPENDECTOMY     CARDIAC CATHETERIZATION  2012   X2 with 2 stents    COLONOSCOPY WITH PROPOFOL  N/A 07/18/2022   Procedure: COLONOSCOPY WITH PROPOFOL ;  Surgeon: Charlanne Groom, MD;  Location: [facility];  Service: Gastroenterology;  Laterality: N/A;   CORONARY ARTERY BYPASS GRAFT  2007   quadruple    ICD IMPLANT  2012   JOINT REPLACEMENT  2019   Left Hip   partial discectomy   1995   POLYPECTOMY  07/18/2022   Procedure: POLYPECTOMY;  Surgeon: Charlanne Groom, MD;  Location: [facility];  Service: Gastroenterology;;   SPINAL FUSION  2003   lumbar    TOTAL HIP ARTHROPLASTY Left 02/15/2018   Procedure: LEFT TOTAL HIP ARTHROPLASTY ANTERIOR APPROACH;  Surgeon: Vernetta Lonni GRADE, MD;  Location: [facility];  Service: Orthopedics;  Laterality: Left;   Social History   Socioeconomic History   Marital status: Married    Spouse name: Not on file   Number of children: Not on file   Years of education: Not on file   Highest education level: Some college, no degree  Occupational History   Not on file  Tobacco Use   Smoking status: Never    Passive  exposure: Never   Smokeless tobacco: Never  Vaping Use   Vaping status: Never Used  Substance and Sexual Activity   Alcohol use: Not Currently    Comment: occ   Drug use: Never   Sexual activity: Yes    Partners: Female    Birth control/protection: None    Comment: married  Other Topics Concern   Not on file  Social History Narrative   Marital status/children/pets: Married.   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   Safety:      -Wears a bicycle helmet riding a bike: Yes     -smoke alarm in the home:Yes     - wears seatbelt: Yes         Social Drivers of Corporate investment banker Strain: Low Risk  (12/13/2023)   Overall  Financial Resource Strain (CARDIA)    Difficulty of Paying Living Expenses: Not very hard  Food Insecurity: No Food Insecurity (12/13/2023)   Hunger Vital Sign    Worried About Running Out of Food in the Last Year: Never true    Ran Out of Food in the Last Year: Never true  Transportation Needs: No Transportation Needs (12/13/2023)   PRAPARE - Administrator, Civil Service (Medical): No    Lack of Transportation (Non-Medical): No  Physical Activity: Insufficiently Active (12/13/2023)   Exercise Vital Sign    Days of Exercise per Week: 3 days    Minutes of Exercise per Session: 30 min  Stress: No Stress Concern Present (12/13/2023)   [employer] of Occupational Health - Occupational Stress Questionnaire    Feeling of Stress: Only a little  Social Connections: Moderately Isolated (12/13/2023)   Social Connection and Isolation Panel    Frequency of Communication with Friends and Family: Once a week    Frequency of Social Gatherings with Friends and Family: Three times a week    Attends Religious Services: Never    Active Member of Clubs or Organizations: No    Attends Engineer, structural: Not on file    Marital Status: Married   Allergies  Allergen Reactions   Crestor [Rosuvastatin]     myalgias   Lipitor [Atorvastatin]     myalgias   Morphine And Codeine     Nausea   Other Other (See Comments)    VEGAN ( no dairy , no meat, ,no eggs);  Fish 4-5 times a year   Statins     Muscle pain   Family History  Problem Relation Age of Onset   Breast cancer Mother    Depression Mother    Alcohol abuse Mother    Hypertension Father    Prostate cancer Father    Heart disease Father    Thyroid  disease Sister      Current Outpatient Medications (Cardiovascular):    lisinopril  (ZESTRIL ) 5 MG tablet, TAKE 1 TABLET BY MOUTH DAILY   metoprolol  succinate (TOPROL -XL) 25 MG 24 hr tablet, TAKE 1 TABLET BY MOUTH EVERY MORNING AND TAKE 1 TABLET BY MOUTH EVERY NIGHT AT  BEDTIME   REPATHA  SURECLICK 140 MG/ML SOAJ, INJECT 140MG  UNDER THE SKIN EVERY 14 DAYS   tadalafil  (CIALIS ) 5 MG tablet, Take 1 tablet (5 mg total) by mouth daily.   Current Outpatient Medications (Analgesics):    aspirin  EC 81 MG tablet, Take 81 mg by mouth daily.  Current Outpatient Medications (Hematological):    Cyanocobalamin (VITAMIN B-12 PO), Take 2,500 mcg by mouth every other day.  Current Outpatient Medications (  Other):    Cholecalciferol 25 MCG (1000 UT) tablet, Take 1,000-2,000 Units by mouth every other day.   Multiple Vitamin (MULTI-VITAMINS) TABS, Take 1 tablet by mouth daily.    OVER THE COUNTER MEDICATION, Take 1 tablet by mouth daily. NMN   Vitamin D-Vitamin K (VITAMIN K2-VITAMIN D3 PO), Take 1 tablet by mouth every other day.   Reviewed prior external information including notes and imaging from  primary care provider As well as notes that were available from care everywhere and other healthcare systems.  Past medical history, social, surgical and family history all reviewed in electronic medical record.  No pertanent information unless stated regarding to the chief complaint.   Review of Systems:  No headache, visual changes, nausea, vomiting, diarrhea, constipation, dizziness, abdominal pain, skin rash, fevers, chills, night sweats, weight loss, swollen lymph nodes, body aches, joint swelling, chest pain, shortness of breath, mood changes. POSITIVE muscle aches  Objective  Blood pressure 104/64, height 5' 10 (1.778 m), weight 178 lb (80.7 kg).   General: No apparent distress alert and oriented x3 mood and affect normal, dressed appropriately.  HEENT: Pupils equal, extraocular movements intact  Respiratory: Patient's speak in full sentences and does not appear short of breath  Cardiovascular: No lower extremity edema, non tender, no erythema  Left shoulder exam shows mild tightness noted.  Patient still though has improvement from previous exam.  Rotator cuff  strength appears to be intact but patient does have more difficulty as well.   Limited muscular skeletal ultrasound was performed and interpreted by CLAUDENE HUSSAR, M  Limited ultrasound shows the patient still has some mild hypoechoic changes of the bicep tendon sheath.  patient's rotator cuff appears to be intact.  Some underlying arthritic changes noted. Impression: Interval improvement   Impression and Recommendations:    The above documentation has been reviewed and is accurate and complete Nirali Magouirk M Denell Cothern, DO

## 2024-01-11 ENCOUNTER — Encounter: Payer: Self-pay | Admitting: Family Medicine

## 2024-01-11 ENCOUNTER — Other Ambulatory Visit: Payer: Self-pay

## 2024-01-11 ENCOUNTER — Ambulatory Visit (INDEPENDENT_AMBULATORY_CARE_PROVIDER_SITE_OTHER): Admitting: Family Medicine

## 2024-01-11 VITALS — BP 104/64 | Ht 70.0 in | Wt 178.0 lb

## 2024-01-11 DIAGNOSIS — G8929 Other chronic pain: Secondary | ICD-10-CM | POA: Diagnosis not present

## 2024-01-11 DIAGNOSIS — M25512 Pain in left shoulder: Secondary | ICD-10-CM | POA: Diagnosis not present

## 2024-01-11 NOTE — Assessment & Plan Note (Signed)
 Significant improvement with the conservative therapy at this time.  On ultrasound significant less hypoechoic changes which is consistent with an improving inflammation.  Discussed with patient to continue the supplementations,, encouraged him to try to do the home exercises on a regular basis.  Follow-up with me again or in 3 months if needed.

## 2024-01-11 NOTE — Patient Instructions (Signed)
Good to see you  You are doing great  See me again in 3 months!

## 2024-01-17 NOTE — Progress Notes (Signed)
Remote ICD Transmission.

## 2024-01-17 NOTE — Addendum Note (Signed)
 Addended by: Josia Cueva A on: 01/17/2024 01:54 PM   Modules accepted: Orders

## 2024-01-23 ENCOUNTER — Ambulatory Visit (INDEPENDENT_AMBULATORY_CARE_PROVIDER_SITE_OTHER): Admitting: Family Medicine

## 2024-01-23 ENCOUNTER — Encounter: Payer: Self-pay | Admitting: Family Medicine

## 2024-01-23 VITALS — BP 116/62 | HR 58 | Temp 98.2°F | Wt 178.2 lb

## 2024-01-23 DIAGNOSIS — R109 Unspecified abdominal pain: Secondary | ICD-10-CM | POA: Diagnosis not present

## 2024-01-23 DIAGNOSIS — R197 Diarrhea, unspecified: Secondary | ICD-10-CM | POA: Diagnosis not present

## 2024-01-23 LAB — C-REACTIVE PROTEIN: CRP: 2.9 mg/dL (ref 0.5–20.0)

## 2024-01-23 LAB — COMPREHENSIVE METABOLIC PANEL WITH GFR
ALT: 8 U/L (ref 0–53)
AST: 16 U/L (ref 0–37)
Albumin: 4.1 g/dL (ref 3.5–5.2)
Alkaline Phosphatase: 99 U/L (ref 39–117)
BUN: 12 mg/dL (ref 6–23)
CO2: 27 meq/L (ref 19–32)
Calcium: 9 mg/dL (ref 8.4–10.5)
Chloride: 100 meq/L (ref 96–112)
Creatinine, Ser: 0.93 mg/dL (ref 0.40–1.50)
GFR: 81.86 mL/min (ref >60.00)
Glucose, Bld: 87 mg/dL (ref 70–99)
Potassium: 4.8 meq/L (ref 3.5–5.1)
Sodium: 134 meq/L — ABNORMAL LOW (ref 135–145)
Total Bilirubin: 0.8 mg/dL (ref 0.2–1.2)
Total Protein: 6.4 g/dL (ref 6.0–8.3)

## 2024-01-23 LAB — CBC WITH DIFFERENTIAL/PLATELET
Basophils Absolute: 0 K/uL (ref 0.0–0.1)
Basophils Relative: 0.9 % (ref 0.0–3.0)
Eosinophils Absolute: 0.3 K/uL (ref 0.0–0.7)
Eosinophils Relative: 6.2 % — ABNORMAL HIGH (ref 0.0–5.0)
HCT: 40.3 % (ref 39.0–52.0)
Hemoglobin: 13.5 g/dL (ref 13.0–17.0)
Lymphocytes Relative: 14.8 % (ref 12.0–46.0)
Lymphs Abs: 0.8 K/uL (ref 0.7–4.0)
MCHC: 33.6 g/dL (ref 30.0–36.0)
MCV: 91.1 fl (ref 78.0–100.0)
Monocytes Absolute: 0.6 K/uL (ref 0.1–1.0)
Monocytes Relative: 11.7 % (ref 3.0–12.0)
Neutro Abs: 3.4 K/uL (ref 1.4–7.7)
Neutrophils Relative %: 66.4 % (ref 43.0–77.0)
Platelets: 136 K/uL — ABNORMAL LOW (ref 150.0–400.0)
RBC: 4.42 Mil/uL (ref 4.22–5.81)
RDW: 14 % (ref 11.5–15.5)
WBC: 5.1 K/uL (ref 4.0–10.5)

## 2024-01-23 LAB — TSH: TSH: 0.96 u[IU]/mL (ref 0.35–5.50)

## 2024-01-23 LAB — LIPASE: Lipase: 133 U/L — ABNORMAL HIGH (ref 11.0–59.0)

## 2024-01-23 NOTE — Progress Notes (Signed)
 [name] Hall , Dec 11, 1950, 73 y.o., male MRN: [id number] Patient Care Team    Relationship Specialty Notifications Start End  Catherine Charlies LABOR, DO PCP - General Family Medicine  07/20/21   Lonni Slain, MD PCP - Cardiology Cardiology  06/28/22   Inocencio Soyla Lunger, MD PCP - Electrophysiology Cardiology  05/22/23   Lonni Slain, MD Consulting Physician Cardiology  07/20/21   Loretha Ash, MD Consulting Physician Hematology and Oncology  07/20/21   Kelsie Agent, MD (Inactive) Consulting Physician Cardiology  07/20/21   Joshua Sieving, MD Consulting Physician Dermatology  07/20/21     Chief Complaint  Patient presents with   GI Problem    3 weeks; loose stool, abdominal discomfort. Pt has started taking Probiotic.      Subjective: [name] Hall is a 73 y.o. Pt presents for an OV with complaints of bowel issue of a little over 2-week duration.  Associated symptoms include abdominal discomfort for around the umbilicus.  He reports his symptoms come on very suddenly 1 evening.  His stomach started making a very loud churning noise.  Since that time he has had loose stools, sometimes watery, very gassy.  He denies any epigastric discomfort, nausea, vomiting or belching.  He has had an increase in frequency of bowel movements up to approximately 3 times a day.  Prior to current symptoms he would have a bowel movement 1 time a day sometimes skip a day between. He denies any recent traveling, camping, antibiotic use.  He does not recall being exposed to any under prepared meals.  He reports he may have eaten out at a restaurant around that time. Pt has tried probiotic to ease their symptoms.  He denies fevers or chills.  No blood per rectum.     12/14/2023    9:10 AM 05/16/2023   10:11 AM 01/11/2022    9:38 AM 11/17/2021    3:04 PM 07/20/2021    1:32 PM  Depression screen PHQ 2/9  Decreased Interest 0 0 1 0 0  Down, Depressed, Hopeless 0 0 0 0 0  PHQ - 2 Score  0 0 1 0 0  Altered sleeping 1      Tired, decreased energy 1      Change in appetite 0      Feeling bad or failure about yourself  0      Trouble concentrating 0      Moving slowly or fidgety/restless 0      Suicidal thoughts 0      PHQ-9 Score 2      Difficult doing work/chores Not difficult at all        Allergies  Allergen Reactions   Crestor [Rosuvastatin]     myalgias   Lipitor [Atorvastatin]     myalgias   Morphine And Codeine     Nausea   Other Other (See Comments)    VEGAN ( no dairy , no meat, ,no eggs);  Fish 4-5 times a year   Statins     Muscle pain   Social History   Social History Narrative   Marital status/children/pets: Married.   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)    Myocardial infarction (HCC)    2012     Sleep apnea    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   Thrombocytopenia    Unilateral primary osteoarthritis, left hip 12/18/2017   Unilateral primary osteoarthritis, right hip 12/18/2017   Vegan diet    Past Surgical History:  Procedure Laterality Date   APPENDECTOMY     CARDIAC CATHETERIZATION  2012   X2 with 2 stents    COLONOSCOPY WITH PROPOFOL  N/A 07/18/2022   Procedure: COLONOSCOPY WITH PROPOFOL ;  Surgeon: Charlanne Groom, MD;  Location: [facility];  Service: Gastroenterology;  Laterality:  N/A;   CORONARY ARTERY BYPASS GRAFT  2007   quadruple    ICD IMPLANT  2012   JOINT REPLACEMENT  2019   Left Hip   partial discectomy   1995   POLYPECTOMY  07/18/2022   Procedure: POLYPECTOMY;  Surgeon: Charlanne Groom, MD;  Location: [facility];  Service: Gastroenterology;;   SPINAL FUSION  2003   lumbar    TOTAL HIP ARTHROPLASTY Left 02/15/2018   Procedure: LEFT TOTAL HIP ARTHROPLASTY ANTERIOR APPROACH;  Surgeon: Vernetta Lonni GRADE, MD;  Location: [facility];  Service: Orthopedics;  Laterality: Left;   Family History  Problem Relation Age of Onset   Breast cancer Mother    Depression Mother    Alcohol abuse Mother    Hypertension Father    Prostate cancer Father    Heart disease Father    Thyroid  disease Sister    Allergies as of 01/23/2024       Reactions   Crestor [rosuvastatin]    myalgias   Lipitor [atorvastatin]    myalgias   Morphine And Codeine    Nausea   Other Other (See Comments)   VEGAN ( no dairy , no meat, ,no eggs);  Fish 4-5 times a year   Statins    Muscle pain        Medication List        Accurate as of January 23, 2024  2:17 PM. If you have any questions, ask your nurse or doctor.          aspirin  EC 81 MG tablet Take 81 mg by mouth daily.   Cholecalciferol 25 MCG (1000 UT) tablet Take 1,000-2,000 Units by mouth every other day.   lisinopril  5 MG tablet Commonly known as: ZESTRIL  TAKE 1 TABLET BY MOUTH DAILY   metoprolol  succinate 25 MG 24 hr tablet Commonly known as: TOPROL -XL TAKE 1 TABLET BY MOUTH EVERY MORNING AND TAKE 1 TABLET BY MOUTH EVERY NIGHT AT BEDTIME   Multi-Vitamins Tabs Take 1 tablet by mouth daily.   OVER THE COUNTER MEDICATION Take 1 tablet by mouth daily. NMN   Repatha  SureClick 140 MG/ML Soaj Generic drug: Evolocumab  INJECT 140MG  UNDER THE SKIN EVERY 14 DAYS   tadalafil  5 MG tablet Commonly known as: CIALIS  Take 1 tablet (5 mg total) by mouth daily.   VITAMIN B-12 PO Take 2,500 mcg by mouth every  other  day.   VITAMIN K2-VITAMIN D3 PO Take 1 tablet by mouth every other day.        All past medical history, surgical history, allergies, family history, immunizations andmedications were updated in the EMR today and reviewed under the history and medication portions of their EMR.     ROS Negative, with the exception of above mentioned in HPI   Objective:  BP 116/62   Pulse (!) 58   Temp 98.2 F (36.8 C)   Wt 178 lb 3.2 oz (80.8 kg)   SpO2 97%   BMI 25.57 kg/m  Body mass index is 25.57 kg/m. Physical Exam Vitals and nursing note reviewed. Exam conducted with a chaperone present.  Constitutional:      General: He is not in acute distress.    Appearance: Normal appearance. He is not ill-appearing, toxic-appearing or diaphoretic.  HENT:     Head: Normocephalic and atraumatic.  Eyes:     General: No scleral icterus.       Right eye: No discharge.        Left eye: No discharge.     Extraocular Movements: Extraocular movements intact.     Pupils: Pupils are equal, round, and reactive to light.  Abdominal:     General: Abdomen is flat. Bowel sounds are normal. There is no distension.     Palpations: Abdomen is soft. There is no mass.     Tenderness: There is no abdominal tenderness. There is no guarding or rebound.     Hernia: No hernia is present.  Skin:    General: Skin is warm and dry.     Coloration: Skin is not jaundiced or pale.     Findings: No rash.  Neurological:     Mental Status: He is alert and oriented to person, place, and time. Mental status is at baseline.  Psychiatric:        Mood and Affect: Mood normal.        Behavior: Behavior normal.        Thought Content: Thought content normal.        Judgment: Judgment normal.     No results found. No results found. No results found for this or any previous visit (from the past 24 hours).  Assessment/Plan: [name] Hall is a 73 y.o. male present for OV for  Abdominal discomfort  (Primary)/diarrhea We discussed the possibility of a viral cause, versus colitis/diverticulitis versus infectious or parasitic causes.  Will move forward with lab workup today and a GI path panel Encouraged him to make sure he is hydrating well including electrolyte replacement. - Comp Met (CMET) - TSH - CBC w/Diff - Lipase - C-reactive protein -GI pathogen panel   Reviewed expectations re: course of current medical issues. Discussed self-management of symptoms. Outlined signs and symptoms indicating need for more acute intervention. Patient verbalized understanding and all questions were answered. Patient received an After-Visit Summary.    Orders Placed This Encounter  Procedures   Comp Met (CMET)   TSH   CBC w/Diff   Lipase   C-reactive protein   No orders of the defined types were placed in this encounter.  Referral Orders  No referral(s) requested today     Note is dictated utilizing voice recognition software. Although note has been proof read prior to signing, occasional typographical errors still can be missed. If any questions arise, please do not hesitate to call for verification.   electronically signed by:  Charlies Bellini, DO  [facility] [place]

## 2024-01-24 ENCOUNTER — Ambulatory Visit: Payer: PPO

## 2024-01-24 ENCOUNTER — Ambulatory Visit: Payer: Self-pay | Admitting: Family Medicine

## 2024-01-24 DIAGNOSIS — I255 Ischemic cardiomyopathy: Secondary | ICD-10-CM | POA: Diagnosis not present

## 2024-01-25 ENCOUNTER — Other Ambulatory Visit (HOSPITAL_COMMUNITY)
Admission: RE | Admit: 2024-01-25 | Discharge: 2024-01-25 | Disposition: A | Discharge status: Home / Self Care | Source: Other Acute Inpatient Hospital | Attending: Family Medicine | Admitting: Family Medicine

## 2024-01-25 DIAGNOSIS — R197 Diarrhea, unspecified: Secondary | ICD-10-CM | POA: Diagnosis not present

## 2024-01-25 DIAGNOSIS — R109 Unspecified abdominal pain: Secondary | ICD-10-CM | POA: Insufficient documentation

## 2024-01-25 LAB — CUP PACEART REMOTE DEVICE CHECK
Battery Remaining Longevity: 12 mo
Battery Remaining Percentage: 14 %
Brady Statistic RV Percent Paced: 1 %
Date Time Interrogation Session: 20250924160800
HighPow Impedance: 124 Ohm
Implantable Lead Connection Status: 753985
Implantable Lead Implant Date: 20120913
Implantable Lead Location: 753860
Implantable Lead Model: 293
Implantable Lead Serial Number: 107958
Implantable Pulse Generator Implant Date: 20120913
Lead Channel Impedance Value: 485 Ohm
Lead Channel Pacing Threshold Amplitude: 0.7 V
Lead Channel Pacing Threshold Pulse Width: 0.5 ms
Lead Channel Setting Pacing Amplitude: 2 V
Lead Channel Setting Pacing Pulse Width: 0.5 ms
Lead Channel Setting Sensing Sensitivity: 0.6 mV
Pulse Gen Serial Number: 105130
Zone Setting Status: 755011

## 2024-01-26 ENCOUNTER — Ambulatory Visit: Payer: Self-pay | Admitting: Cardiology

## 2024-01-26 ENCOUNTER — Telehealth: Payer: Self-pay

## 2024-01-26 DIAGNOSIS — R197 Diarrhea, unspecified: Secondary | ICD-10-CM

## 2024-01-26 NOTE — Telephone Encounter (Signed)
 Order resent

## 2024-01-26 NOTE — Progress Notes (Signed)
Remote ICD Transmission.

## 2024-01-26 NOTE — Telephone Encounter (Signed)
 Copied from CRM [id number]. Topic: Clinical - Request for Lab/Test Order >> Jan 25, 2024 12:29 PM Alfonso ORN wrote: Reason for CRM: [facility] says they are unable to pull up order for labs and is requesting new order be sent. >> Jan 25, 2024  2:55 PM Burnard DEL wrote: [facility] called in stating that the gastro lab wasn't submitted correctly or release for them to process.  >> Jan 25, 2024  2:53 PM Burnard DEL wrote: [place] intestinal panel order was not released to [facility]. They would like for provider to go in and resend the order please.

## 2024-01-26 NOTE — Addendum Note (Signed)
 Addended by: GEORGEAN BEEN A on: 01/26/2024 02:33 PM   Modules accepted: Orders

## 2024-01-29 ENCOUNTER — Encounter: Payer: Self-pay | Admitting: Family Medicine

## 2024-01-31 LAB — GASTROINTESTINAL PANEL BY PCR, STOOL (REPLACES STOOL CULTURE)

## 2024-02-06 DIAGNOSIS — H01024 Squamous blepharitis left upper eyelid: Secondary | ICD-10-CM | POA: Diagnosis not present

## 2024-02-06 DIAGNOSIS — H01021 Squamous blepharitis right upper eyelid: Secondary | ICD-10-CM | POA: Diagnosis not present

## 2024-02-06 DIAGNOSIS — H2513 Age-related nuclear cataract, bilateral: Secondary | ICD-10-CM | POA: Diagnosis not present

## 2024-02-07 ENCOUNTER — Telehealth: Payer: Self-pay | Admitting: Gastroenterology

## 2024-02-07 NOTE — Telephone Encounter (Addendum)
 Pt stated that he has been having diarrhea and abdominal discomfort since the beginning of September. Pt stated that she went to his PCP and she ran some stool test on him along with blood work and everything was negative. Pt requesting an office visit with a provider. Pt stated that he is averaging about 5 loose stools a day.  Pt was scheduled to see Alan Coombs PA at 02/15/2024  8:40 AM. Pt made aware.  Pt verbalized understanding with all questions answered.

## 2024-02-07 NOTE — Telephone Encounter (Signed)
 Inbound call from patient stating that he is having issues with diarrhea and abdominal pain and is requesting to speak with the nurse. Please advise.

## 2024-02-14 NOTE — Progress Notes (Unsigned)
 02/15/2024 [name] Hall [id number] Jun 04, 1950  Referring provider: Catherine Charlies LABOR, DO Primary GI doctor: Dr. Charlanne  ASSESSMENT AND PLAN:  Diarrhea x Sept  mild hypotension and dizziness on occasion, no tachycardia Having 6 x day, occ nocturnal symptoms Watery/mushy stools with borbyrygmus 2024 colonoscopy for positive Cologuard multiple polyps, normal TI no recall 01/25/2024 GI pathogen panel negative, CRP negative (did not include cdiff)  No ABX in the past year Feels warm at night but no fever, chills, nausea, vomiting Has lost about 10 lbs - CT AB pelvis with contrast urgent with diarrhea, weight loss, AB discomfort - stool samples to rule out Cdiff - TSH  -fecal fat with weight loss  -TTG/IGA to evaluate for celiac disease.  -Can do trial of IBGARD daily -Add on citracel/benefiber, FODMAP, trial off lactulose and lifestyle changes discussed -Will prescribe metronidazole and can do pepto -Pending labs and response to medication, can consider endoscopic evaluation in hospital setting to rule out microscopic colitis  Hypotension Mild, no tachycardia Consider holding lisinopil ER precautions discussed  Mildly elevated lipase Lipase 133 No imaging of pancreas Schedule CT scan  Thrombocytopenia    Latest Ref Rng & Units 01/23/2024   11:46 AM 12/14/2023    9:06 AM 01/11/2022   10:01 AM  Hepatic Function  Total Protein 6.0 - 8.3 g/dL 6.4  6.5  6.6   Albumin 3.5 - 5.2 g/dL 4.1  4.1  3.9   AST 0 - 37 U/L 16  19  16    ALT 0 - 53 U/L 8  12  9    Alk Phosphatase 39 - 117 U/L 99  97  101   Total Bilirubin 0.2 - 1.2 mg/dL 0.8  0.6  0.5    Platelets 136.0  Has seen hematology with negative workup No history of liver imaging, 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 Charlanne for positive Cologuard 8 mm polyp proximal transverse colon 5 polyps were 6 mm proximal sigmoid colon, nonbleeding internal hemorrhoids normal  TI.  Showed TA and serrated adenoma.   Recall 3 years  CAD with history of ischemic cardiomyopathy 03/2021 ejection fraction 20-25%, grade 2 diastolic dysfunction RV SF moderately reduced mild MR no aortic stenosis S/p AICD  Patient Care Team: Catherine Charlies LABOR, DO as PCP - General (Family Medicine) Lonni Slain, MD as PCP - Cardiology (Cardiology) Inocencio Soyla Lunger, MD as PCP - Electrophysiology (Cardiology) Lonni Slain, MD as Consulting Physician (Cardiology) Loretha Ash, MD as Consulting Physician (Hematology and Oncology) Kelsie Agent, MD (Inactive) as Consulting Physician (Cardiology) Joshua Sieving, MD as Consulting Physician (Dermatology)  HISTORY OF PRESENT ILLNESS: 73 y.o. male with a past medical history listed below presents for evaluation of Diarrhea.   Discussed the use of AI scribe software for clinical note transcription with the patient, who gave verbal consent to proceed.  History of Present Illness   [name] Hall is a 73 year old male with cardiomyopathy who presents with chronic diarrhea and weight loss.  He has been experiencing diarrhea since the beginning of September, which started suddenly with significant stomach gurgling and discomfort. The diarrhea has persisted and worsened, characterized as watery and occurring approximately six times a day, sometimes waking him at night. He attempted to manage the symptoms with Imodium, which made his stomach uncomfortable, and Pepto Bismol, which made him more comfortable. He has also experienced weight loss of 15-20 pounds since the onset of symptoms.  He denies any  recent antibiotic use and reports no nausea or vomiting. Occasionally, he feels as though he has a low-grade fever at night, but his temperature readings are normal. He has not noticed any blood in his stool, though initially, there was some black discoloration attributed to dietary causes. He has not experienced any significant  abdominal pain but describes a general discomfort in the abdominal area.  He has a history of cardiomyopathy and has had an AICD for over ten years. He reports some shortness of breath and dizziness, which have been more pronounced over the last four weeks. He has been monitoring his blood pressure, which he notes has been lower than usual recently. He has not been able to see his cardiologist due to scheduling issues.  He follows a vegan diet and tries to maintain hydration by drinking water and electrolyte solutions regularly. He has stopped taking any over-the-counter medications for the past month. He is frustrated with the ongoing symptoms and the impact on his daily life, including his ability to engage in activities such as golf.     He  reports that he has never smoked. He has never been exposed to tobacco smoke. He has never used smokeless tobacco. He reports that he does not currently use alcohol. He reports that he does not use drugs.  RELEVANT GI HISTORY, IMAGING AND LABS: Results   LABS Lipase: Elevated Thrombocytopenia: Chronic  DIAGNOSTIC Colonoscopy: Tubular adenomatous and serrated adenomatous polyps removed (07/2022)     Wt Readings from Last 10 Encounters:  02/15/24 172 lb 12.8 oz (78.4 kg)  01/23/24 178 lb 3.2 oz (80.8 kg)  01/11/24 178 lb (80.7 kg)  12/14/23 182 lb 9.6 oz (82.8 kg)  11/09/23 184 lb 3.2 oz (83.6 kg)  07/13/23 190 lb 1.6 oz (86.2 kg)  05/22/23 186 lb (84.4 kg)  05/16/23 187 lb (84.8 kg)  01/17/23 179 lb 9.6 oz (81.5 kg)  01/04/23 180 lb 9.6 oz (81.9 kg)    CBC    Component Value Date/Time   WBC 5.1 01/23/2024 1146   RBC 4.42 01/23/2024 1146   HGB 13.5 01/23/2024 1146   HGB 14.3 07/13/2023 1314   HCT 40.3 01/23/2024 1146   PLT 136.0 (L) 01/23/2024 1146   PLT 115 (L) 07/13/2023 1314   MCV 91.1 01/23/2024 1146   MCH 30.4 07/13/2023 1314   MCHC 33.6 01/23/2024 1146   RDW 14.0 01/23/2024 1146   LYMPHSABS 0.8 01/23/2024 1146   MONOABS 0.6  01/23/2024 1146   EOSABS 0.3 01/23/2024 1146   BASOSABS 0.0 01/23/2024 1146   Recent Labs    07/13/23 1314 12/14/23 0906 01/23/24 1146  HGB 14.3 14.4 13.5    CMP     Component Value Date/Time   NA 134 (L) 01/23/2024 1146   NA 139 05/06/2020 0000   K 4.8 01/23/2024 1146   CL 100 01/23/2024 1146   CO2 27 01/23/2024 1146   GLUCOSE 87 01/23/2024 1146   BUN 12 01/23/2024 1146   BUN 11 05/06/2020 0000   CREATININE 0.93 01/23/2024 1146   CREATININE 0.95 07/02/2020 1232   CALCIUM 9.0 01/23/2024 1146   PROT 6.4 01/23/2024 1146   ALBUMIN 4.1 01/23/2024 1146   AST 16 01/23/2024 1146   AST 25 07/02/2020 1232   ALT 8 01/23/2024 1146   ALT 11 07/02/2020 1232   ALKPHOS 99 01/23/2024 1146   BILITOT 0.8 01/23/2024 1146   BILITOT 0.6 07/02/2020 1232   GFRNONAA >60 07/02/2020 1232   GFRAA >60 02/16/2018 0449  Latest Ref Rng & Units 01/23/2024   11:46 AM 12/14/2023    9:06 AM 01/11/2022   10:01 AM  Hepatic Function  Total Protein 6.0 - 8.3 g/dL 6.4  6.5  6.6   Albumin 3.5 - 5.2 g/dL 4.1  4.1  3.9   AST 0 - 37 U/L 16  19  16    ALT 0 - 53 U/L 8  12  9    Alk Phosphatase 39 - 117 U/L 99  97  101   Total Bilirubin 0.2 - 1.2 mg/dL 0.8  0.6  0.5       Current Medications:    Current Outpatient Medications (Cardiovascular):    lisinopril  (ZESTRIL ) 5 MG tablet, TAKE 1 TABLET BY MOUTH DAILY   metoprolol  succinate (TOPROL -XL) 25 MG 24 hr tablet, TAKE 1 TABLET BY MOUTH EVERY MORNING AND TAKE 1 TABLET BY MOUTH EVERY NIGHT AT BEDTIME   REPATHA  SURECLICK 140 MG/ML SOAJ, INJECT 140MG  UNDER THE SKIN EVERY 14 DAYS   tadalafil  (CIALIS ) 5 MG tablet, Take 1 tablet (5 mg total) by mouth daily. (Patient not taking: Reported on 02/15/2024)   Current Outpatient Medications (Analgesics):    aspirin  EC 81 MG tablet, Take 81 mg by mouth daily.  Current Outpatient Medications (Hematological):    Cyanocobalamin (VITAMIN B-12 PO), Take 2,500 mcg by mouth every other day.  Current Outpatient  Medications (Other):    Cholecalciferol 25 MCG (1000 UT) tablet, Take 1,000-2,000 Units by mouth every other day.   metroNIDAZOLE (FLAGYL) 250 MG tablet, Take 1 tablet (250 mg total) by mouth 3 (three) times daily for 10 days.   Multiple Vitamin (MULTI-VITAMINS) TABS, Take 1 tablet by mouth daily.    OVER THE COUNTER MEDICATION, Take 1 tablet by mouth daily. NMN   Vitamin D-Vitamin K (VITAMIN K2-VITAMIN D3 PO), Take 1 tablet by mouth every other day.  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   Thrombocytopenia    Unilateral primary osteoarthritis, left hip 12/18/2017   Unilateral primary osteoarthritis, right hip 12/18/2017   Vegan diet    Allergies:  Allergies  Allergen Reactions   Crestor [Rosuvastatin]     myalgias   Lipitor [Atorvastatin]     myalgias   Morphine And Codeine     Nausea   Other Other (See Comments)    VEGAN ( no dairy , no meat, ,no eggs);  Fish 4-5 times a year   Statins     Muscle pain     Surgical History:  He  has a past surgical history that includes Coronary artery bypass graft (2007); Spinal fusion (2003); partial discectomy  (1995); ICD IMPLANT (2012); Cardiac catheterization (2012); Total hip arthroplasty (Left, 02/15/2018); Appendectomy; Joint replacement (2019); Colonoscopy with propofol  (N/A, 07/18/2022); and polypectomy (07/18/2022). Family History:  His family  history includes  Alcohol abuse in his mother; Breast cancer in his mother; Depression in his mother; Heart disease in his father; Hypertension in his father; Prostate cancer in his father; Thyroid  disease in his sister.  REVIEW OF SYSTEMS  : All other systems reviewed and negative except where noted in the History of Present Illness.  PHYSICAL EXAM: BP (!) 100/54   Pulse 76   Ht 5' 10 (1.778 m)   Wt 172 lb 12.8 oz (78.4 kg)   BMI 24.79 kg/m  Physical Exam   GENERAL APPEARANCE: Well nourished, in no apparent distress. HEENT: No cervical lymphadenopathy, unremarkable thyroid , sclerae anicteric, conjunctiva pink. RESPIRATORY: Respiratory effort normal, breath sounds clear to auscultation bilaterally without rales, rhonchi, or wheezing. CARDIO: Regular rate and rhythm with no murmurs, rubs, or gallops, peripheral pulses intact. ABDOMEN: Abdomen soft, non-distended, active bowel sounds in all four quadrants, no tenderness to palpation, no rebound, no mass appreciated, normal on examination. RECTAL: Declines. MUSCULOSKELETAL: Full range of motion, normal gait, without edema. SKIN: Dry, intact without rashes or lesions. No jaundice. NEURO: Alert, oriented, no focal deficits. PSYCH: Cooperative, normal mood and affect.      Alan JONELLE Coombs, PA-C 9:22 AM

## 2024-02-15 ENCOUNTER — Other Ambulatory Visit

## 2024-02-15 ENCOUNTER — Encounter: Payer: Self-pay | Admitting: Physician Assistant

## 2024-02-15 ENCOUNTER — Encounter (HOSPITAL_BASED_OUTPATIENT_CLINIC_OR_DEPARTMENT_OTHER): Payer: Self-pay

## 2024-02-15 ENCOUNTER — Ambulatory Visit: Admitting: Physician Assistant

## 2024-02-15 ENCOUNTER — Ambulatory Visit (HOSPITAL_BASED_OUTPATIENT_CLINIC_OR_DEPARTMENT_OTHER)
Admission: RE | Admit: 2024-02-15 | Discharge: 2024-02-15 | Disposition: A | Discharge status: Home / Self Care | Source: Ambulatory Visit | Attending: Physician Assistant | Admitting: Physician Assistant

## 2024-02-15 VITALS — BP 100/54 | HR 76 | Ht 70.0 in | Wt 172.8 lb

## 2024-02-15 DIAGNOSIS — Z860100 Personal history of colon polyps, unspecified: Secondary | ICD-10-CM

## 2024-02-15 DIAGNOSIS — R197 Diarrhea, unspecified: Secondary | ICD-10-CM

## 2024-02-15 DIAGNOSIS — D696 Thrombocytopenia, unspecified: Secondary | ICD-10-CM

## 2024-02-15 DIAGNOSIS — R109 Unspecified abdominal pain: Secondary | ICD-10-CM | POA: Insufficient documentation

## 2024-02-15 DIAGNOSIS — I2510 Atherosclerotic heart disease of native coronary artery without angina pectoris: Secondary | ICD-10-CM

## 2024-02-15 DIAGNOSIS — R748 Abnormal levels of other serum enzymes: Secondary | ICD-10-CM

## 2024-02-15 DIAGNOSIS — R1032 Left lower quadrant pain: Secondary | ICD-10-CM | POA: Diagnosis not present

## 2024-02-15 DIAGNOSIS — I5022 Chronic systolic (congestive) heart failure: Secondary | ICD-10-CM

## 2024-02-15 DIAGNOSIS — I959 Hypotension, unspecified: Secondary | ICD-10-CM | POA: Diagnosis not present

## 2024-02-15 DIAGNOSIS — N281 Cyst of kidney, acquired: Secondary | ICD-10-CM | POA: Diagnosis not present

## 2024-02-15 LAB — COMPREHENSIVE METABOLIC PANEL WITH GFR
ALT: 6 U/L (ref 0–53)
AST: 12 U/L (ref 0–37)
Albumin: 3.9 g/dL (ref 3.5–5.2)
Alkaline Phosphatase: 90 U/L (ref 39–117)
BUN: 13 mg/dL (ref 6–23)
CO2: 25 meq/L (ref 19–32)
Calcium: 8.9 mg/dL (ref 8.4–10.5)
Chloride: 101 meq/L (ref 96–112)
Creatinine, Ser: 1.04 mg/dL (ref 0.40–1.50)
GFR: 71.55 mL/min (ref >60.00)
Glucose, Bld: 87 mg/dL (ref 70–99)
Potassium: 4.8 meq/L (ref 3.5–5.1)
Sodium: 136 meq/L (ref 135–145)
Total Bilirubin: 0.6 mg/dL (ref 0.2–1.2)
Total Protein: 7.1 g/dL (ref 6.0–8.3)

## 2024-02-15 LAB — CBC WITH DIFFERENTIAL/PLATELET
Basophils Absolute: 0 K/uL (ref 0.0–0.1)
Basophils Relative: 0.6 % (ref 0.0–3.0)
Eosinophils Absolute: 0.7 K/uL (ref 0.0–0.7)
Eosinophils Relative: 9.3 % — ABNORMAL HIGH (ref 0.0–5.0)
HCT: 37.9 % — ABNORMAL LOW (ref 39.0–52.0)
Hemoglobin: 12.6 g/dL — ABNORMAL LOW (ref 13.0–17.0)
Lymphocytes Relative: 9.6 % — ABNORMAL LOW (ref 12.0–46.0)
Lymphs Abs: 0.7 K/uL (ref 0.7–4.0)
MCHC: 33.4 g/dL (ref 30.0–36.0)
MCV: 90 fl (ref 78.0–100.0)
Monocytes Absolute: 0.8 K/uL (ref 0.1–1.0)
Monocytes Relative: 11.2 % (ref 3.0–12.0)
Neutro Abs: 5.2 K/uL (ref 1.4–7.7)
Neutrophils Relative %: 69.3 % (ref 43.0–77.0)
Platelets: 237 K/uL (ref 150.0–400.0)
RBC: 4.21 Mil/uL — ABNORMAL LOW (ref 4.22–5.81)
RDW: 13.5 % (ref 11.5–15.5)
WBC: 7.5 K/uL (ref 4.0–10.5)

## 2024-02-15 LAB — TSH: TSH: 1.15 u[IU]/mL (ref 0.35–5.50)

## 2024-02-15 MED ORDER — IOHEXOL 300 MG/ML  SOLN
100.0000 mL | Freq: Once | INTRAMUSCULAR | Status: AC | PRN
  Administered 2024-02-15: 100 mL via INTRAVENOUS

## 2024-02-15 MED ORDER — METRONIDAZOLE 250 MG PO TABS: 250.0000 mg | ORAL_TABLET | Freq: Three times a day (TID) | ORAL | 0 refills | Status: AC

## 2024-02-15 NOTE — Patient Instructions (Signed)
 Your provider has requested that you go to the basement level for lab work before leaving today. Press B on the elevator. The lab is located at the first door on the left as you exit the elevator.  You have been scheduled for a STAT CT scan at    [facility]   02/15/2024 at 2:15 pm    If this date and time does not work for you please contact the radiology scheduling number provided to reschedule [phone]   VISIT SUMMARY:  During your visit, we discussed your chronic diarrhea, weight loss, and other related symptoms. We have planned several tests and treatments to help identify the cause and manage your symptoms.  YOUR PLAN:  CHRONIC DIARRHEA WITH ACUTE WORSENING AND UNINTENTIONAL WEIGHT LOSS: You have been experiencing worsening diarrhea since September, along with significant weight loss. We need to investigate possible causes such as infections or other conditions affecting your intestines. -Order a CT scan of your abdomen to check your pancreas and liver. -Test for Clostridium difficile infection. -Prescribe metronidazole (Flagyl) to be taken three times a day with food for ten days. -You can take Pepto Bismol as needed for comfort. -Check your stool for fat to see if you have malabsorption. -Continue to stay hydrated and drink electrolyte solutions.  ELEVATED LIPASE: Your elevated lipase levels suggest there might be an issue with your pancreas, which could be related to your diarrhea and weight loss. -Order a CT scan of your abdomen to check your pancreas and liver.  CHRONIC THROMBOCYTOPENIA: You have a low platelet count that is being monitored by a hematologist. We need to investigate if this is related to your current symptoms. -Order a CT scan of your abdomen to check for potential causes of your low platelet count.  You may have POST INFECTIOUS IBS OR IRRITABLE BOWEL After an infection or diverticulitis flare your intestines can spasm or be a little bit  more sensitive. Try these things below:  Can do BRAT diet versus low FODMAP- see below Try trial off milk/lactose products.  Add fiber like benefiber or citracel once a day Can do trial of IBGard for AB pain EVERY DAY- Take 1-2 capsules once a day for maintence or twice a day during a flare   FODMAP stands for fermentable oligo-, di-, mono-saccharides and polyols (1). These are the scientific terms used to classify groups of carbs that are notorious for triggering digestive symptoms like bloating, gas and stomach pain.       What Is Microscopic Colitis? Microscopic colitis is a condition that causes chronic, watery diarrhea. Unlike other types of colitis (like ulcerative colitis or Crohn's disease), the colon (large intestine) appears normal during a colonoscopy. The inflammation can only be seen under a microscope--hence the name.  There are two main types:  Lymphocytic colitis - increased white blood cells (lymphocytes) in the colon lining Collagenous colitis - thickened layer of collagen (a protein) in the colon lining  Common Symptoms  Ongoing watery diarrhea (often multiple times a day) Urgency to have a bowel movement Abdominal pain or cramping Bloating or gas Fatigue Weight loss (less common)  Causes and Risk Factors The exact cause isn't fully known, but possible contributing factors include:  Immune system reactions  Medications, such as: NSAIDs (e.g., ibuprofen) Proton pump inhibitors (e.g., omeprazole) SSRIs (e.g., sertraline) Smoking Older age (most common in people over 58) Male sex (more common in women)   How Is It Diagnosed?  Colonoscopy: usually appears normal  Biopsy (  tiny tissue sample from the colon) is needed to see inflammation under a microscope  Treatment Options Treatment depends on how severe your symptoms are:  Lifestyle & Diet Changes  Avoid trigger foods (e.g., caffeine, dairy, fatty foods) Quit smoking Reduce alcohol Stay  hydrated  Medications  Anti-diarrheal meds (like loperamide/Imodium) Budesonide (a corticosteroid with fewer side effects, often the first choice for moderate-to-severe cases) Bismuth subsalicylate (e.g., Pepto-Bismol) Stopping or switching medications that may be triggering the condition   What's the Outlook?  Many people improve with treatment. The condition may come and go. It's not associated with colon cancer. Regular follow-up helps keep symptoms under control.  - Can try loperamide 4 mg initially, then 2 mg after each unformed stool for =2 days, with a maximum of 16 mg/day.  - If loperamide is not working, you could try bismuth salicylate (Pepto-Bismol) 30 mL or two tablets every 30 minutes for eight doses. Pepto-Bismol may make your stools black.   Go to the ER if any severe abdominal pain, fever, or weakness  Due to recent changes in healthcare laws, you may see the results of your imaging and laboratory studies on MyChart before your provider has had a chance to review them.  We understand that in some cases there may be results that are confusing or concerning to you. Not all laboratory results come back in the same time frame and the provider may be waiting for multiple results in order to interpret others.  Please give us  48 hours in order for your provider to thoroughly review all the results before contacting the office for clarification of your results.    I appreciate the  opportunity to care for you  Thank You   [facility]

## 2024-02-16 ENCOUNTER — Other Ambulatory Visit: Payer: Self-pay

## 2024-02-16 ENCOUNTER — Other Ambulatory Visit

## 2024-02-16 ENCOUNTER — Telehealth: Payer: Self-pay | Admitting: Physician Assistant

## 2024-02-16 ENCOUNTER — Ambulatory Visit: Payer: Self-pay | Admitting: Physician Assistant

## 2024-02-16 DIAGNOSIS — D649 Anemia, unspecified: Secondary | ICD-10-CM

## 2024-02-16 DIAGNOSIS — R634 Abnormal weight loss: Secondary | ICD-10-CM

## 2024-02-16 DIAGNOSIS — R197 Diarrhea, unspecified: Secondary | ICD-10-CM

## 2024-02-16 LAB — TISSUE TRANSGLUTAMINASE, IGA: (tTG) Ab, IgA: <1 U/mL

## 2024-02-16 LAB — C. DIFFICILE GDH AND TOXIN A/B
GDH ANTIGEN: NOT DETECTED
MICRO NUMBER:: 17109983
SPECIMEN QUALITY:: ADEQUATE
TOXIN A AND B: NOT DETECTED

## 2024-02-16 LAB — IBC + FERRITIN
Ferritin: 147.7 ng/mL (ref 22.0–322.0)
Iron: 21 ug/dL — ABNORMAL LOW (ref 42–165)
Saturation Ratios: 10.1 % — ABNORMAL LOW (ref 20.0–50.0)
TIBC: 207.2 ug/dL — ABNORMAL LOW (ref 250.0–450.0)
Transferrin: 148 mg/dL — ABNORMAL LOW (ref 212.0–360.0)

## 2024-02-16 LAB — VITAMIN B12: Vitamin B-12: 1239 pg/mL — ABNORMAL HIGH (ref 211–911)

## 2024-02-16 LAB — IGA: Immunoglobulin A: 329 mg/dL — ABNORMAL HIGH (ref 70–320)

## 2024-02-16 NOTE — Telephone Encounter (Signed)
 Inbound call from patient stating he would like to speak to someone in regards to his results from ct. Patient also has questions about medication  Please advise  Thank you

## 2024-02-16 NOTE — Telephone Encounter (Signed)
 Returned call to patient & he asked if Alan was starting colestipol. Advised him not at this time, she previously sent in flagyl and recommended pepto, while we wait for further test results to come back. Pt had no further questions.

## 2024-02-17 LAB — FECAL FAT, QUALITATIVE
Fat Qual Neutral, Stl: NORMAL
Fat Qual Total, Stl: NORMAL

## 2024-02-19 ENCOUNTER — Ambulatory Visit: Payer: Self-pay | Admitting: Physician Assistant

## 2024-02-21 ENCOUNTER — Other Ambulatory Visit

## 2024-02-21 ENCOUNTER — Other Ambulatory Visit (INDEPENDENT_AMBULATORY_CARE_PROVIDER_SITE_OTHER)

## 2024-02-21 ENCOUNTER — Ambulatory Visit: Payer: Self-pay | Admitting: Physician Assistant

## 2024-02-21 DIAGNOSIS — R634 Abnormal weight loss: Secondary | ICD-10-CM | POA: Diagnosis not present

## 2024-02-21 DIAGNOSIS — R197 Diarrhea, unspecified: Secondary | ICD-10-CM

## 2024-02-21 DIAGNOSIS — D649 Anemia, unspecified: Secondary | ICD-10-CM | POA: Diagnosis not present

## 2024-02-21 DIAGNOSIS — I5022 Chronic systolic (congestive) heart failure: Secondary | ICD-10-CM

## 2024-02-21 LAB — C-REACTIVE PROTEIN: CRP: 3.9 mg/dL (ref 0.5–20.0)

## 2024-02-21 LAB — SEDIMENTATION RATE: Sed Rate: 21 mm/h — ABNORMAL HIGH (ref 0–20)

## 2024-02-22 ENCOUNTER — Other Ambulatory Visit: Payer: Self-pay

## 2024-02-22 ENCOUNTER — Telehealth: Payer: Self-pay

## 2024-02-22 DIAGNOSIS — R935 Abnormal findings on diagnostic imaging of other abdominal regions, including retroperitoneum: Secondary | ICD-10-CM

## 2024-02-22 DIAGNOSIS — R109 Unspecified abdominal pain: Secondary | ICD-10-CM

## 2024-02-22 DIAGNOSIS — R197 Diarrhea, unspecified: Secondary | ICD-10-CM

## 2024-02-22 MED ORDER — NA SULFATE-K SULFATE-MG SULF 17.5-3.13-1.6 GM/177ML PO SOLN: 1.0000 | Freq: Once | ORAL | 0 refills | Status: AC

## 2024-02-22 NOTE — Addendum Note (Signed)
 Addended by: Hurshell Dino N on: 02/22/2024 01:26 PM   Modules accepted: Orders

## 2024-02-22 NOTE — Telephone Encounter (Addendum)
 See 10/17 result note for original message.  Hospital colonoscopy scheduled on Monday, 03/25/24 at 10:15 am with Dr. Charlanne. Arrival time 8:45 am with a care partner.   Ambulatory referral to GI in epic.   Colonoscopy instructions mailed & sent via MyChart.  SUPREP sent to pharmacy.

## 2024-02-23 LAB — CALPROTECTIN, FECAL: Calprotectin, Fecal: 2020 ug/g — ABNORMAL HIGH (ref 0–120)

## 2024-02-23 MED ORDER — BUDESONIDE 3 MG PO CPEP: 9.0000 mg | ORAL_CAPSULE | Freq: Every day | ORAL | 0 refills | Status: DC

## 2024-02-24 NOTE — Telephone Encounter (Signed)
 Lets do trial of budesonide 9 mg p.o. daily x 8 weeks and then taper Agree to proceed with colonoscopy as scheduled RG

## 2024-02-26 ENCOUNTER — Other Ambulatory Visit

## 2024-02-26 DIAGNOSIS — R197 Diarrhea, unspecified: Secondary | ICD-10-CM

## 2024-02-28 ENCOUNTER — Ambulatory Visit: Payer: Self-pay | Admitting: Physician Assistant

## 2024-02-28 LAB — QUANTIFERON-TB GOLD PLUS
Mitogen-NIL: 5.79 [IU]/mL
NIL: 0.01 [IU]/mL
QuantiFERON-TB Gold Plus: NEGATIVE
TB1-NIL: 0 [IU]/mL
TB2-NIL: 0 [IU]/mL

## 2024-02-28 LAB — HEPATITIS B SURFACE ANTIGEN: Hepatitis B Surface Ag: NONREACTIVE

## 2024-03-07 ENCOUNTER — Telehealth (HOSPITAL_BASED_OUTPATIENT_CLINIC_OR_DEPARTMENT_OTHER): Payer: Self-pay | Admitting: *Deleted

## 2024-03-07 NOTE — Telephone Encounter (Signed)
   Name: [name] Hall  DOB: 1950/12/05  MRN: [id number]  Primary Cardiologist: Shelda Bruckner, MD  Chart reviewed as part of pre-operative protocol coverage. Because of [name] Hall's past medical history and time since last visit, he will require a follow-up in-office visit in order to better assess preoperative cardiovascular risk.  Patient has an office visit scheduled on 03/25/2024 with Dr. Bruckner. Appointment notes have been updated to reflect need for pre-op evaluation.   Pre-op covering staff:  - Please contact requesting surgeon's office via preferred method (i.e, phone, fax) to inform them of need for appointment prior to surgery.  Barnie Hila, NP  03/07/2024, 10:45 AM

## 2024-03-07 NOTE — Telephone Encounter (Signed)
   Pre-operative Risk Assessment    Patient Name: [name] Hall  DOB: [birth year] MRN: [id number]   Date of last office visit: 05/22/23 DAPHNE BARRACK, NP Date of next office visit: 03/21/24 DR. BRIDGETTE CHRISTOPHER   Request for Surgical Clearance    Procedure:  COLONOSCOPY  Date of Surgery:  Clearance 03/25/24                                Surgeon:  DR. LYNNIE BRING Surgeon's Group or Practice Name:  [facility] Phone number:  [phone] Fax number:  [phone]   Type of Clearance Requested:   - Medical    Type of Anesthesia:  MAC   Additional requests/questions:    Bonney Niels Jest   03/07/2024, 10:14 AM

## 2024-03-07 NOTE — Telephone Encounter (Signed)
 Will update all parties involved pt has appt 03/21/24 with Dr. Lonni.

## 2024-03-13 ENCOUNTER — Encounter: Payer: Self-pay | Admitting: Family Medicine

## 2024-03-13 ENCOUNTER — Ambulatory Visit

## 2024-03-13 ENCOUNTER — Ambulatory Visit: Admitting: Family Medicine

## 2024-03-13 VITALS — BP 118/72 | HR 78 | Ht 70.0 in | Wt 167.0 lb

## 2024-03-13 DIAGNOSIS — M542 Cervicalgia: Secondary | ICD-10-CM

## 2024-03-13 DIAGNOSIS — Z981 Arthrodesis status: Secondary | ICD-10-CM | POA: Diagnosis not present

## 2024-03-13 DIAGNOSIS — M5030 Other cervical disc degeneration, unspecified cervical region: Secondary | ICD-10-CM | POA: Insufficient documentation

## 2024-03-13 DIAGNOSIS — M47812 Spondylosis without myelopathy or radiculopathy, cervical region: Secondary | ICD-10-CM | POA: Diagnosis not present

## 2024-03-13 MED ORDER — METHYLPREDNISOLONE ACETATE 80 MG/ML IJ SUSP
80.0000 mg | Freq: Once | INTRAMUSCULAR | Status: AC
  Administered 2024-03-13: 80 mg via INTRAMUSCULAR

## 2024-03-13 MED ORDER — KETOROLAC TROMETHAMINE 60 MG/2ML IM SOLN
60.0000 mg | Freq: Once | INTRAMUSCULAR | Status: AC
  Administered 2024-03-13: 60 mg via INTRAMUSCULAR

## 2024-03-13 NOTE — Assessment & Plan Note (Signed)
 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  Will get further evaluation if we need to including a CT myelogram.  Patient does have a pacemaker. Discussed with patient about Toradol and Depo-Medrol .  Discussed with patient that this has been quite significant change.  Being worked up for inflammatory bowel disease.  Discussed different labs such as an HLA-B27 that would be potentially helpful as well.  Likely will get follow-up labs with patient having the anemia from gastroenterology so we will send a note for them to further evaluate as well.  Follow-up with me again in 2 to 3 months.  May need help with further evaluation and treatment with rheumatology depending on findings

## 2024-03-13 NOTE — Patient Instructions (Addendum)
 Injections in backside I'll look into past surgery Recommend going through colonoscopy Keep up with diet changes ROM next week See me in 2 months with update on Monday

## 2024-03-13 NOTE — Progress Notes (Signed)
 [name] [address] [place] [address] Phone: [phone] Subjective:   [name] Hall [name] Hall, am serving as a scribe for Dr. Arthea Claudene.  I'm seeing this patient by the request  of:  Kuneff, Renee A, DO  CC: Neck pain and stiffness which is new and follow-up on left shoulder pain.  YEP:Dlagzrupcz  [name] Hall is a 73 y.o. male coming in with complaint of cervical spine pain. Shoulder pain has improved. Was put in budesonide for GI issue and he feels that this medication caused neck pain on L side and sometimes on R side. Patient unable to lift head at times due to pain. Patient decided to decrease dosage of medication and this has been somewhat helpful. Also using Tylenol  and this seems to be helpful. Pain worsens at night. Unable to sleep.    Previous laboratory workup did show the patient did have significant iron deficiency but ferritin was 147.  Was found to have an elevation in sedimentation rate as well.  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   Thrombocytopenia    Unilateral primary osteoarthritis, left hip 12/18/2017   Unilateral primary osteoarthritis, right hip 12/18/2017   Vegan diet    Past Surgical History:  Procedure Laterality Date   APPENDECTOMY     CARDIAC  CATHETERIZATION  2012   X2 with 2 stents    COLONOSCOPY WITH PROPOFOL  N/A 07/18/2022   Procedure: COLONOSCOPY WITH PROPOFOL ;  Surgeon: Charlanne Groom, MD;  Location: [facility];  Service: Gastroenterology;  Laterality: N/A;   CORONARY ARTERY BYPASS GRAFT  2007   quadruple    ICD IMPLANT  2012   JOINT REPLACEMENT  2019   Left Hip   partial discectomy   1995   POLYPECTOMY  07/18/2022   Procedure: POLYPECTOMY;  Surgeon: Charlanne Groom, MD;  Location: [facility];  Service: Gastroenterology;;   SPINAL FUSION  2003   lumbar    TOTAL HIP ARTHROPLASTY Left 02/15/2018   Procedure: LEFT TOTAL HIP ARTHROPLASTY ANTERIOR APPROACH;  Surgeon: Vernetta Lonni GRADE, MD;  Location: [facility];  Service: Orthopedics;  Laterality: Left;   Social History   Socioeconomic History   Marital status: Married    Spouse name: Not on file   Number of children: Not on file   Years of education: Not on file   Highest education level: Some college, no degree  Occupational History   Not on file  Tobacco Use   Smoking status: Never    Passive exposure: Never   Smokeless tobacco: Never  Vaping Use   Vaping status: Never Used  Substance and Sexual Activity   Alcohol use: Not Currently    Comment: occ  Drug use: Never   Sexual activity: Yes    Partners: Female    Birth control/protection: None    Comment: married  Other Topics Concern   Not on file  Social History Narrative   Marital status/children/pets: Married.   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   Safety:      -Wears a bicycle helmet riding a bike: Yes     -smoke alarm in the home:Yes     - wears seatbelt: Yes         Social Drivers of Corporate Investment Banker Strain: Low Risk  (12/13/2023)   Overall Financial Resource Strain (CARDIA)    Difficulty of Paying Living Expenses: Not very hard  Food Insecurity: No Food Insecurity (12/13/2023)   Hunger Vital Sign    Worried About Running Out of Food in the Last Year:  Never true    Ran Out of Food in the Last Year: Never true  Transportation Needs: No Transportation Needs (12/13/2023)   PRAPARE - Administrator, Civil Service (Medical): No    Lack of Transportation (Non-Medical): No  Physical Activity: Insufficiently Active (12/13/2023)   Exercise Vital Sign    Days of Exercise per Week: 3 days    Minutes of Exercise per Session: 30 min  Stress: No Stress Concern Present (12/13/2023)   [employer] of Occupational Health - Occupational Stress Questionnaire    Feeling of Stress: Only a little  Social Connections: Moderately Isolated (12/13/2023)   Social Connection and Isolation Panel    Frequency of Communication with Friends and Family: Once a week    Frequency of Social Gatherings with Friends and Family: Three times a week    Attends Religious Services: Never    Active Member of Clubs or Organizations: No    Attends Engineer, Structural: Not on file    Marital Status: Married   Allergies  Allergen Reactions   Crestor [Rosuvastatin]     myalgias   Lipitor [Atorvastatin]     myalgias   Morphine And Codeine     Nausea   Other Other (See Comments)    VEGAN ( no dairy , no meat, ,no eggs);  Fish 4-5 times a year   Statins     Muscle pain   Family History  Problem Relation Age of Onset   Breast cancer Mother    Depression Mother    Alcohol abuse Mother    Hypertension Father    Prostate cancer Father    Heart disease Father    Thyroid  disease Sister     Current Outpatient Medications (Endocrine & Metabolic):    budesonide (ENTOCORT EC) 3 MG 24 hr capsule, Take 3 capsules (9 mg total) by mouth daily.  Current Outpatient Medications (Cardiovascular):    lisinopril  (ZESTRIL ) 5 MG tablet, TAKE 1 TABLET BY MOUTH DAILY   metoprolol  succinate (TOPROL -XL) 25 MG 24 hr tablet, TAKE 1 TABLET BY MOUTH EVERY MORNING AND TAKE 1 TABLET BY MOUTH EVERY NIGHT AT BEDTIME   REPATHA  SURECLICK 140 MG/ML SOAJ, INJECT 140MG  UNDER THE  SKIN EVERY 14 DAYS   tadalafil  (CIALIS ) 5 MG tablet, Take 1 tablet (5 mg total) by mouth daily.   Current Outpatient Medications (Analgesics):    aspirin  EC 81 MG tablet, Take 81 mg by mouth daily.  Current Outpatient Medications (Hematological):    Cyanocobalamin (VITAMIN B-12 PO), Take 2,500 mcg by mouth every other day.  Current Outpatient Medications (Other):    Cholecalciferol 25 MCG (1000  UT) tablet, Take 1,000-2,000 Units by mouth every other day.   Multiple Vitamin (MULTI-VITAMINS) TABS, Take 1 tablet by mouth daily.    OVER THE COUNTER MEDICATION, Take 1 tablet by mouth daily. NMN   Vitamin D-Vitamin K (VITAMIN K2-VITAMIN D3 PO), Take 1 tablet by mouth every other day.   Reviewed prior external information including notes and imaging from  primary care provider As well as notes that were available from care everywhere and other healthcare systems.  Past medical history, social, surgical and family history all reviewed in electronic medical record.  No pertanent information unless stated regarding to the chief complaint.   Review of Systems:  No  visual changes, nausea, vomiting, diarrhea, constipation, dizziness, abdominal pain, skin rash, fevers, chills, night sweats, weight loss, swollen lymph nodes,, joint swelling, chest pain, shortness of breath, mood changes. POSITIVE muscle aches, headache, body aches  Objective  Blood pressure 118/72, pulse 78, height 5' 10 (1.778 m), weight 167 lb (75.8 kg), SpO2 98%.   General: No apparent distress alert and oriented x3 mood and affect normal, dressed appropriately.  HEENT: Pupils equal, extraocular movements intact  Respiratory: Patient's speak in full sentences and does not appear short of breath  Severe loss of range of motion in the neck in any type of plane at the moment.  Patient has significant voluntary guarding.  Mild weakness noted in the C7 distribution on the left side.    Impression and Recommendations:     The  above documentation has been reviewed and is accurate and complete Albie Bazin M Arlanda Shiplett, DO

## 2024-03-15 ENCOUNTER — Other Ambulatory Visit: Payer: Self-pay

## 2024-03-15 ENCOUNTER — Ambulatory Visit: Payer: Self-pay | Admitting: Family Medicine

## 2024-03-15 DIAGNOSIS — R5383 Other fatigue: Secondary | ICD-10-CM

## 2024-03-18 ENCOUNTER — Other Ambulatory Visit (INDEPENDENT_AMBULATORY_CARE_PROVIDER_SITE_OTHER)

## 2024-03-18 ENCOUNTER — Other Ambulatory Visit

## 2024-03-18 ENCOUNTER — Telehealth: Payer: Self-pay | Admitting: Gastroenterology

## 2024-03-18 ENCOUNTER — Encounter (HOSPITAL_COMMUNITY): Payer: Self-pay | Admitting: Gastroenterology

## 2024-03-18 DIAGNOSIS — R5383 Other fatigue: Secondary | ICD-10-CM | POA: Diagnosis not present

## 2024-03-18 LAB — IBC PANEL
Iron: 51 ug/dL (ref 42–165)
Saturation Ratios: 25 % (ref 20.0–50.0)
TIBC: 204.4 ug/dL — ABNORMAL LOW (ref 250.0–450.0)
Transferrin: 146 mg/dL — ABNORMAL LOW (ref 212.0–360.0)

## 2024-03-18 LAB — CBC WITH DIFFERENTIAL/PLATELET
Basophils Absolute: 0.1 K/uL (ref 0.0–0.1)
Basophils Relative: 0.9 % (ref 0.0–3.0)
Eosinophils Absolute: 0.4 K/uL (ref 0.0–0.7)
Eosinophils Relative: 4.4 % (ref 0.0–5.0)
HCT: 33.3 % — ABNORMAL LOW (ref 39.0–52.0)
Hemoglobin: 11.1 g/dL — ABNORMAL LOW (ref 13.0–17.0)
Lymphocytes Relative: 11.9 % — ABNORMAL LOW (ref 12.0–46.0)
Lymphs Abs: 1 K/uL (ref 0.7–4.0)
MCHC: 33.4 g/dL (ref 30.0–36.0)
MCV: 91.1 fl (ref 78.0–100.0)
Monocytes Absolute: 0.7 K/uL (ref 0.1–1.0)
Monocytes Relative: 8.2 % (ref 3.0–12.0)
Neutro Abs: 6.2 K/uL (ref 1.4–7.7)
Neutrophils Relative %: 74.6 % (ref 43.0–77.0)
Platelets: 340 K/uL (ref 150.0–400.0)
RBC: 3.66 Mil/uL — ABNORMAL LOW (ref 4.22–5.81)
RDW: 14.2 % (ref 11.5–15.5)
WBC: 8.3 K/uL (ref 4.0–10.5)

## 2024-03-18 LAB — SEDIMENTATION RATE: Sed Rate: 73 mm/h — ABNORMAL HIGH (ref 0–20)

## 2024-03-18 LAB — FERRITIN: Ferritin: 148.8 ng/mL (ref 22.0–322.0)

## 2024-03-18 NOTE — Telephone Encounter (Signed)
 Left detailed vm letting patient know that I have sent updated instructions via MyChart letter.

## 2024-03-18 NOTE — Telephone Encounter (Addendum)
 Procedure:Colonoscopy Procedure date: 03/25/24 Procedure location: [facility] Arrival Time: 8:45 am Spoke with the patient Y/N:   No, I left a detailed message on [phone] on 03/18/24 @ 11:26 am for the patient to return call   Any prep concerns? Yes, patient is asking If can do the Miralax  prep instead of the prescription , he stated that he was allowed to do the Miralax  prep his last procedure.  Has the patient obtained the prep from the pharmacy ? ___ Do you have a care partner and transportation: Yes Any additional concerns? Want to change to Miralax  prep

## 2024-03-20 ENCOUNTER — Ambulatory Visit: Payer: Self-pay | Admitting: Family Medicine

## 2024-03-20 ENCOUNTER — Encounter (HOSPITAL_BASED_OUTPATIENT_CLINIC_OR_DEPARTMENT_OTHER): Payer: Self-pay

## 2024-03-20 LAB — ANTI-NUCLEAR AB-TITER (ANA TITER): ANA Titer 1: 1:40 {titer} — ABNORMAL HIGH

## 2024-03-20 LAB — ANA: Anti Nuclear Antibody (ANA): POSITIVE — AB

## 2024-03-20 LAB — HLA-B27 ANTIGEN: HLA-B27 Antigen: NEGATIVE

## 2024-03-21 ENCOUNTER — Ambulatory Visit (HOSPITAL_BASED_OUTPATIENT_CLINIC_OR_DEPARTMENT_OTHER): Admitting: Cardiology

## 2024-03-21 ENCOUNTER — Encounter (HOSPITAL_BASED_OUTPATIENT_CLINIC_OR_DEPARTMENT_OTHER): Payer: Self-pay | Admitting: Cardiology

## 2024-03-21 VITALS — BP 98/58 | HR 74 | Ht >= 80 in | Wt 164.8 lb

## 2024-03-21 DIAGNOSIS — Z789 Other specified health status: Secondary | ICD-10-CM | POA: Diagnosis not present

## 2024-03-21 DIAGNOSIS — Z95810 Presence of automatic (implantable) cardiac defibrillator: Secondary | ICD-10-CM

## 2024-03-21 DIAGNOSIS — Z951 Presence of aortocoronary bypass graft: Secondary | ICD-10-CM | POA: Diagnosis not present

## 2024-03-21 DIAGNOSIS — I255 Ischemic cardiomyopathy: Secondary | ICD-10-CM | POA: Diagnosis not present

## 2024-03-21 DIAGNOSIS — I2510 Atherosclerotic heart disease of native coronary artery without angina pectoris: Secondary | ICD-10-CM

## 2024-03-21 DIAGNOSIS — Z01810 Encounter for preprocedural cardiovascular examination: Secondary | ICD-10-CM | POA: Diagnosis not present

## 2024-03-21 DIAGNOSIS — E7800 Pure hypercholesterolemia, unspecified: Secondary | ICD-10-CM

## 2024-03-21 NOTE — Progress Notes (Signed)
 Cardiology Office Note:  .   Date:  03/21/2024  ID:  [name] Hall, DOB May 29, 1950, MRN [id number] PCP: Catherine Charlies LABOR, DO  [facility] Providers Cardiologist:  Shelda Bruckner, MD Electrophysiologist:  Will Gladis Norton, MD {  History of Present Illness: .   [name] Hall is a 73 y.o. male  with a hx of CAD, CHF, fibromyalgia, HLD with statin intolerance, sleep apnea, Myocardial Infarction (2012) s/p CABG x4, ischemic cardiomyopathy who is seen for follow-up. He was initially seen 12/29/20 as a new consult at the request of Kuneff, Renee A, DO for the evaluation and management of Ischemic cardiomyopathy.   CV history/risk factors: Exercise level: Walks a golf course/mows the lawn without exertional symptoms Current diet: Vegan diet for >10 years. 2019: LV thrombus on echo, placed on apixaban . Did not have immediate repeat study. We repeated in 09/2020, no thrombus, apixaban  stopped. Repeat echo without thrombus.  Today: He is here for follow up and preoperative evaluation for colonoscopy. See below. This is a low risk procedure but formal preoperative stratification done at request of provider office.   Has been having GI issues for about the last 2.5 mos. Has now been having significant diarrhea. Was trying to increase his protein and was eating a lot of wheat gluten. He eliminated this about 2 weeks ago, maybe some slight improvement. Has lost 20 lbs in 2.5 mos despite trying to increase his intake. Has seen some blood which he attributed to hemorrhoids. Was vegan but has added back meat to get more protein in his diet. Tried on antibiotic, budesonide.  Stopped his repatha  a few months ago to see if it changed how he was feeling. He was generally feeling fatigued. No specific changes with stopping this. LDL off of this was 104, not at goal.   Stopped tadalafil  as his blood pressure is running lower. He typically does run low on his BP but his asymptomatic with this.  He feels fine at his blood pressure today.  Has not been exercising because he has been feeling poorly. He is able to do yardwork. Has been limited more by neck pain and diarrhea, no cardiac symptoms causing limitations.  ROS: Denies chest pain, shortness of breath at rest or with normal exertion. No PND, orthopnea, LE edema or unexpected weight gain. No syncope or palpitations. ROS otherwise negative except as noted.   Studies Reviewed: SABRA    EKG:  EKG Interpretation Date/Time:  Thursday March 21 2024 08:54:53 EST Ventricular Rate:  75 PR Interval:  176 QRS Duration:  120 QT Interval:  388 QTC Calculation: 433 R Axis:   86  Text Interpretation: Sinus rhythm with occasional Premature ventricular complexes Low voltage QRS Possible Inferior infarct (cited on or before 28-Jul-2005) Cannot rule out Anterior infarct (cited on or before 07-Jul-2010) Confirmed by Bruckner Shelda [phone]) on 03/21/2024 9:04:12 AM    Physical Exam:   VS:  BP (!) 98/58   Pulse 74   Ht 7' (2.134 m)   Wt 164 lb 12.8 oz (74.8 kg)   SpO2 98%   BMI 16.42 kg/m    Wt Readings from Last 3 Encounters:  03/21/24 164 lb 12.8 oz (74.8 kg)  03/13/24 167 lb (75.8 kg)  02/15/24 172 lb 12.8 oz (78.4 kg)    GEN: Well nourished, well developed in no acute distress HEENT: Normal, moist mucous membranes NECK: No JVD CARDIAC: regular rhythm, normal S1 and S2, no rubs or gallops. No murmur. VASCULAR: Radial and DP  pulses 2+ bilaterally. No carotid bruits RESPIRATORY:  Clear to auscultation without rales, wheezing or rhonchi  ABDOMEN: Soft, non-tender, non-distended MUSCULOSKELETAL:  Ambulates independently SKIN: Warm and dry, no edema NEUROLOGIC:  Alert and oriented x 3. No focal neuro deficits noted. PSYCHIATRIC:  Normal affect    ASSESSMENT AND PLAN: .    Preoperative cardiovascular evaluation According to the Revised Cardiac Risk Index (RCRI), his Perioperative Risk of Major Cardiac Event is (%): 6.6  His  Functional Capacity in METs is: 5.19 according to the Duke Activity Status Index (DASI).  The patient is not currently having active cardiac symptoms, and they can achieve >4 METs of activity.  According to ACC/AHA Guidelines, no further testing is needed.  Proceed with surgery at acceptable risk.  Our service is available as needed in the peri-operative period.     Recommend continuing aspirin  periprocedurally unless absolutely contraindicated.  CAD with prior CABG Hypercholesterolemia, LDL goal <70, with statin intolerance Ischemic cardiomyopathy -continue aspirin  81 mg daily -has not tolerated multiple statins. Was tolerating PCSK9i, held for a few months when he has been feeling poorly. Once GI workup complete, if no contraindication would restart. -NYHA class II (though largely non-cardiac limitations) -on metoprolol  succinate 25 mg BID. Has ICD in place.  -on lisinopril  5 mg daily, low BP at baseline (currently asymptomatic). Did not tolerate spironolactone in the past due to hyperkalemia. No blood pressure room for entresto -had brain fog on farxiga, declines additional trial or trial of Jardiance -last EF 20-25% with akinetic apex and general hypokinesis otherwise in 03/2021, dilated LV, G2DD.  -discussed when to recheck echo. Would work to increase nutrition, activity once GI issues improved. If unable to get back to prior activity level, would check echo.   History of LV thrombus 2019 -rechecked echo, no thrombus. Stopped anticoagulation, no thrombus on repeat echo  CV risk counseling and prevention -recommend heart healthy/Mediterranean diet, with whole grains, fruits, vegetable, fish, lean meats, nuts, and olive oil. Limit salt. -recommend moderate walking, 3-5 times/week for 30-50 minutes each session. Aim for at least 150 minutes/week. Goal should be pace of 3 miles/hours, or walking 1.5 miles in 30 minutes -recommend avoidance of tobacco products. Avoid excess  alcohol.  Dispo: 3 mos or sooner as needed  Signed, Shelda Bruckner, MD   Shelda Bruckner, MD, PhD, [facility]  [facility]  [place]  Heart & Vascular at [facility] at [facility] [place] [address] [place], [place] [address] [phone]

## 2024-03-21 NOTE — Patient Instructions (Signed)
 Medication Instructions:  No changes *If you need a refill on your cardiac medications before your next appointment, please call your pharmacy*  Lab Work: none   Testing/Procedures: none  Follow-Up: At [facility], you and your health needs are our priority.  As part of our continuing mission to provide you with exceptional heart care, our providers are all part of one team.  This team includes your primary Cardiologist (physician) and Advanced Practice Providers or APPs (Physician Assistants and Nurse Practitioners) who all work together to provide you with the care you need, when you need it.  Your next appointment:   3 month(s)  Provider:   Shelda Bruckner, MD, Rosaline Bane, NP, or Reche Finder, NP

## 2024-03-25 ENCOUNTER — Encounter (HOSPITAL_COMMUNITY): Payer: Self-pay | Admitting: Gastroenterology

## 2024-03-25 ENCOUNTER — Telehealth: Payer: Self-pay

## 2024-03-25 ENCOUNTER — Encounter (HOSPITAL_COMMUNITY)
Admission: RE | Disposition: A | Discharge status: Home / Self Care | Payer: Self-pay | Source: Home / Self Care | Attending: Gastroenterology

## 2024-03-25 ENCOUNTER — Ambulatory Visit (HOSPITAL_COMMUNITY)
Admission: RE | Admit: 2024-03-25 | Discharge: 2024-03-25 | Disposition: A | Discharge status: Home / Self Care | Attending: Gastroenterology | Admitting: Gastroenterology

## 2024-03-25 ENCOUNTER — Ambulatory Visit (HOSPITAL_COMMUNITY)

## 2024-03-25 ENCOUNTER — Other Ambulatory Visit: Payer: Self-pay

## 2024-03-25 DIAGNOSIS — I110 Hypertensive heart disease with heart failure: Secondary | ICD-10-CM | POA: Diagnosis not present

## 2024-03-25 DIAGNOSIS — I2510 Atherosclerotic heart disease of native coronary artery without angina pectoris: Secondary | ICD-10-CM | POA: Diagnosis not present

## 2024-03-25 DIAGNOSIS — K529 Noninfective gastroenteritis and colitis, unspecified: Secondary | ICD-10-CM

## 2024-03-25 DIAGNOSIS — R197 Diarrhea, unspecified: Secondary | ICD-10-CM

## 2024-03-25 DIAGNOSIS — K6289 Other specified diseases of anus and rectum: Secondary | ICD-10-CM | POA: Diagnosis not present

## 2024-03-25 DIAGNOSIS — Z860101 Personal history of adenomatous and serrated colon polyps: Secondary | ICD-10-CM | POA: Insufficient documentation

## 2024-03-25 DIAGNOSIS — G4730 Sleep apnea, unspecified: Secondary | ICD-10-CM | POA: Insufficient documentation

## 2024-03-25 DIAGNOSIS — I255 Ischemic cardiomyopathy: Secondary | ICD-10-CM | POA: Diagnosis not present

## 2024-03-25 DIAGNOSIS — Z95810 Presence of automatic (implantable) cardiac defibrillator: Secondary | ICD-10-CM | POA: Insufficient documentation

## 2024-03-25 DIAGNOSIS — I509 Heart failure, unspecified: Secondary | ICD-10-CM | POA: Insufficient documentation

## 2024-03-25 DIAGNOSIS — K640 First degree hemorrhoids: Secondary | ICD-10-CM | POA: Diagnosis not present

## 2024-03-25 DIAGNOSIS — I252 Old myocardial infarction: Secondary | ICD-10-CM | POA: Insufficient documentation

## 2024-03-25 DIAGNOSIS — Z951 Presence of aortocoronary bypass graft: Secondary | ICD-10-CM | POA: Diagnosis not present

## 2024-03-25 DIAGNOSIS — I5022 Chronic systolic (congestive) heart failure: Secondary | ICD-10-CM | POA: Diagnosis not present

## 2024-03-25 DIAGNOSIS — R634 Abnormal weight loss: Secondary | ICD-10-CM

## 2024-03-25 SURGERY — COLONOSCOPY
Anesthesia: Monitor Anesthesia Care

## 2024-03-25 MED ORDER — PROPOFOL 10 MG/ML IV BOLUS
INTRAVENOUS | Status: DC | PRN
  Administered 2024-03-25: 20 mg via INTRAVENOUS

## 2024-03-25 MED ORDER — ALBUMIN HUMAN 5 % IV SOLN
INTRAVENOUS | Status: AC | PRN
  Administered 2024-03-25: 12.5 g via INTRAVENOUS

## 2024-03-25 MED ORDER — LIDOCAINE 2% (20 MG/ML) 5 ML SYRINGE
INTRAMUSCULAR | Status: DC | PRN
  Administered 2024-03-25: 40 mg via INTRAVENOUS

## 2024-03-25 MED ORDER — PROPOFOL 500 MG/50ML IV EMUL
INTRAVENOUS | Status: DC | PRN
  Administered 2024-03-25: 180 ug/kg/min via INTRAVENOUS

## 2024-03-25 MED ORDER — SODIUM CHLORIDE 0.9 % IV SOLN: INTRAVENOUS | Status: DC | PRN

## 2024-03-25 MED ORDER — PHENYLEPHRINE 80 MCG/ML (10ML) SYRINGE FOR IV PUSH (FOR BLOOD PRESSURE SUPPORT)
PREFILLED_SYRINGE | INTRAVENOUS | Status: DC | PRN
  Administered 2024-03-25 (×3): 80 ug via INTRAVENOUS

## 2024-03-25 MED ORDER — PREDNISONE 10 MG PO TABS: ORAL_TABLET | ORAL | 0 refills | Status: AC

## 2024-03-25 NOTE — Discharge Instructions (Signed)

## 2024-03-25 NOTE — H&P (Signed)
 Signed     Expand All Collapse All         02/15/2024 [name] Hall [id number] 12-07-1950   Referring provider: Catherine Charlies LABOR, DO Primary GI doctor: Dr. Charlanne   ASSESSMENT AND PLAN:  Diarrhea x Sept  mild hypotension and dizziness on occasion, no tachycardia Having 6 x day, occ nocturnal symptoms Watery/mushy stools with borbyrygmus 2024 colonoscopy for positive Cologuard multiple polyps, normal TI no recall 01/25/2024 GI pathogen panel negative, CRP negative (did not include cdiff)  No ABX in the past year Feels warm at night but no fever, chills, nausea, vomiting Has lost about 10 lbs - CT AB pelvis with contrast urgent with diarrhea, weight loss, AB discomfort - stool samples to rule out Cdiff - TSH  -fecal fat with weight loss  -TTG/IGA to evaluate for celiac disease.  -Can do trial of IBGARD daily -Add on citracel/benefiber, FODMAP, trial off lactulose and lifestyle changes discussed -Will prescribe metronidazole  and can do pepto -Pending labs and response to medication, can consider endoscopic evaluation in hospital setting to rule out microscopic colitis   Hypotension Mild, no tachycardia Consider holding lisinopil ER precautions discussed   Mildly elevated lipase Lipase 133 No imaging of pancreas Schedule CT scan   Thrombocytopenia     Latest Ref Rng & Units 01/23/2024   11:46 AM 12/14/2023    9:06 AM 01/11/2022   10:01 AM  Hepatic Function  Total Protein 6.0 - 8.3 g/dL 6.4  6.5  6.6   Albumin  3.5 - 5.2 g/dL 4.1  4.1  3.9   AST 0 - 37 U/L 16  19  16    ALT 0 - 53 U/L 8  12  9    Alk Phosphatase 39 - 117 U/L 99  97  101   Total Bilirubin 0.2 - 1.2 mg/dL 0.8  0.6  0.5    Platelets 136.0  Has seen hematology with negative workup No history of liver imaging, 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 Charlanne for positive Cologuard 8 mm polyp proximal transverse colon 5 polyps were 6 mm proximal  sigmoid colon, nonbleeding internal hemorrhoids normal TI.  Showed TA and serrated adenoma.   Recall 3 years   CAD with history of ischemic cardiomyopathy 03/2021 ejection fraction 20-25%, grade 2 diastolic dysfunction RV SF moderately reduced mild MR no aortic stenosis S/p AICD   Patient Care Team: Catherine Charlies LABOR, DO as PCP - General (Family Medicine) Lonni Slain, MD as PCP - Cardiology (Cardiology) Inocencio Soyla Lunger, MD as PCP - Electrophysiology (Cardiology) Lonni Slain, MD as Consulting Physician (Cardiology) Loretha Ash, MD as Consulting Physician (Hematology and Oncology) Kelsie Agent, MD (Inactive) as Consulting Physician (Cardiology) Joshua Sieving, MD as Consulting Physician (Dermatology)   HISTORY OF PRESENT ILLNESS: 73 y.o. male with a past medical history listed below presents for evaluation of Diarrhea.    Discussed the use of AI scribe software for clinical note transcription with the patient, who gave verbal consent to proceed.   History of Present Illness   [name] Hall is a 73 year old male with cardiomyopathy who presents with chronic diarrhea and weight loss.   He has been experiencing diarrhea since the beginning of September, which started suddenly with significant stomach gurgling and discomfort. The diarrhea has persisted and worsened, characterized as watery and occurring approximately six times a day, sometimes waking him at night. He attempted to manage the symptoms with Imodium, which made  his stomach uncomfortable, and Pepto Bismol, which made him more comfortable. He has also experienced weight loss of 15-20 pounds since the onset of symptoms.   He denies any recent antibiotic use and reports no nausea or vomiting. Occasionally, he feels as though he has a low-grade fever at night, but his temperature readings are normal. He has not noticed any blood in his stool, though initially, there was some black discoloration attributed  to dietary causes. He has not experienced any significant abdominal pain but describes a general discomfort in the abdominal area.   He has a history of cardiomyopathy and has had an AICD for over ten years. He reports some shortness of breath and dizziness, which have been more pronounced over the last four weeks. He has been monitoring his blood pressure, which he notes has been lower than usual recently. He has not been able to see his cardiologist due to scheduling issues.   He follows a vegan diet and tries to maintain hydration by drinking water and electrolyte solutions regularly. He has stopped taking any over-the-counter medications for the past month. He is frustrated with the ongoing symptoms and the impact on his daily life, including his ability to engage in activities such as golf.    He  reports that he has never smoked. He has never been exposed to tobacco smoke. He has never used smokeless tobacco. He reports that he does not currently use alcohol. He reports that he does not use drugs.   RELEVANT GI HISTORY, IMAGING AND LABS: Results   LABS Lipase: Elevated Thrombocytopenia: Chronic   DIAGNOSTIC Colonoscopy: Tubular adenomatous and serrated adenomatous polyps removed (07/2022)        Wt Readings from Last 10 Encounters:  02/15/24 172 lb 12.8 oz (78.4 kg)  01/23/24 178 lb 3.2 oz (80.8 kg)  01/11/24 178 lb (80.7 kg)  12/14/23 182 lb 9.6 oz (82.8 kg)  11/09/23 184 lb 3.2 oz (83.6 kg)  07/13/23 190 lb 1.6 oz (86.2 kg)  05/22/23 186 lb (84.4 kg)  05/16/23 187 lb (84.8 kg)  01/17/23 179 lb 9.6 oz (81.5 kg)  01/04/23 180 lb 9.6 oz (81.9 kg)      CBC Labs (Brief)          Component Value Date/Time    WBC 5.1 01/23/2024 1146    RBC 4.42 01/23/2024 1146    HGB 13.5 01/23/2024 1146    HGB 14.3 07/13/2023 1314    HCT 40.3 01/23/2024 1146    PLT 136.0 (L) 01/23/2024 1146    PLT 115 (L) 07/13/2023 1314    MCV 91.1 01/23/2024 1146    MCH 30.4 07/13/2023 1314    MCHC  33.6 01/23/2024 1146    RDW 14.0 01/23/2024 1146    LYMPHSABS 0.8 01/23/2024 1146    MONOABS 0.6 01/23/2024 1146    EOSABS 0.3 01/23/2024 1146    BASOSABS 0.0 01/23/2024 1146      Recent Labs (within last 365 days)       Recent Labs    07/13/23 1314 12/14/23 0906 01/23/24 1146  HGB 14.3 14.4 13.5        CMP     Labs (Brief)          Component Value Date/Time    NA 134 (L) 01/23/2024 1146    NA 139 05/06/2020 0000    K 4.8 01/23/2024 1146    CL 100 01/23/2024 1146    CO2 27 01/23/2024 1146    GLUCOSE 87 01/23/2024 1146  BUN 12 01/23/2024 1146    BUN 11 05/06/2020 0000    CREATININE 0.93 01/23/2024 1146    CREATININE 0.95 07/02/2020 1232    CALCIUM 9.0 01/23/2024 1146    PROT 6.4 01/23/2024 1146    ALBUMIN  4.1 01/23/2024 1146    AST 16 01/23/2024 1146    AST 25 07/02/2020 1232    ALT 8 01/23/2024 1146    ALT 11 07/02/2020 1232    ALKPHOS 99 01/23/2024 1146    BILITOT 0.8 01/23/2024 1146    BILITOT 0.6 07/02/2020 1232    GFRNONAA >60 07/02/2020 1232    GFRAA >60 02/16/2018 0449          Latest Ref Rng & Units 01/23/2024   11:46 AM 12/14/2023    9:06 AM 01/11/2022   10:01 AM  Hepatic Function  Total Protein 6.0 - 8.3 g/dL 6.4  6.5  6.6   Albumin  3.5 - 5.2 g/dL 4.1  4.1  3.9   AST 0 - 37 U/L 16  19  16    ALT 0 - 53 U/L 8  12  9    Alk Phosphatase 39 - 117 U/L 99  97  101   Total Bilirubin 0.2 - 1.2 mg/dL 0.8  0.6  0.5       Current Medications:      Current Outpatient Medications (Cardiovascular):    lisinopril  (ZESTRIL ) 5 MG tablet, TAKE 1 TABLET BY MOUTH DAILY   metoprolol  succinate (TOPROL -XL) 25 MG 24 hr tablet, TAKE 1 TABLET BY MOUTH EVERY MORNING AND TAKE 1 TABLET BY MOUTH EVERY NIGHT AT BEDTIME   REPATHA  SURECLICK 140 MG/ML SOAJ, INJECT 140MG  UNDER THE SKIN EVERY 14 DAYS   tadalafil  (CIALIS ) 5 MG tablet, Take 1 tablet (5 mg total) by mouth daily. (Patient not taking: Reported on 02/15/2024)     Current Outpatient Medications (Analgesics):     aspirin  EC 81 MG tablet, Take 81 mg by mouth daily.   Current Outpatient Medications (Hematological):    Cyanocobalamin  (VITAMIN B-12 PO), Take 2,500 mcg by mouth every other day.   Current Outpatient Medications (Other):    Cholecalciferol 25 MCG (1000 UT) tablet, Take 1,000-2,000 Units by mouth every other day.   metroNIDAZOLE  (FLAGYL ) 250 MG tablet, Take 1 tablet (250 mg total) by mouth 3 (three) times daily for 10 days.   Multiple Vitamin (MULTI-VITAMINS) TABS, Take 1 tablet by mouth daily.    OVER THE COUNTER MEDICATION, Take 1 tablet by mouth daily. NMN   Vitamin D-Vitamin K (VITAMIN K2-VITAMIN D3 PO), Take 1 tablet by mouth every other day.   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   Thrombocytopenia  Unilateral primary osteoarthritis, left hip 12/18/2017   Unilateral primary osteoarthritis, right hip 12/18/2017   Vegan diet          Allergies:  Allergies       Allergies  Allergen Reactions   Crestor [Rosuvastatin]        myalgias   Lipitor [Atorvastatin]        myalgias   Morphine And Codeine        Nausea   Other Other (See Comments)      VEGAN ( no dairy , no meat, ,no eggs);  Fish 4-5 times a year   Statins        Muscle pain        Surgical History:  He  has a past surgical history that includes Coronary artery bypass graft (2007); Spinal fusion (2003); partial  discectomy  (1995); ICD IMPLANT (2012); Cardiac catheterization (2012); Total hip arthroplasty (Left, 02/15/2018); Appendectomy; Joint replacement (2019); Colonoscopy with propofol  (N/A, 07/18/2022); and polypectomy (07/18/2022). Family History:  His family history includes Alcohol abuse in his mother; Breast cancer in his mother; Depression in his mother; Heart disease in his father; Hypertension in his father; Prostate cancer in his father; Thyroid  disease in his sister.   REVIEW OF SYSTEMS  : All other systems reviewed and negative except where noted in the History of Present Illness.   PHYSICAL EXAM: BP (!) 100/54   Pulse 76   Ht 5' 10 (1.778 m)   Wt 172 lb 12.8 oz (78.4 kg)   BMI 24.79 kg/m  Physical Exam   GENERAL APPEARANCE: Well nourished, in no apparent distress. HEENT: No cervical lymphadenopathy, unremarkable thyroid , sclerae anicteric, conjunctiva pink. RESPIRATORY: Respiratory effort normal, breath sounds clear to auscultation bilaterally without rales, rhonchi, or wheezing. CARDIO: Regular rate and rhythm with no murmurs, rubs, or gallops, peripheral pulses intact. ABDOMEN: Abdomen soft, non-distended, active bowel sounds in all four quadrants, no tenderness to palpation, no rebound, no mass appreciated, normal on examination. RECTAL: Declines. MUSCULOSKELETAL: Full range of motion, normal gait, without edema. SKIN: Dry, intact without rashes or lesions. No jaundice. NEURO: Alert, oriented, no focal deficits. PSYCH: Cooperative, normal mood and affect.       Alan JONELLE Coombs, PA-C     Attending physician's note   I have taken history, reviewed the chart and examined the patient. I performed a substantive portion of this encounter, including complete performance of at least one of the key components, in conjunction with the APP. I agree with the Advanced Practitioner's note, impression and recommendations.    Anselm Bring, MD [facility] [phone]  9:22 AM

## 2024-03-25 NOTE — Telephone Encounter (Signed)
 Spoke with patient and he will pick up the GI panel with C diff ([facility]) on the second floor and return to lab. Directions given

## 2024-03-25 NOTE — Progress Notes (Signed)
 Pt with low blood pressure. Fudan CRNA to bedside. Pt denies any symptoms and states blood pressure runs low at home.  Albumin  hung and meds given per CRNA. Dr. Treen made aware and good with plan. Continue to assess.

## 2024-03-25 NOTE — Progress Notes (Signed)
 Dr. Treen by to see patient. Blood pressure is improved and patient still feeling well. No new orders. Patient able to be discharged when ready

## 2024-03-25 NOTE — Op Note (Addendum)
 [facility] Patient Name: [name] Hall Procedure Date: 03/25/2024 MRN: [id number] Attending MD: Lynnie Bring , MD, [id number] Date of Birth: January 05, 1951 CSN: [id number] Age: 73 Admit Type: Outpatient Procedure:                Colonoscopy Indications:              Clinically significant diarrhea of unexplained                            origin with positive calprotectin, negative stool                            studies Providers:                Lynnie Bring, MD, Willy Hummer, RN, Farris Southgate,                            Technician Referring MD:             Lynnie Bring, MD Medicines:                Monitored Anesthesia Care Complications:            No immediate complications. Estimated Blood Loss:     Estimated blood loss: none. Procedure:                Pre-Anesthesia Assessment:                           - Prior to the procedure, a History and Physical                            was performed, and patient medications and                            allergies were reviewed. The patient's tolerance of                            previous anesthesia was also reviewed. The risks                            and benefits of the procedure and the sedation                            options and risks were discussed with the patient.                            All questions were answered, and informed consent                            was obtained. Prior Anticoagulants: The patient has                            taken no anticoagulant or antiplatelet agents. ASA                            Grade Assessment: IV - A  patient with severe                            systemic disease that is a constant threat to life.                            After reviewing the risks and benefits, the patient                            was deemed in satisfactory condition to undergo the                            procedure.                           After obtaining informed consent, the  colonoscope                            was passed under direct vision. Throughout the                            procedure, the patient's blood pressure, pulse, and                            oxygen saturations were monitored continuously. The                            [id number]-[id number] ([id number] Olympus colonoscope was                            introduced through the anus with the intention of                            advancing to the ileum. The scope was advanced to                            the transverse colon before the procedure was                            aborted d/t very poor prep and severe colitis.                            Medications were given. The colonoscopy was                            performed without difficulty. The patient tolerated                            the procedure well. The quality of the bowel                            preparation was poor. The ileocecal valve,  appendiceal orifice, and rectum were photographed. Scope In: 10:48:23 AM Scope Out: 10:59:28 AM Scope Withdrawal Time: 0 hours 5 minutes 0 seconds  Total Procedure Duration: 0 hours 11 minutes 5 seconds  Findings:      Diffuse severe inflammation characterized by altered vascularity,       congestion (edema), erosions, erythema, friability, linear erosions and       aphthous ulcerations was found in the rectum, in the recto-sigmoid       colon, in the sigmoid colon, in the descending colon, at the splenic       flexure and in the transverse colon. Most severe at transverse colon.       Biopsies were taken with a cold forceps for histology. Separate biopsies       were obtained from the rectum and sent for histology in a separate jar.      Non-bleeding internal hemorrhoids were found during retroflexion. The       hemorrhoids were small and Grade I (internal hemorrhoids that do not       prolapse). Impression:               - Preparation of the colon was poor.  Colonoscopy up                            to mid transverse colon stopped due to poor prep                            and severe colitis                           - Diffuse severe inflammation was found in the                            rectum, in the recto-sigmoid colon, in the sigmoid                            colon, in the descending colon, at the splenic                            flexure and in the transverse colon secondary to                            colitis. Biopsied.                           - Non-bleeding internal hemorrhoids. Moderate Sedation:      Not Applicable - Patient had care per Anesthesia. Recommendation:           - Patient has a contact number available for                            emergencies. The signs and symptoms of potential                            delayed complications were discussed with the  patient. Return to normal activities tomorrow.                            Written discharge instructions were provided to the                            patient.                           - Resume previous diet.                           - Continue present medications.                           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 Will give him a trial of steroids - Pred                            40mg  po QD x 1 week, then 30mg  po QD x 1 week, then                            20 mg x 2 weeks, then 10 mg for 2 weeks.                           - Await pathology results.                           - FU in GI clinic in 2 to 3 weeks.                           - Repeat colonoscopy for surveillance based on                             pathology results.                           - The findings and recommendations were discussed  with the patient's family. Procedure Code(s):        --- Professional ---                           [phone], 52, Colonoscopy, flexible; with biopsy,                            single or multiple Diagnosis Code(s):        --- Professional ---                           K64.0, First degree hemorrhoids                           K52.9, Noninfective gastroenteritis and colitis,                            unspecified                           R19.7, Diarrhea, unspecified CPT copyright 2022 American Medical Association. All rights reserved. The codes documented in this report are preliminary and upon coder review may  be revised to meet current compliance requirements. Lynnie Bring, MD 03/25/2024 11:10:32 AM This report has been signed electronically. Number of Addenda: 0

## 2024-03-25 NOTE — Anesthesia Postprocedure Evaluation (Signed)
 Anesthesia Post Note  Patient: [name] Hall  Procedure(s) Performed: COLONOSCOPY     Patient location during evaluation: PACU Anesthesia Type: MAC Level of consciousness: awake and alert Pain management: pain level controlled Vital Signs Assessment: post-procedure vital signs reviewed and stable Respiratory status: spontaneous breathing, nonlabored ventilation, respiratory function stable and patient connected to nasal cannula oxygen Cardiovascular status: stable and blood pressure returned to baseline Postop Assessment: no apparent nausea or vomiting Anesthetic complications: no   No notable events documented.  Last Vitals:  Vitals:   03/25/24 1140 03/25/24 1150  BP: (!) 101/51 (!) 112/57  Pulse: 64 61  Resp: 14 15  Temp:    SpO2: 100% 100%    Last Pain:  Vitals:   03/25/24 1150  TempSrc:   PainSc: 0-No pain                 Thom [name] Hall

## 2024-03-25 NOTE — Anesthesia Preprocedure Evaluation (Addendum)
 Anesthesia Evaluation  Patient identified by MRN, date of birth, ID band Patient awake    Reviewed: Allergy & Precautions, H&P , NPO status , Patient's Chart, lab work & pertinent test results  History of Anesthesia Complications Negative for: history of anesthetic complications  Airway Mallampati: II  TM Distance: >3 FB Neck ROM: Full    Dental no notable dental hx.    Pulmonary sleep apnea    Pulmonary exam normal breath sounds clear to auscultation       Cardiovascular METS: 5 - 7 Mets hypertension, + CAD, + Past MI, + CABG and +CHF  Normal cardiovascular exam+ Cardiac Defibrillator  Rhythm:Regular Rate:Normal  No recent echo, good functional status. Was golfing weekly leading up to about two months ago. Can walk up a flight of steps. No CP/SOB  Hx of LV thrombus,   TTE 2022: IMPRESSIONS     1. With Definity  contrast, the LV apex is seen to be akinetic. There is  no evidence of LV thrombus at this point but there is swirling of blood  and stagnant pooling of blood in the apex. . Left ventricular ejection  fraction, by estimation, is 20 to 25%.   The left ventricle has severely decreased function. The left ventricle  demonstrates global hypokinesis. The left ventricular internal cavity size  was moderately to severely dilated. Left ventricular diastolic parameters  are consistent with Grade II  diastolic dysfunction (pseudonormalization). Elevated left ventricular  end-diastolic pressure. There is severe akinesis of the left ventricular,  apical apical segment and anteroseptal wall.   2. Right ventricular systolic function is moderately reduced. The right  ventricular size is mildly enlarged.   3. Left atrial size was mild to moderately dilated.   4. The mitral valve is grossly normal. Mild mitral valve regurgitation.   5. The aortic valve is grossly normal. Aortic valve regurgitation is not  visualized.      Neuro/Psych neg Seizures negative neurological ROS  negative psych ROS   GI/Hepatic Neg liver ROS,,,colitis    Endo/Other  negative endocrine ROS    Renal/GU negative Renal ROS  negative genitourinary   Musculoskeletal  (+) Arthritis ,    Abdominal   Peds negative pediatric ROS (+)  Hematology negative hematology ROS (+)   Anesthesia Other Findings   Reproductive/Obstetrics negative OB ROS                              Anesthesia Physical Anesthesia Plan  ASA: 4  Anesthesia Plan: MAC   Post-op Pain Management:    Induction: Intravenous  PONV Risk Score and Plan: 1 and Propofol  infusion and Treatment may vary due to age or medical condition  Airway Management Planned: Natural Airway  Additional Equipment:   Intra-op Plan:   Post-operative Plan:   Informed Consent: I have reviewed the patients History and Physical, chart, labs and discussed the procedure including the risks, benefits and alternatives for the proposed anesthesia with the patient or authorized representative who has indicated his/her understanding and acceptance.     Dental advisory given  Plan Discussed with: CRNA  Anesthesia Plan Comments:          Anesthesia Quick Evaluation

## 2024-03-25 NOTE — Transfer of Care (Signed)
 Immediate Anesthesia Transfer of Care Note  Patient: [name] Hall  Procedure(s) Performed: COLONOSCOPY  Patient Location: Endoscopy Unit  Anesthesia Type:MAC  Level of Consciousness: sedated  Airway & Oxygen Therapy: Patient Spontanous Breathing and Patient connected to face mask oxygen  Post-op Assessment: Report given to RN and Post -op Vital signs reviewed and stable  Post vital signs: Reviewed and stable  Last Vitals:  Vitals Value Taken Time  BP 97/50 03/25/24 11:08  Temp 36.5 C 03/25/24 11:06  Pulse 57 03/25/24 11:10  Resp 21 03/25/24 11:10  SpO2 98 % 03/25/24 11:10  Vitals shown include unfiled device data.  Last Pain:  Vitals:   03/25/24 1106  TempSrc: Temporal  PainSc:          Complications: No notable events documented.

## 2024-03-26 LAB — SURGICAL PATHOLOGY

## 2024-03-27 ENCOUNTER — Encounter (HOSPITAL_COMMUNITY): Payer: Self-pay | Admitting: Gastroenterology

## 2024-04-01 ENCOUNTER — Ambulatory Visit: Payer: Self-pay | Admitting: Gastroenterology

## 2024-04-03 ENCOUNTER — Other Ambulatory Visit: Payer: Self-pay | Admitting: Cardiology

## 2024-04-05 NOTE — Telephone Encounter (Signed)
 Patient said he collected his stool on the 3rd and he brought it back on the 3rd. Paper sent back to [employer] regarding this information

## 2024-04-05 NOTE — Telephone Encounter (Signed)
 Test results placed in box. They say not detected.

## 2024-04-09 NOTE — Telephone Encounter (Signed)
 Patient made aware test was negative for infections

## 2024-04-11 ENCOUNTER — Ambulatory Visit: Admitting: Family Medicine

## 2024-04-23 ENCOUNTER — Ambulatory Visit: Payer: Self-pay

## 2024-04-23 ENCOUNTER — Encounter: Payer: Self-pay | Admitting: Gastroenterology

## 2024-04-23 NOTE — Telephone Encounter (Signed)
 FYI Only or Action Required?: FYI only for provider: home care advised.  Patient was last seen in primary care on 01/23/2024 by Catherine Fuller A, DO.  Called Nurse Triage reporting URI.  Symptoms began a week ago.  Interventions attempted: OTC medications: cough syrup.  Symptoms are: unchanged.  Triage Disposition: Home Care  Patient/caregiver understands and will follow disposition?: Yes  Copied from CRM #[id number]. Topic: Clinical - Red Word Triage >> Apr 23, 2024  3:22 PM Mesmerise C wrote: Red Word that prompted transfer to Nurse Triage: Patient has been coughing up phlegm states he has upper respiratory infection taking prednisone  but no improvement been ongoing for a week now Reason for Disposition  Care advice for mild cough, questions about  Answer Assessment - Initial Assessment Questions 1. ONSET: When did the symptoms start?      1 week ago  2. AMOUNT: How much discharge is there?      Pt unsure, states he swallows his mucus  3. COUGH: Do you have a cough? If Yes, ask: Describe the color of your mucus. (e.g., clear, white, yellow, green)     Yes, unsure what color it is  4. RESPIRATORY DISTRESS: Describe your breathing.      Pt noted to be speaking in full sentences, reports my breathing has gotten better  5. FEVER: Do you have a fever? If Yes, ask: What is your temperature, how was it measured, and when did it start?     Denies  6. SEVERITY: Overall, how bad are you feeling right now? (e.g., doesn't interfere with normal activities, staying home from school/work, staying in bed)      I feel tireder than I should but it's just the coughing.  7. OTHER SYMPTOMS: Do you have any other symptoms? (e.g., earache, mouth sores, sore throat, wheezing) Denies  Flu neg, Covid neg at home tests  Protocols used: Common Cold-A-AH

## 2024-04-26 ENCOUNTER — Ambulatory Visit (INDEPENDENT_AMBULATORY_CARE_PROVIDER_SITE_OTHER): Payer: PPO

## 2024-04-26 DIAGNOSIS — I255 Ischemic cardiomyopathy: Secondary | ICD-10-CM | POA: Diagnosis not present

## 2024-04-29 LAB — CUP PACEART REMOTE DEVICE CHECK
Battery Remaining Longevity: 9 mo
Battery Remaining Percentage: 10 %
Brady Statistic RV Percent Paced: 1 %
Date Time Interrogation Session: 20251226161500
HighPow Impedance: 114 Ohm
Implantable Lead Connection Status: 753985
Implantable Lead Implant Date: 20120913
Implantable Lead Location: 753860
Implantable Lead Model: 293
Implantable Lead Serial Number: 107958
Implantable Pulse Generator Implant Date: 20120913
Lead Channel Impedance Value: 454 Ohm
Lead Channel Pacing Threshold Amplitude: 0.7 V
Lead Channel Pacing Threshold Pulse Width: 0.5 ms
Lead Channel Setting Pacing Amplitude: 2 V
Lead Channel Setting Pacing Pulse Width: 0.5 ms
Lead Channel Setting Sensing Sensitivity: 0.6 mV
Pulse Gen Serial Number: 105130
Zone Setting Status: 755011

## 2024-04-30 ENCOUNTER — Ambulatory Visit: Payer: Self-pay | Admitting: Cardiology

## 2024-05-01 NOTE — Progress Notes (Signed)
 Remote ICD Transmission

## 2024-05-13 NOTE — Progress Notes (Unsigned)
 " [facility] [address] [place] [address] Phone: [phone] Subjective:   LILLETTE [name], am serving as a scribe for Dr. Arthea Claudene.  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  Will get further evaluation if we need to including a CT myelogram.  Patient does have a pacemaker. Discussed with patient about Toradol  and Depo-Medrol .  Discussed with patient that this has been quite significant change.  Being worked up for inflammatory bowel disease.  Discussed different labs such as an HLA-B27 that would be potentially helpful as well.  Likely will get follow-up labs with patient having the anemia from gastroenterology so we will send a note for them to further evaluate as well.  Follow-up with me again in 2 to 3 months.  May need help with further evaluation and treatment with rheumatology depending on findings      Update 05/14/2024 [name] is a 74 y.o. male coming in with complaint of cervical spine pain. Patient states that his neck is better. Still has stiffness in R side.    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   Thrombocytopenia    Unilateral primary osteoarthritis, left hip 12/18/2017   Unilateral primary osteoarthritis, right hip  12/18/2017   Vegan diet    Past Surgical History:  Procedure Laterality Date   APPENDECTOMY     CARDIAC CATHETERIZATION  2012   X2 with 2 stents    COLONOSCOPY N/A 03/25/2024   Procedure: COLONOSCOPY;  Surgeon: Charlanne Groom, MD;  Location: [facility];  Service: Gastroenterology;  Laterality: N/A;   COLONOSCOPY WITH PROPOFOL  N/A 07/18/2022   Procedure: COLONOSCOPY WITH PROPOFOL ;  Surgeon: Charlanne Groom, MD;  Location: [facility];  Service: Gastroenterology;  Laterality: N/A;   CORONARY ARTERY BYPASS GRAFT  2007   quadruple    ICD IMPLANT  2012   JOINT REPLACEMENT  2019   Left Hip   partial discectomy   1995   POLYPECTOMY  07/18/2022   Procedure: POLYPECTOMY;  Surgeon: Charlanne Groom, MD;  Location: [facility];  Service: Gastroenterology;;   SPINAL FUSION  2003   lumbar    TOTAL HIP ARTHROPLASTY Left 02/15/2018   Procedure: LEFT TOTAL HIP ARTHROPLASTY ANTERIOR APPROACH;  Surgeon: Vernetta Lonni GRADE, MD;  Location: [facility];  Service: Orthopedics;  Laterality: Left;   Social History   Socioeconomic History   Marital status: Married    Spouse name: Not on file   Number of children: Not on file   Years of education: Not on file   Highest education level: Some college, no degree  Occupational History   Not on file  Tobacco Use   Smoking status: Never    Passive exposure: Never   Smokeless tobacco: Never  Vaping Use   Vaping status: Never Used  Substance and Sexual Activity   Alcohol use: Not Currently    Comment: occ   Drug use: Never   Sexual activity: Yes    Partners: Female    Birth control/protection: None    Comment: married  Other Topics Concern   Not on file  Social History Narrative   Marital status/children/pets: Married.   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   Safety:      -Wears a bicycle helmet riding a bike: Yes     -smoke alarm in the home:Yes     - wears seatbelt: Yes         Social Drivers of Health   Tobacco Use:  Low Risk (03/25/2024)   Patient History    Smoking Tobacco Use: Never    Smokeless Tobacco Use: Never    Passive Exposure: Never  Financial Resource Strain: Low Risk (12/13/2023)   Overall Financial Resource Strain (CARDIA)    Difficulty of Paying Living Expenses: Not very hard  Food Insecurity: No Food Insecurity (12/13/2023)   Epic    Worried About Programme Researcher, Broadcasting/film/video in the Last Year: Never true    Ran Out of Food in the Last Year: Never true  Transportation Needs: No Transportation Needs (12/13/2023)   Epic    Lack of Transportation (Medical): No    Lack of Transportation (Non-Medical): No  Physical Activity: Insufficiently Active (12/13/2023)   Exercise Vital Sign    Days of Exercise per Week: 3 days    Minutes of Exercise per Session: 30 min  Stress: No Stress Concern Present (12/13/2023)   [employer] of Occupational Health - Occupational Stress Questionnaire    Feeling of Stress: Only a little  Social Connections: Moderately Isolated (12/13/2023)   Social Connection and Isolation Panel    Frequency of Communication with Friends and Family: Once a week    Frequency of Social Gatherings with Friends and Family: Three times a week    Attends Religious Services: Never    Active Member of Clubs or Organizations: No    Attends Banker Meetings: Not on file    Marital Status: Married  Depression (PHQ2-9): Low Risk (12/14/2023)   Depression (PHQ2-9)    PHQ-2 Score: 2  Alcohol Screen: Low Risk (05/15/2023)   Alcohol Screen    Last Alcohol Screening Score (AUDIT): 1  Housing: Unknown (12/13/2023)   Epic    Unable to Pay for Housing in the Last Year: No    Number of Times Moved in the Last Year: Not on file    Homeless in the Last Year: No  Utilities: Not At Risk (01/11/2022)   AHC Utilities    Threatened with loss of utilities: No  Health Literacy: Not on file   Allergies[1] Family History  Problem Relation Age of Onset   Breast cancer Mother    Depression  Mother    Alcohol abuse Mother    Hypertension Father    Prostate cancer Father    Heart disease Father    Thyroid  disease Sister     Current Outpatient Medications (Cardiovascular):    lisinopril  (ZESTRIL ) 5 MG tablet, TAKE 1 TABLET BY MOUTH DAILY   metoprolol  succinate (TOPROL -XL) 25 MG 24 hr tablet, TAKE 1 TABLET BY MOUTH EVERY MORNING AND TAKE 1 TABLET BY MOUTH EVERY NIGHT AT BEDTIME   REPATHA   SURECLICK 140 MG/ML SOAJ, INJECT 140MG  UNDER THE SKIN EVERY 14 DAYS   tadalafil  (CIALIS ) 5 MG tablet, Take 1 tablet (5 mg total) by mouth daily.  Current Outpatient Medications (Analgesics):    aspirin  EC 81 MG tablet, Take 81 mg by mouth daily.  Current Outpatient Medications (Hematological):    Cyanocobalamin  (VITAMIN B-12 PO), Take 2,500 mcg by mouth every other day.  Current Outpatient Medications (Other):    Cholecalciferol 25 MCG (1000 UT) tablet, Take 1,000-2,000 Units by mouth every other day.   Multiple Vitamin (MULTI-VITAMINS) TABS, Take 1 tablet by mouth daily.    OVER THE COUNTER MEDICATION, Take 1 tablet by mouth daily. NMN   Vitamin D-Vitamin K (VITAMIN K2-VITAMIN D3 PO), Take 1 tablet by mouth every other day.   Reviewed prior external information including notes and imaging from  primary care provider As well as notes that were available from care everywhere and other healthcare systems.  Past medical history, social, surgical and family history all reviewed in electronic medical record.  No pertanent information unless stated regarding to the chief complaint.   Review of Systems:  No headache, visual changes, nausea, vomiting, diarrhea, constipation, dizziness, abdominal pain, skin rash, fevers, chills, night sweats, weight loss, swollen lymph nodes, body aches, joint swelling, chest pain,  mood changes. POSITIVE muscle aches, shortness of breath  Objective  Blood pressure 112/60, height 5' 10 (1.778 m), weight 170 lb (77.1 kg).   General: No apparent distress alert  and oriented x3 mood and affect normal, dressed appropriately.  HEENT: Pupils equal, extraocular movements intact  Respiratory: Patient's speak in full sentences and does not appear short of breath  Cardiovascular: Trace edema in the lower extremities.  Patient has irregularly irregular heart beat noted.  Does seem to have a systolic murmur noted mostly of the left lower border. Neck exam shows loss of lordosis.    Impression and Recommendations:     The above documentation has been reviewed and is accurate and complete Amariyana Heacox M Krystofer Hevener, DO       [1]  Allergies Allergen Reactions   Crestor [Rosuvastatin]     myalgias   Lipitor [Atorvastatin]     myalgias   Morphine And Codeine     Nausea   Other Other (See Comments)    VEGAN ( no dairy , no meat, ,no eggs);  Fish 4-5 times a year   Statins     Muscle pain   "

## 2024-05-14 ENCOUNTER — Other Ambulatory Visit: Payer: Self-pay

## 2024-05-14 ENCOUNTER — Emergency Department (HOSPITAL_BASED_OUTPATIENT_CLINIC_OR_DEPARTMENT_OTHER)
Admission: EM | Admit: 2024-05-14 | Discharge: 2024-05-14 | Disposition: A | Discharge status: Home / Self Care | Attending: Emergency Medicine | Admitting: Emergency Medicine

## 2024-05-14 ENCOUNTER — Emergency Department (HOSPITAL_BASED_OUTPATIENT_CLINIC_OR_DEPARTMENT_OTHER): Admitting: Radiology

## 2024-05-14 ENCOUNTER — Ambulatory Visit: Admitting: Family Medicine

## 2024-05-14 VITALS — BP 112/60 | Ht 70.0 in | Wt 170.0 lb

## 2024-05-14 DIAGNOSIS — I493 Ventricular premature depolarization: Secondary | ICD-10-CM | POA: Insufficient documentation

## 2024-05-14 DIAGNOSIS — I499 Cardiac arrhythmia, unspecified: Secondary | ICD-10-CM | POA: Insufficient documentation

## 2024-05-14 DIAGNOSIS — R002 Palpitations: Secondary | ICD-10-CM | POA: Diagnosis present

## 2024-05-14 DIAGNOSIS — Z7982 Long term (current) use of aspirin: Secondary | ICD-10-CM | POA: Diagnosis not present

## 2024-05-14 DIAGNOSIS — Z96642 Presence of left artificial hip joint: Secondary | ICD-10-CM | POA: Insufficient documentation

## 2024-05-14 DIAGNOSIS — I4891 Unspecified atrial fibrillation: Secondary | ICD-10-CM

## 2024-05-14 DIAGNOSIS — I509 Heart failure, unspecified: Secondary | ICD-10-CM | POA: Insufficient documentation

## 2024-05-14 DIAGNOSIS — I110 Hypertensive heart disease with heart failure: Secondary | ICD-10-CM | POA: Diagnosis not present

## 2024-05-14 DIAGNOSIS — Z79899 Other long term (current) drug therapy: Secondary | ICD-10-CM | POA: Diagnosis not present

## 2024-05-14 DIAGNOSIS — R5383 Other fatigue: Secondary | ICD-10-CM

## 2024-05-14 DIAGNOSIS — Z951 Presence of aortocoronary bypass graft: Secondary | ICD-10-CM | POA: Insufficient documentation

## 2024-05-14 DIAGNOSIS — I2510 Atherosclerotic heart disease of native coronary artery without angina pectoris: Secondary | ICD-10-CM | POA: Insufficient documentation

## 2024-05-14 LAB — CBC
HCT: 38.7 % — ABNORMAL LOW (ref 39.0–52.0)
Hemoglobin: 12.6 g/dL — ABNORMAL LOW (ref 13.0–17.0)
MCH: 29.5 pg (ref 26.0–34.0)
MCHC: 32.6 g/dL (ref 30.0–36.0)
MCV: 90.6 fL (ref 80.0–100.0)
Platelets: 181 K/uL (ref 150–400)
RBC: 4.27 MIL/uL (ref 4.22–5.81)
RDW: 13.6 % (ref 11.5–15.5)
WBC: 5.5 K/uL (ref 4.0–10.5)
nRBC: 0 % (ref 0.0–0.2)

## 2024-05-14 LAB — BASIC METABOLIC PANEL WITH GFR
Anion gap: 9 (ref 5–15)
BUN: 23 mg/dL (ref 8–23)
CO2: 27 mmol/L (ref 22–32)
Calcium: 9.7 mg/dL (ref 8.9–10.3)
Chloride: 99 mmol/L (ref 98–111)
Creatinine, Ser: 1.18 mg/dL (ref 0.61–1.24)
GFR, Estimated: >60 mL/min
Glucose, Bld: 104 mg/dL — ABNORMAL HIGH (ref 70–99)
Potassium: 4.6 mmol/L (ref 3.5–5.1)
Sodium: 135 mmol/L (ref 135–145)

## 2024-05-14 LAB — TROPONIN T, HIGH SENSITIVITY: Troponin T High Sensitivity: 19 ng/L (ref 0–19)

## 2024-05-14 NOTE — ED Provider Notes (Signed)
 " [place] [facility] DE[facility]MENT AT [facility] Provider Note   CSN: [id number] Arrival date & time: 05/14/24  1025     Patient presents with: Palpitations   [name] Hall is a 74 y.o. male.   HPI     74 year old male with a history of heart failure with reduced ejection fraction, ischemic cardiomyopathy status post ICD, hypertension, hyperlipidemia, MI, CABG x 4, LV thrombus in 2019 previously treated with Eliquis , OSA, fibromyalgia, history of postoperative atrial fibrillation who presents with concern for fatigue, sent by PCP with concern for possible atrial fibrillation//irregular heart rhythm on physical exam.    Has been having fatigue, was on prednisone  for 6 weeks, was weaned off of it and stopped January 5th  Fatigue over the last year, had GI problems, chronic diarrhea for 3 months, prednisone  helped and stopped that about 8 days ago Then fatigue came back again after prednisone  stopped Went down to 5mg  tablets prior to it being stopped Has defibrillator Rare occasions would stand up and feel lightheadedness, no chest pain or dyspnea Diarrhea still improved  No fevers, no cough, no black or bloody stools  No swelling in legs or dyspnea laying flat  No palpitations No syncope or defibrillator PCP was listening and sounded irregular, concern for possible afib and sent to ED  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 Authorizing Provider  aspirin  EC 81 MG tablet Take 81 mg by mouth daily.    [provider]  Cholecalciferol 25 MCG (1000 UT) tablet Take 1,000-2,000 Units by mouth every other day.    [provider]  Cyanocobalamin  (VITAMIN B-12 PO) Take 2,500 mcg by mouth every other day.    [provider]  lisinopril  (ZESTRIL ) 5 MG tablet TAKE 1 TABLET BY MOUTH DAILY 01/08/24   Lonni Slain, MD  metoprolol  succinate (TOPROL -XL) 25 MG 24 hr tablet TAKE 1 TABLET BY MOUTH EVERY MORNING AND TAKE 1 TABLET BY MOUTH EVERY NIGHT AT BEDTIME 12/15/23   Aniceto Daphne CROME, NP  Multiple Vitamin (MULTI-VITAMINS) TABS Take 1 tablet by mouth daily.     [provider]  OVER THE COUNTER MEDICATION Take 1 tablet by mouth daily. NMN    [provider]  REPATHA  SURECLICK 140 MG/ML SOAJ INJECT 140MG  UNDER THE SKIN EVERY 14 DAYS 04/04/24   Lonni Slain, MD  tadalafil  (CIALIS ) 5 MG tablet Take 1 tablet (5 mg total) by mouth daily. 12/14/23   Kuneff, Renee A, DO  Vitamin D-Vitamin K (VITAMIN K2-VITAMIN D3 PO) Take 1 tablet by mouth every other day.  [provider]    Allergies: Crestor [rosuvastatin], Lipitor [atorvastatin], Morphine and codeine, Other, and Statins    Review of Systems  Updated Vital Signs BP 106/61   Pulse 70   Temp 97.9 F (36.6 C) (Oral)   Resp 18   SpO2 100%   Physical Exam Vitals and nursing note reviewed.  Constitutional:      General: He is not in acute distress.    Appearance: He is well-developed. He is not diaphoretic.  HENT:     Head: Normocephalic and atraumatic.  Eyes:     Conjunctiva/sclera: Conjunctivae normal.  Cardiovascular:     Rate and Rhythm: Normal rate and regular  rhythm.     Heart sounds: Normal heart sounds. No murmur heard.    No friction rub. No gallop.  Pulmonary:     Effort: Pulmonary effort is normal. No respiratory distress.     Breath sounds: Normal breath sounds. No wheezing or rales.  Abdominal:     General: There is no distension.     Palpations: Abdomen is soft.     Tenderness: There is no abdominal tenderness. There is no guarding.  Musculoskeletal:     Cervical back: Normal range of motion.  Skin:    General: Skin is warm and dry.  Neurological:     Mental Status: He is alert and oriented to person, place, and time.     (all labs ordered are listed, but only abnormal results are displayed) Labs Reviewed  BASIC METABOLIC PANEL WITH GFR - Abnormal; Notable for the following components:      Result Value   Glucose, Bld 104 (*)    All other components within normal limits  CBC - Abnormal; Notable for the following components:   Hemoglobin 12.6 (*)    HCT 38.7 (*)    All other components within normal limits  TROPONIN T, HIGH SENSITIVITY  TROPONIN T, HIGH SENSITIVITY    EKG: EKG Interpretation Date/Time:  Tuesday May 14 2024 10:32:04 EST Ventricular Rate:  74 PR Interval:  170 QRS Duration:  126 QT Interval:  403 QTC Calculation: 448 R Axis:   114  Text Interpretation: Sinus rhythm Consider left atrial enlargement Nonspecific intraventricular conduction delay Probable anteroseptal infarct, old Abnormal T, consider ischemia, diffuse leads Since prior ECG, no significant change Confirmed by Dreama Longs ([id number]) on 05/14/2024 11:31:05 AM  Radiology: [facility] Chest 2 View Result Date: 05/14/2024 CLINICAL DATA:  Chest pain with palpitations and fatigue. EXAM: CHEST - 2 VIEW COMPARISON:  06/09/2009 FINDINGS: Sternotomy wires unchanged. Left-sided pacemaker present. Multiple surgical clips project over the left hilar/perihilar region. Lungs are adequately inflated and demonstrate minimal left base density likely atelectasis  and possible small amount of pleural fluid versus pleuroparenchymal scarring. No acute airspace process. Right lung is clear. Cardiomediastinal silhouette and remainder of the exam is unchanged. IMPRESSION: Minimal left base density likely atelectasis and possible small amount of pleural fluid versus pleuroparenchymal scarring. Electronically Signed   By: Toribio Agreste M.D.   On: 05/14/2024 11:44     Procedures   Medications Ordered in the ED - No data to display                                   74 year old male with a history of heart failure with reduced ejection fraction, ischemic cardiomyopathy status post ICD, hypertension, hyperlipidemia, MI, CABG x 4, LV thrombus in 2019 previously treated with Eliquis ,  OSA, fibromyalgia, history of postoperative atrial fibrillation who presents with concern for fatigue, sent by PCP with concern for possible atrial fibrillation//irregular heart rhythm on physical exam.    EKG completed and personally evaluated interpreted by me today shows a sinus rhythm without significant change compared to prior.  Cardiac monitoring does show PVCs, suspect his physician was hearing PVCs as irregularity.   CXR was completed and evaluated by me and radiology shows likely atelectasis, possible pleural fluid or pleuroparenchymal scarring.   Labs are completed and personally about interpreted by me show no clinically significant electrolyte abnormalities, stable mild anemia, no leukocytosis.  His troponin is normal and have low suspicion for ACS.  Blood pressures stable in ED.  Doubt significant hypothyroidism or adrenal insufficiency. Recommend continued follow up with PCP and Cardiology regarding fatigue. Patient discharged in stable condition with understanding of reasons to return.      Final diagnoses:  Other fatigue  PVCs (premature ventricular contractions)    ED Discharge Orders     None          Dreama Longs, MD 05/14/24 2218  "

## 2024-05-14 NOTE — ED Triage Notes (Signed)
 Reports palpations and increased fatigue. Seen at cardiologist and was told new onset afib.

## 2024-05-14 NOTE — ED Notes (Signed)
 Patient transported to X-ray

## 2024-05-14 NOTE — Patient Instructions (Addendum)
 Please head to [facility] We will schedule in 2 months but call us  later this week

## 2024-05-14 NOTE — Assessment & Plan Note (Signed)
 Patient has what appears to be atrial fibrillation.  Referred patient to the emergency room.  Patient is not having any chest pain, no dizziness or lightheadedness and has been going on for some time.  Does have an ICD but has not had anything to go off.  Referred to emergency room with this and low blood pressure for further evaluation.  Patient felt safe enough to drive.  Follow-up with me again for his other problems after further evaluation of this cardiac pathology.

## 2024-05-14 NOTE — ED Notes (Signed)
 Getting changed.

## 2024-05-14 NOTE — ED Notes (Signed)
Discharge reviewed by provider.

## 2024-05-29 ENCOUNTER — Encounter (HOSPITAL_BASED_OUTPATIENT_CLINIC_OR_DEPARTMENT_OTHER): Payer: Self-pay | Admitting: Cardiology

## 2024-05-30 ENCOUNTER — Telehealth: Payer: Self-pay | Admitting: Pharmacy Technician

## 2024-05-30 ENCOUNTER — Other Ambulatory Visit (HOSPITAL_COMMUNITY): Payer: Self-pay

## 2024-05-30 NOTE — Telephone Encounter (Signed)
 Pharmacy Patient Advocate Encounter  Received notification from [employer] that Prior Authorization for repatha  has been APPROVED from 05/02/24 to 05/01/25   PA #/Case ID/Reference #: [id number]

## 2024-05-30 NOTE — Telephone Encounter (Signed)
" ° °  Pharmacy Patient Advocate Encounter   Received notification from Patient Advice Request messages that prior authorization for repatha  is required/requested.   Insurance verification completed.   The patient is insured through [employer].   Per test claim: PA required; PA submitted to above mentioned insurance via Latent Key/confirmation #/EOC [id number] Status is pending  "

## 2024-06-05 NOTE — Progress Notes (Signed)
 " Cardiology Office Note:  .   Date:  06/06/2024  ID:  [name] Hall, DOB 19-Jan-1951, MRN [id number] PCP: Catherine Charlies LABOR, DO  [facility] Providers Cardiologist:  Shelda Bruckner, MD Electrophysiologist:  Will Gladis Norton, MD {  History of Present Illness: .   [name] Hall is a 74 y.o. male  with a hx of CAD, ischemic cardiomyopathy, fibromyalgia, HLD with statin intolerance, sleep apnea, Myocardial Infarction (2012) s/p CABG x4, hypotension who is seen for follow-up. He was initially seen 12/29/20 as a new consult at the request of Kuneff, Renee A, DO for the evaluation and management of Ischemic cardiomyopathy.   CV history: Current diet: Vegan diet for >10 years, now gluten free to see if this helps his GI symptoms, eating meat to try to regain muscle mass 2019: LV thrombus on echo, placed on apixaban . Did not have immediate repeat study. We repeated in 09/2020, no thrombus, apixaban  stopped. Repeat echo without thrombus.  Today: Blood pressure running low. In the office today his BP is 90/48 and feels ok, but at home he has seen lower numbers and feels lightheaded. No syncope. Readings in the 80s/40s as lows, frequently 90s/50s. Doesn't have a lot of energy. Felt better when he was on prednisone  for 6 weeks from his colitis, but since that was stopped 1/5 his fatigue came back. He went to the ER 05/14/24 after seeing Dr. Claudene, who was concerned that he could be in afib. Telemetry showed PVCs intermittently, but no afib.   We reviewed his symptoms and medications, discussed next steps, see below.  ROS: Denies chest pain, shortness of breath at rest. No PND, orthopnea, LE edema or unexpected weight gain (has had ~26 lbs of weight loss). No syncope or palpitations. ROS otherwise negative except as noted.   Studies Reviewed: SABRA    EKG:       Physical Exam:   VS:  BP (!) 90/48 (BP Location: Right Arm, Patient Position: Sitting, Cuff Size: Normal)   Pulse 70   Ht 5'  10 (1.778 m)   Wt 172 lb 4.8 oz (78.2 kg)   SpO2 97%   BMI 24.72 kg/m    Wt Readings from Last 3 Encounters:  06/06/24 172 lb 4.8 oz (78.2 kg)  05/14/24 170 lb (77.1 kg)  03/25/24 160 lb (72.6 kg)    GEN: Well nourished, well developed in no acute distress HEENT: Normal, moist mucous membranes NECK: No JVD CARDIAC: regular rhythm, normal S1 and S2, no rubs or gallops. No murmur. VASCULAR: Radial and DP pulses palpable bilaterally. No carotid bruits RESPIRATORY:  Clear to auscultation without rales, wheezing or rhonchi  ABDOMEN: Soft, non-tender, non-distended MUSCULOSKELETAL:  Ambulates independently SKIN: Warm and dry, no edema NEUROLOGIC:  Alert and oriented x 3. No focal neuro deficits noted. PSYCHIATRIC:  Normal affect    ASSESSMENT AND PLAN: .    CAD with prior CABG 2007 (LIMA-LAD, SVG-RCA, SVG-PDA, SVG-Diagonal) STEMI 2012 (LAD, s/p PCI distal to LIMA site and staged Lcx stent same admission) Hypercholesterolemia, LDL goal <70, with statin intolerance Ischemic cardiomyopathy, s/p ICD PVCs Worsening hypotension -continue aspirin  81 mg daily -has not tolerated multiple statins. Stopped repatha  for a time given his GI issues, Now back on repatha , tolerating. -NYHA class II-III, largely fatigue/lightheadedness -on metoprolol  succinate 25 mg BID. Has ICD in place.  -on lisinopril  5 mg daily, has had some chronic low blood pressure but now he is intermittently symptomatic. Stop lisinopril  today. Did not tolerate spironolactone in  the past due to hyperkalemia. No blood pressure room for entresto -had brain fog on farxiga, declines additional trial or trial of Jardiance -last EF 20-25% with akinetic apex and general hypokinesis otherwise in 03/2021, dilated LV, G2DD.  -we have been discussing when to recheck echo--he had previously wanted to try to increase activity first, was not urgent given that his EF had been low for >10 years, but with now needing to cut back on meds and  having a lot of fatigue, will recheck -PVCs noted during recent ER visit. Would like to continue metoprolol  if possible -reviewed red flags that need ER evaluation   History of LV thrombus 2019 -rechecked echo, no thrombus. Stopped anticoagulation, no thrombus on repeat echo  Dispo: 6 weeks or sooner as needed to monitor response  Signed, Shelda Bruckner, MD   Shelda Bruckner, MD, PhD, [facility]  [facility]  [place]  [facility] at [facility] [address] [place], [place] [address] [phone]   "

## 2024-06-06 ENCOUNTER — Ambulatory Visit (HOSPITAL_BASED_OUTPATIENT_CLINIC_OR_DEPARTMENT_OTHER): Admitting: Cardiology

## 2024-06-06 ENCOUNTER — Encounter (HOSPITAL_BASED_OUTPATIENT_CLINIC_OR_DEPARTMENT_OTHER): Payer: Self-pay | Admitting: Cardiology

## 2024-06-06 VITALS — BP 90/48 | HR 70 | Ht 70.0 in | Wt 172.3 lb

## 2024-06-06 DIAGNOSIS — I959 Hypotension, unspecified: Secondary | ICD-10-CM

## 2024-06-06 DIAGNOSIS — Z951 Presence of aortocoronary bypass graft: Secondary | ICD-10-CM

## 2024-06-06 DIAGNOSIS — Z95810 Presence of automatic (implantable) cardiac defibrillator: Secondary | ICD-10-CM

## 2024-06-06 DIAGNOSIS — I2510 Atherosclerotic heart disease of native coronary artery without angina pectoris: Secondary | ICD-10-CM | POA: Diagnosis not present

## 2024-06-06 DIAGNOSIS — Z789 Other specified health status: Secondary | ICD-10-CM | POA: Diagnosis not present

## 2024-06-06 DIAGNOSIS — E7800 Pure hypercholesterolemia, unspecified: Secondary | ICD-10-CM | POA: Diagnosis not present

## 2024-06-06 DIAGNOSIS — I255 Ischemic cardiomyopathy: Secondary | ICD-10-CM

## 2024-06-06 NOTE — Patient Instructions (Signed)
 STOP lisinopril  today as your blood pressure has dropped further and you are now feeling lightheaded on occasion. We do not want it to be so low that you pass out.    Medication Instructions:  Stop lisinopril  *If you need a refill on your cardiac medications before your next appointment, please call your pharmacy*  Lab Work: None ordered  Testing/Procedures: Your physician has requested that you have an echocardiogram. Echocardiography is a painless test that uses sound waves to create images of your heart. It provides your doctor with information about the size and shape of your heart and how well your hearts chambers and valves are working. This procedure takes approximately one hour. There are no restrictions for this procedure. Please do NOT wear cologne, perfume, aftershave, or lotions (deodorant is allowed). Please arrive 15 minutes prior to your appointment time.  Please note: We ask at that you not bring children with you during ultrasound (echo/ vascular) testing. Due to room size and safety concerns, children are not allowed in the ultrasound rooms during exams. Our front office staff cannot provide observation of children in our lobby area while testing is being conducted. An adult accompanying a patient to their appointment will only be allowed in the ultrasound room at the discretion of the ultrasound technician under special circumstances. We apologize for any inconvenience.   Follow-Up: At [facility], you and your health needs are our priority.  As part of our continuing mission to provide you with exceptional heart care, our providers are all part of one team.  This team includes your primary Cardiologist (physician) and Advanced Practice Providers or APPs (Physician Assistants and Nurse Practitioners) who all work together to provide you with the care you need, when you need it.  Your next appointment:   6 week(s)  Provider:   Shelda Bruckner, MD, Rosaline Bane, NP, or Reche Finder, NP

## 2024-06-07 ENCOUNTER — Other Ambulatory Visit: Payer: Self-pay | Admitting: Pulmonary Disease

## 2024-06-07 MED ORDER — METOPROLOL SUCCINATE ER 25 MG PO TB24: 25.0000 mg | ORAL_TABLET | Freq: Two times a day (BID) | ORAL | 1 refills | Status: AC

## 2024-07-08 ENCOUNTER — Other Ambulatory Visit (HOSPITAL_BASED_OUTPATIENT_CLINIC_OR_DEPARTMENT_OTHER)

## 2024-07-09 ENCOUNTER — Other Ambulatory Visit (HOSPITAL_BASED_OUTPATIENT_CLINIC_OR_DEPARTMENT_OTHER)

## 2024-07-16 ENCOUNTER — Inpatient Hospital Stay: Admitting: Hematology and Oncology

## 2024-07-16 ENCOUNTER — Inpatient Hospital Stay

## 2024-07-24 ENCOUNTER — Ambulatory Visit (HOSPITAL_BASED_OUTPATIENT_CLINIC_OR_DEPARTMENT_OTHER): Admitting: Cardiology

## 2024-07-26 ENCOUNTER — Ambulatory Visit: Payer: Self-pay

## 2024-10-25 ENCOUNTER — Ambulatory Visit: Payer: Self-pay

## 2024-12-17 ENCOUNTER — Ambulatory Visit: Admitting: Family Medicine
